# Patient Record
Sex: Female | Born: 1985 | Race: White | Hispanic: No | Marital: Married | Smoking: Never smoker
Health system: Southern US, Community
[De-identification: ages and names within clinical notes are randomized; demographics above are authoritative.]

## PROBLEM LIST (undated history)

## (undated) DIAGNOSIS — K219 Gastro-esophageal reflux disease without esophagitis: Secondary | ICD-10-CM

## (undated) DIAGNOSIS — Z803 Family history of malignant neoplasm of breast: Secondary | ICD-10-CM

## (undated) DIAGNOSIS — E039 Hypothyroidism, unspecified: Secondary | ICD-10-CM

## (undated) DIAGNOSIS — Z8719 Personal history of other diseases of the digestive system: Secondary | ICD-10-CM

## (undated) DIAGNOSIS — E282 Polycystic ovarian syndrome: Secondary | ICD-10-CM

## (undated) DIAGNOSIS — F419 Anxiety disorder, unspecified: Secondary | ICD-10-CM

## (undated) DIAGNOSIS — Z8489 Family history of other specified conditions: Secondary | ICD-10-CM

## (undated) DIAGNOSIS — I10 Essential (primary) hypertension: Secondary | ICD-10-CM

## (undated) HISTORY — DX: Essential (primary) hypertension: I10

## (undated) HISTORY — DX: Polycystic ovarian syndrome: E28.2

## (undated) HISTORY — DX: Family history of malignant neoplasm of breast: Z80.3

## (undated) HISTORY — DX: Gastro-esophageal reflux disease without esophagitis: K21.9

---

## 2015-04-01 DIAGNOSIS — Z3041 Encounter for surveillance of contraceptive pills: Secondary | ICD-10-CM | POA: Insufficient documentation

## 2015-04-01 DIAGNOSIS — E282 Polycystic ovarian syndrome: Secondary | ICD-10-CM | POA: Insufficient documentation

## 2015-04-01 DIAGNOSIS — K219 Gastro-esophageal reflux disease without esophagitis: Secondary | ICD-10-CM | POA: Insufficient documentation

## 2015-04-01 DIAGNOSIS — I10 Essential (primary) hypertension: Secondary | ICD-10-CM | POA: Insufficient documentation

## 2017-04-05 ENCOUNTER — Encounter: Payer: Self-pay | Admitting: Family Medicine

## 2017-04-05 ENCOUNTER — Ambulatory Visit: Payer: BC Managed Care – PPO | Admitting: Family Medicine

## 2017-04-05 ENCOUNTER — Other Ambulatory Visit: Payer: Self-pay

## 2017-04-05 VITALS — BP 130/86 | HR 75 | Temp 98.3°F | Ht 67.0 in | Wt 263.4 lb

## 2017-04-05 DIAGNOSIS — E282 Polycystic ovarian syndrome: Secondary | ICD-10-CM | POA: Diagnosis not present

## 2017-04-05 DIAGNOSIS — Z0000 Encounter for general adult medical examination without abnormal findings: Secondary | ICD-10-CM | POA: Diagnosis not present

## 2017-04-05 DIAGNOSIS — I10 Essential (primary) hypertension: Secondary | ICD-10-CM | POA: Diagnosis not present

## 2017-04-05 DIAGNOSIS — Z803 Family history of malignant neoplasm of breast: Secondary | ICD-10-CM | POA: Diagnosis not present

## 2017-04-05 HISTORY — DX: Family history of malignant neoplasm of breast: Z80.3

## 2017-04-05 LAB — LIPID PANEL
Cholesterol: 185 mg/dL (ref 0–200)
HDL: 31.5 mg/dL — AB (ref >39.00)
NonHDL: 153.64
TRIGLYCERIDES: 211 mg/dL — AB (ref 0.0–149.0)
Total CHOL/HDL Ratio: 6
VLDL: 42.2 mg/dL — ABNORMAL HIGH (ref 0.0–40.0)

## 2017-04-05 LAB — COMPREHENSIVE METABOLIC PANEL
ALT: 34 U/L (ref 0–35)
AST: 21 U/L (ref 0–37)
Albumin: 3.9 g/dL (ref 3.5–5.2)
Alkaline Phosphatase: 64 U/L (ref 39–117)
BILIRUBIN TOTAL: 0.5 mg/dL (ref 0.2–1.2)
BUN: 11 mg/dL (ref 6–23)
CHLORIDE: 100 meq/L (ref 96–112)
CO2: 29 meq/L (ref 19–32)
Calcium: 9 mg/dL (ref 8.4–10.5)
Creatinine, Ser: 0.98 mg/dL (ref 0.40–1.20)
GFR: 70.08 mL/min (ref >60.00)
GLUCOSE: 79 mg/dL (ref 70–99)
Potassium: 4 mEq/L (ref 3.5–5.1)
Sodium: 138 mEq/L (ref 135–145)
Total Protein: 7 g/dL (ref 6.0–8.3)

## 2017-04-05 LAB — TSH: TSH: 3.06 u[IU]/mL (ref 0.35–4.50)

## 2017-04-05 LAB — LDL CHOLESTEROL, DIRECT: LDL DIRECT: 117 mg/dL

## 2017-04-05 LAB — HEMOGLOBIN A1C: HEMOGLOBIN A1C: 5.6 % (ref 4.6–6.5)

## 2017-04-05 MED ORDER — HYDROCHLOROTHIAZIDE 25 MG PO TABS: 25.0000 mg | ORAL_TABLET | Freq: Every day | ORAL | 3 refills | Status: DC

## 2017-04-05 MED ORDER — LISINOPRIL 20 MG PO TABS: 20.0000 mg | ORAL_TABLET | Freq: Every day | ORAL | 3 refills | Status: DC

## 2017-04-05 NOTE — Patient Instructions (Addendum)
It was so good seeing you again! Thank you for establishing with my new practice and allowing me to continue caring for you. It means a lot to me.   Please schedule a follow up appointment with me in 6 months to recheck your blood pressure.   Please do these things to maintain good health!   Exercise at least 30-45 minutes a day,  4-5 days a week.   Eat a low-fat diet with lots of fruits and vegetables, up to 7-9 servings per day.  Drink plenty of water daily. Try to drink 8 8oz glasses per day.  Seatbelts can save your life. Always wear your seatbelt.  Place Smoke Detectors on every level of your home and check batteries every year.  Schedule an appointment with an eye doctor for an eye exam every 1-2 years  Safe sex - use condoms to protect yourself from STDs if you could be exposed to these types of infections. Use birth control if you do not want to become pregnant and are sexually active.  Avoid heavy alcohol use. If you drink, keep it to less than 2 drinks/day and not every day.  Health Care Power of Attorney.  Choose someone you trust that could speak for you if you became unable to speak for yourself.  Depression is common in our stressful world.If you're feeling down or losing interest in things you normally enjoy, please come in for a visit.  If anyone is threatening or hurting you, please get help. Physical or Emotional Violence is never OK.   

## 2017-04-05 NOTE — Progress Notes (Signed)
Subjective  Chief Complaint  Patient presents with  . Establish Care    Previous Patient from [place]  . Annual Exam  . Medication Refill    HCTZ and Lisinopril     HPI: Sabrina White is a 32 y.o. female who presents to [place] [facility] today for a Female Wellness Visit.  She is a former [place] patient and is here to reestablish care with me today.   She also has the concerns and/or needs as listed above in the chief complaint. These will be addressed in addition to the Health Maintenance Visit.   Wellness Visit: annual visit with health maintenance review and exam without Pap   Had female wellness exam in December with normal Pap smear.  She and her obstetrician working on weight loss and started about a week prior to initiation of IUI trials.  She still would like to become pregnant.  Family history of breast cancer: Maternal side is never been genetically tested.  Breast cancer in mom at age 66 premenopausal, and then grandmother when she was older.  She did have breast cancer in her paternal aunt as well and she fortunately was tested negative for the BRCA gene.  Exercising regularly with CrossFit work [employer].  Diet is fair.  Struggles to lose weight due to PCO S and metabolic syndrome.  Lifestyle: Body mass index is 41.25 kg/m. Wt Readings from Last 3 Encounters:  04/05/17 263 lb 6.4 oz (119.5 kg)   Diet: low fat Exercise: frequently, crossfit Need for contraception: No,   Chronic disease management visit and/or acute problem visit: Hypertension f/u: Control is good . Pt reports she is doing well. taking medications as instructed, no medication side effects noted, no TIAs, no chest pain on exertion, no dyspnea on exertion, no swelling of ankles.  Home readings have been normal but she ran out of her medication last week.  Denies adverse effects from his BP medications. Compliance with medication is good.  We discussed the need to start  medications before getting pregnant and she is aware of this.  Likely will change to labetalol prior to IUI.  Patient is agreeable to this.  BP Readings from Last 3 Encounters:  04/05/17 130/86   Wt Readings from Last 3 Encounters:  04/05/17 263 lb 6.4 oz (119.5 kg)    Patient Active Problem List   Diagnosis Date Noted  . Family history of breast cancer in mother 04/05/2017  . Morbid obesity due to excess calories ([place]) 06/24/2015  . Essential hypertension 04/01/2015  . Gastroesophageal reflux disease without esophagitis 04/01/2015  . PCOS (polycystic ovarian syndrome) 04/01/2015   Health Maintenance  Topic Date Due  . HIV Screening  08/26/2000  . PAP SMEAR  02/04/2020  . TETANUS/TDAP  04/27/2020  . INFLUENZA VACCINE  Completed   Immunization History  Administered Date(s) Administered  . Influenza, Seasonal, Injecte, Preservative Fre 01/24/2015  . Tdap 04/28/2010   We updated and reviewed the patient's past history in detail and it is documented below. Allergies: Patient  reports that she drinks alcohol. Past Medical History Patient  has a past medical history of Family history of breast cancer in mother (04/05/2017), GERD (gastroesophageal reflux disease), Hypertension, and PCOS (polycystic ovarian syndrome). Past Surgical History Patient  has no past surgical history on file. Family History family history includes Breast cancer in her maternal grandmother, mother, and paternal aunt; Hypertension in her father. Social History Patient  reports that  has never smoked. she has never  used smokeless tobacco. She reports that she drinks alcohol. She reports that she does not use drugs.  Review of Systems: Constitutional: negative for fever or malaise Ophthalmic: negative for photophobia, double vision or loss of vision Cardiovascular: negative for chest pain, dyspnea on exertion, or new LE swelling Respiratory: negative for SOB or persistent cough Gastrointestinal: negative  for abdominal pain, change in bowel habits or melena Genitourinary: negative for dysuria or gross hematuria, no abnormal uterine bleeding or disharge Musculoskeletal: negative for new gait disturbance or muscular weakness Integumentary: negative for new or persistent rashes, no breast lumps Neurological: negative for TIA or stroke symptoms Psychiatric: negative for SI or delusions Allergic/Immunologic: negative for hives  Patient Care Team    Relationship Specialty Notifications Start End  Leamon Arnt, MD PCP - General Family Medicine  04/05/17     Objective  Vitals: BP 130/86   Pulse 75   Temp 98.3 F (36.8 C)   Ht 5' 7" (1.702 m)   Wt 263 lb 6.4 oz (119.5 kg)   BMI 41.25 kg/m  General:  Well developed, well nourished, no acute distress  Psych:  Alert and orientedx3,normal mood and affect HEENT:  Normocephalic, atraumatic, non-icteric sclera, PERRL, oropharynx is clear without mass or exudate, supple neck without adenopathy, mass or thyromegaly Cardiovascular:  Normal S1, S2, RRR without gallop, rub or murmur, nondisplaced PMI Respiratory:  Good breath sounds bilaterally, CTAB with normal respiratory effort Gastrointestinal: normal bowel sounds, soft, non-tender, no noted masses. No HSM MSK: no deformities, contusions. Joints are without erythema or swelling. Spine and CVA region are nontender Skin:  Warm, no rashes or suspicious lesions noted Neurologic:    Mental status is normal. CN 2-11 are normal. Gross motor and sensory exams are normal. Normal gait. No tremor   Assessment  1. Annual physical exam   2. Essential hypertension   3. PCOS (polycystic ovarian syndrome)   4. Morbid obesity due to excess calories ([place])   5. Family history of breast cancer in mother      Plan  Female Wellness Visit:  Age appropriate Health Maintenance and Prevention measures were discussed with patient. Included topics are cancer screening recommendations, ways to keep healthy (see AVS)  including dietary and exercise recommendations, regular eye and dental care, use of seat belts, and avoidance of moderate alcohol use and tobacco use.  Discussed consideration for high risk breast cancer clinic for genetic testing to discuss her need for genetic testing and for earlier than recommended screening.  Patient to discuss with OB as well.  BMI: discussed patient's BMI and encouraged positive lifestyle modifications to help get to or maintain a target BMI.  Consider Saxenda.  Patient to discuss with OB.  HM needs and immunizations were addressed and ordered. See below for orders. See HM and immunization section for updates.  Routine labs and screening tests ordered including cmp, cbc and lipids where appropriate.  Discussed recommendations regarding Vit D and calcium supplementation (see AVS)  Chronic disease f/u and/or acute problem visit: (deemed necessary to be done in addition to the wellness visit):  Hypertension: Well controlled.  Refilled medications.  We will continue these over the next 6-12 months.  We will need to change when she starts trying to become pregnant.  Follow up: Return in about 6 months (around 10/03/2017) for follow up Hypertension.    Commons side effects, risks, benefits, and alternatives for medications and treatment plan prescribed today were discussed, and the patient expressed understanding of the given instructions.  Patient is instructed to call or message via MyChart if he/she has any questions or concerns regarding our treatment plan. No barriers to understanding were identified. We discussed Red Flag symptoms and signs in detail. Patient expressed understanding regarding what to do in case of urgent or emergency type symptoms.   Medication list was reconciled, printed and provided to the patient in AVS. Patient instructions and summary information was reviewed with the patient as documented in the AVS. This note was prepared with assistance of Dragon  voice recognition software. Occasional wrong-word or sound-a-like substitutions may have occurred due to the inherent limitations of voice recognition software  Orders Placed This Encounter  Procedures  . Comprehensive metabolic panel  . Lipid panel  . HIV antibody  . TSH  . Hemoglobin A1c  . CBC with Differential/Platelet   Meds ordered this encounter  Medications  . lisinopril (PRINIVIL,ZESTRIL) 20 MG tablet    Sig: Take 1 tablet (20 mg total) by mouth daily.    Dispense:  90 tablet    Refill:  3  . hydrochlorothiazide (HYDRODIURIL) 25 MG tablet    Sig: Take 1 tablet (25 mg total) by mouth daily.    Dispense:  90 tablet    Refill:  3

## 2017-04-06 LAB — CBC WITH DIFFERENTIAL/PLATELET
Basophils Absolute: 86 cells/uL (ref 0–200)
Basophils Relative: 0.8 %
EOS ABS: 756 {cells}/uL — AB (ref 15–500)
EOS PCT: 7 %
HCT: 41.4 % (ref 35.0–45.0)
Hemoglobin: 14.2 g/dL (ref 11.7–15.5)
Lymphs Abs: 3218 cells/uL (ref 850–3900)
MCH: 28.3 pg (ref 27.0–33.0)
MCHC: 34.3 g/dL (ref 32.0–36.0)
MCV: 82.6 fL (ref 80.0–100.0)
MONOS PCT: 5.3 %
MPV: 10.2 fL (ref 7.5–12.5)
Neutro Abs: 6167 cells/uL (ref 1500–7800)
Neutrophils Relative %: 57.1 %
PLATELETS: 283 10*3/uL (ref 140–400)
RBC: 5.01 10*6/uL (ref 3.80–5.10)
RDW: 13 % (ref 11.0–15.0)
TOTAL LYMPHOCYTE: 29.8 %
WBC mixed population: 572 cells/uL (ref 200–950)
WBC: 10.8 10*3/uL (ref 3.8–10.8)

## 2017-04-06 LAB — HIV ANTIBODY (ROUTINE TESTING W REFLEX): HIV: NONREACTIVE

## 2017-06-18 ENCOUNTER — Ambulatory Visit: Payer: BC Managed Care – PPO | Admitting: Family Medicine

## 2017-06-18 ENCOUNTER — Other Ambulatory Visit: Payer: Self-pay

## 2017-06-18 ENCOUNTER — Encounter: Payer: Self-pay | Admitting: Family Medicine

## 2017-06-18 VITALS — BP 132/92 | HR 87 | Temp 98.2°F | Ht 67.0 in | Wt 258.2 lb

## 2017-06-18 DIAGNOSIS — J9801 Acute bronchospasm: Secondary | ICD-10-CM | POA: Diagnosis not present

## 2017-06-18 DIAGNOSIS — J208 Acute bronchitis due to other specified organisms: Secondary | ICD-10-CM

## 2017-06-18 DIAGNOSIS — B9689 Other specified bacterial agents as the cause of diseases classified elsewhere: Secondary | ICD-10-CM | POA: Diagnosis not present

## 2017-06-18 MED ORDER — AZITHROMYCIN 250 MG PO TABS: ORAL_TABLET | ORAL | 0 refills | Status: DC

## 2017-06-18 MED ORDER — ALBUTEROL SULFATE HFA 108 (90 BASE) MCG/ACT IN AERS: 2.0000 | INHALATION_SPRAY | RESPIRATORY_TRACT | 2 refills | Status: DC | PRN

## 2017-06-18 NOTE — Progress Notes (Signed)
Subjective  CC:  Chief Complaint  Patient presents with  . Cough    productive cough with yellow mucus, nasal congestion x 5 days     HPI: SUBJECTIVE:  Sabrina White is a 32 y.o. female who complains of congestion, nasal blockage, post nasal drip, cough described as productive and denies sinus, high fevers, SOB, chest pain or significant GI symptoms. She does have chest tightness and some wheezing. Has had WARI in past. Symptoms have been present for 5 days. Started while in [place]: just got married. She denies a history of anorexia, dizziness, vomiting and wheezing. She denies a history of asthma or COPD. Patient does not smoke cigarettes.  I reviewed the patients updated PMH, FH, and SocHx.  Social History: Patient  reports that she has never smoked. She has never used smokeless tobacco. She reports that she drinks alcohol. She reports that she does not use drugs.  Patient Active Problem List   Diagnosis Date Noted  . Family history of breast cancer in mother 04/05/2017  . Morbid obesity due to excess calories ([facility]) 06/24/2015  . Essential hypertension 04/01/2015  . Gastroesophageal reflux disease without esophagitis 04/01/2015  . PCOS (polycystic ovarian syndrome) 04/01/2015    Review of Systems: Cardiovascular: negative for chest pain Respiratory: negative for SOB or hemoptysis Gastrointestinal: negative for abdominal pain Genitourinary: negative for dysuria or gross hematuria Current Meds  Medication Sig  . BELVIQ 10 MG TABS   . hydrochlorothiazide (HYDRODIURIL) 25 MG tablet Take 1 tablet (25 mg total) by mouth daily.  Marland Kitchen lisinopril (PRINIVIL,ZESTRIL) 20 MG tablet Take 1 tablet (20 mg total) by mouth daily.  Marland Kitchen omeprazole (PRILOSEC) 20 MG capsule Take 20 mg by mouth daily.    Objective  Vitals: BP (!) 132/92   Pulse 87   Temp 98.2 F (36.8 C)   Ht 5\' 7"  (1.702 m)   Wt 258 lb 3.2 oz (117.1 kg)   LMP 06/01/2017   BMI 40.44 kg/m  General: no acute distress    Psych:  Alert and oriented, normal mood and affect HEENT:  Normocephalic, atraumatic, supple neck, moist mucous membranes, mildly erythematous pharynx without exudate, mild lymphadenopathy, supple neck Cardiovascular:  RRR without murmur. no edema Respiratory:  Good breath sounds bilaterally, CTAB with normal respiratory effort with occasional rhonchi and mild expiratory wheeze at left base,no rales Skin:  Warm, no rashes Neurologic:   Mental status is normal. normal gait  Assessment  1. Acute bacterial bronchitis   2. Bronchospasm      Plan  Discussion:  Treat for bacterial bronchitis due to prolonged course and worsening symptoms. Add inhaler as needed.  Education regarding differences between viral and bacterial infections and treatment options are discussed.  Supportive care measures are recommended.  We discussed the use of mucolytic's, decongestants, antihistamines and antitussives as needed.  Tylenol or Advil are recommended if needed.  Follow up: Return if symptoms worsen or fail to improve.   Commons side effects, risks, benefits, and alternatives for medications and treatment plan prescribed today were discussed, and the patient expressed understanding of the given instructions. Patient is instructed to call or message via MyChart if he/she has any questions or concerns regarding our treatment plan. No barriers to understanding were identified. We discussed Red Flag symptoms and signs in detail. Patient expressed understanding regarding what to do in case of urgent or emergency type symptoms.  Medication list was reconciled, printed and provided to the patient in AVS. Patient instructions and summary information was  reviewed with the patient as documented in the AVS. This note was prepared with assistance of Dragon voice recognition software. Occasional wrong-word or sound-a-like substitutions may have occurred due to the inherent limitations of voice recognition software  No orders  of the defined types were placed in this encounter.  Meds ordered this encounter  Medications  . azithromycin (ZITHROMAX) 250 MG tablet    Sig: Take 2 tabs today, then 1 tab daily for 4 days    Dispense:  1 each    Refill:  0  . albuterol (PROVENTIL HFA;VENTOLIN HFA) 108 (90 Base) MCG/ACT inhaler    Sig: Inhale 2 puffs into the lungs every 4 (four) hours as needed for wheezing or shortness of breath.    Dispense:  1 Inhaler    Refill:  2

## 2017-06-18 NOTE — Patient Instructions (Addendum)
Please follow up if symptoms do not improve or as needed.   You may use Delsym cough syrup or Mucinex DM to help with congestion and coughing.   Acute Bronchitis, Adult Acute bronchitis is sudden (acute) swelling of the air tubes (bronchi) in the lungs. Acute bronchitis causes these tubes to fill with mucus, which can make it hard to breathe. It can also cause coughing or wheezing. In adults, acute bronchitis usually goes away within 2 weeks. A cough caused by bronchitis may last up to 3 weeks. Smoking, allergies, and asthma can make the condition worse. Repeated episodes of bronchitis may cause further lung problems, such as chronic obstructive pulmonary disease (COPD). What are the causes? This condition can be caused by germs and by substances that irritate the lungs, including:  Cold and flu viruses. This condition is most often caused by the same virus that causes a cold.  Bacteria.  Exposure to tobacco smoke, dust, fumes, and air pollution.  What increases the risk? This condition is more likely to develop in people who:  Have close contact with someone with acute bronchitis.  Are exposed to lung irritants, such as tobacco smoke, dust, fumes, and vapors.  Have a weak immune system.  Have a respiratory condition such as asthma.  What are the signs or symptoms? Symptoms of this condition include:  A cough.  Coughing up clear, yellow, or green mucus.  Wheezing.  Chest congestion.  Shortness of breath.  A fever.  Body aches.  Chills.  A sore throat.  How is this diagnosed? This condition is usually diagnosed with a physical exam. During the exam, your health care provider may order tests, such as chest X-rays, to rule out other conditions. He or she may also:  Test a sample of your mucus for bacterial infection.  Check the level of oxygen in your blood. This is done to check for pneumonia.  Do a chest X-ray or lung function testing to rule out pneumonia and  other conditions.  Perform blood tests.  Your health care provider will also ask about your symptoms and medical history. How is this treated? Most cases of acute bronchitis clear up over time without treatment. Your health care provider may recommend:  Drinking more fluids. Drinking more makes your mucus thinner, which may make it easier to breathe.  Taking a medicine for a fever or cough.  Taking an antibiotic medicine.  Using an inhaler to help improve shortness of breath and to control a cough.  Using a cool mist vaporizer or humidifier to make it easier to breathe.  Follow these instructions at home: Medicines  Take over-the-counter and prescription medicines only as told by your health care provider.  If you were prescribed an antibiotic, take it as told by your health care provider. Do not stop taking the antibiotic even if you start to feel better. General instructions  Get plenty of rest.  Drink enough fluids to keep your urine clear or pale yellow.  Avoid smoking and secondhand smoke. Exposure to cigarette smoke or irritating chemicals will make bronchitis worse. If you smoke and you need help quitting, ask your health care provider. Quitting smoking will help your lungs heal faster.  Use an inhaler, cool mist vaporizer, or humidifier as told by your health care provider.  Keep all follow-up visits as told by your health care provider. This is important. How is this prevented? To lower your risk of getting this condition again:  Wash your hands often with soap   and water. If soap and water are not available, use hand sanitizer.  Avoid contact with people who have cold symptoms.  Try not to touch your hands to your mouth, nose, or eyes.  Make sure to get the flu shot every year.  Contact a health care provider if:  Your symptoms do not improve in 2 weeks of treatment. Get help right away if:  You cough up blood.  You have chest pain.  You have severe  shortness of breath.  You become dehydrated.  You faint or keep feeling like you are going to faint.  You keep vomiting.  You have a severe headache.  Your fever or chills gets worse. This information is not intended to replace advice given to you by your health care provider. Make sure you discuss any questions you have with your health care provider. Document Released: 03/19/2004 Document Revised: 09/04/2015 Document Reviewed: 07/31/2015 Elsevier Interactive Patient Education  2018 [employer]vier Inc.   

## 2017-06-30 ENCOUNTER — Other Ambulatory Visit: Payer: Self-pay | Admitting: Family Medicine

## 2017-06-30 NOTE — Telephone Encounter (Signed)
Request for omeprazole (20 mg tab) according to med list. This med has a historical provider.  LOV  (annual) 04/05/17 LOV  04/26  Provider Dr. Mardelle [facility] [address]  Please review.

## 2017-06-30 NOTE — Telephone Encounter (Signed)
Copied from CRM [id number]. Topic: Quick Communication - See Telephone Encounter >> Jun 30, 2017  9:12 AM Waymon Amato wrote: Pt is needing a refill on omeprazole  [facility] [address] [phone]   Best number [phone]

## 2017-07-01 MED ORDER — OMEPRAZOLE 20 MG PO CPDR: 20.0000 mg | DELAYED_RELEASE_CAPSULE | Freq: Every day | ORAL | 3 refills | Status: DC

## 2017-07-01 NOTE — Telephone Encounter (Signed)
Received and reviewed medication refill request.  Request is appropriate and was approved.  Please see medication orders for details.  

## 2017-09-02 ENCOUNTER — Ambulatory Visit: Payer: BC Managed Care – PPO | Admitting: Family Medicine

## 2017-10-21 ENCOUNTER — Ambulatory Visit: Payer: BC Managed Care – PPO | Admitting: Family Medicine

## 2017-10-21 ENCOUNTER — Encounter: Payer: Self-pay | Admitting: Family Medicine

## 2017-10-21 ENCOUNTER — Other Ambulatory Visit: Payer: Self-pay

## 2017-10-21 VITALS — BP 132/86 | HR 88 | Temp 98.4°F | Ht 67.0 in | Wt 255.6 lb

## 2017-10-21 DIAGNOSIS — Z23 Encounter for immunization: Secondary | ICD-10-CM

## 2017-10-21 DIAGNOSIS — E282 Polycystic ovarian syndrome: Secondary | ICD-10-CM | POA: Diagnosis not present

## 2017-10-21 DIAGNOSIS — I10 Essential (primary) hypertension: Secondary | ICD-10-CM

## 2017-10-21 MED ORDER — LABETALOL HCL 100 MG PO TABS: 100.0000 mg | ORAL_TABLET | Freq: Two times a day (BID) | ORAL | 5 refills | Status: DC

## 2017-10-21 NOTE — Patient Instructions (Addendum)
Please follow up if symptoms do not improve or as needed.  Follow up in 4 weeks if we need to adjust your blood pressure medications  Check your blood pressures: goal is 120s/70s.  Watch the documentary: eating you alive and fed up

## 2017-10-21 NOTE — Progress Notes (Signed)
Subjective  CC:  Chief Complaint  Patient presents with  . Hypertension    Patient states her OB-Gyn wants to switch to [facility] [place] states she is trying to conceive.     HPI: Sabrina White is a 31 y.o. female who presents to the office today to address the problems listed above in the chief complaint. Hypertension f/u: Control has been good, however patient is trying to conceive.  She needs her medications to be changed to safe medications for pregnancy. She is working on weight loss with a healthy diet. She is on metformin for her PCOS to try to get her to ambulate.  She is hoping to start IUI trials soon.  .Assessment  1. Essential hypertension   2. PCOS (polycystic ovarian syndrome)   3. Need for influenza vaccination      Plan    Hypertension f/u: BP control is fairly well controlled.  However will change to labetalol 100 twice daily and monitor home blood pressures.  Will titrate medications up within the next 1 to 3 months to get her controlled prior to pregnancy.  Discussed diet and weight loss recommendations.  Continue multivitamin and metformin.  Education regarding management of these chronic disease states was given. Management strategies discussed on successive visits include dietary and exercise recommendations, goals of achieving and maintaining IBW, and lifestyle modifications aiming for adequate sleep and minimizing stressors.   Follow up: 4 weeks as needed for medication titration  Orders Placed This Encounter  Procedures  . Flu Vaccine QUAD 36+ mos IM   Meds ordered this encounter  Medications  . labetalol (NORMODYNE) 100 MG tablet    Sig: Take 1 tablet (100 mg total) by mouth 2 (two) times daily.    Dispense:  60 tablet    Refill:  5      BP Readings from Last 3 Encounters:  10/21/17 132/86  06/18/17 (!) 132/92  04/05/17 130/86   Wt Readings from Last 3 Encounters:  10/21/17 255 lb 9.6 oz (115.9 kg)  06/18/17 258 lb 3.2 oz (117.1 kg)  04/05/17  263 lb 6.4 oz (119.5 kg)    Lab Results  Component Value Date   CHOL 185 04/05/2017   Lab Results  Component Value Date   HDL 31.50 (L) 04/05/2017   No results found for: [facility] Lab Results  Component Value Date   TRIG 211.0 (H) 04/05/2017   Lab Results  Component Value Date   CHOLHDL 6 04/05/2017   Lab Results  Component Value Date   LDLDIRECT 117.0 04/05/2017   Lab Results  Component Value Date   CREATININE 0.98 04/05/2017   BUN 11 04/05/2017   NA 138 04/05/2017   K 4.0 04/05/2017   CL 100 04/05/2017   CO2 29 04/05/2017    The ASCVD Risk score (Goff DC Jr., et al., 2013) failed to calculate for the following reasons:   The 2013 ASCVD risk score is only valid for ages 41 to 70  I reviewed the patients updated PMH, FH, and SocHx.    Patient Active Problem List   Diagnosis Date Noted  . Family history of breast cancer in mother 04/05/2017  . Morbid obesity due to excess calories (HCC) 06/24/2015  . Benign hypertension 04/01/2015  . Gastroesophageal reflux disease without esophagitis 04/01/2015  . PCOS (polycystic ovarian syndrome) 04/01/2015    Allergies: Codeine  Social History: Patient  reports that she has never smoked. She has never used smokeless tobacco. She reports that she drinks alcohol. She reports  that she does not use drugs.  Current Meds  Medication Sig  . omeprazole (PRILOSEC) 20 MG capsule Take 1 capsule (20 mg total) by mouth daily.  . [DISCONTINUED] hydrochlorothiazide (HYDRODIURIL) 25 MG tablet Take 1 tablet (25 mg total) by mouth daily.  . [DISCONTINUED] lisinopril (PRINIVIL,ZESTRIL) 20 MG tablet Take 1 tablet (20 mg total) by mouth daily.    Review of Systems: Cardiovascular: negative for chest pain, palpitations, leg swelling, orthopnea Respiratory: negative for SOB, wheezing or persistent cough Gastrointestinal: negative for abdominal pain Genitourinary: negative for dysuria or gross hematuria  Objective  Vitals: BP 132/86    Pulse 88   Temp 98.4 F (36.9 C)   Ht 5\' 7"  (1.702 m)   Wt 255 lb 9.6 oz (115.9 kg)   SpO2 98%   BMI 40.03 kg/m  General: no acute distress  Psych:  Alert and oriented, normal mood and affect HEENT:  Normocephalic, atraumatic, supple neck  Cardiovascular:  RRR without murmur. no edema Respiratory:  Good breath sounds bilaterally, CTAB with normal respiratory effort Skin:  Warm, no rashes Neurologic:   Mental status is normal  Commons side effects, risks, benefits, and alternatives for medications and treatment plan prescribed today were discussed, and the patient expressed understanding of the given instructions. Patient is instructed to call or message via MyChart if he/she has any questions or concerns regarding our treatment plan. No barriers to understanding were identified. We discussed Red Flag symptoms and signs in detail. Patient expressed understanding regarding what to do in case of urgent or emergency type symptoms.   Medication list was reconciled, printed and provided to the patient in AVS. Patient instructions and summary information was reviewed with the patient as documented in the AVS. This note was prepared with assistance of Dragon voice recognition software. Occasional wrong-word or sound-a-like substitutions may have occurred due to the inherent limitations of voice recognition software

## 2017-11-11 ENCOUNTER — Other Ambulatory Visit: Payer: Self-pay

## 2017-11-11 ENCOUNTER — Encounter: Payer: Self-pay | Admitting: Family Medicine

## 2017-11-11 ENCOUNTER — Ambulatory Visit: Payer: BC Managed Care – PPO | Admitting: Family Medicine

## 2017-11-11 VITALS — BP 138/100 | HR 71 | Temp 98.7°F | Ht 67.0 in | Wt 256.0 lb

## 2017-11-11 DIAGNOSIS — I10 Essential (primary) hypertension: Secondary | ICD-10-CM

## 2017-11-11 MED ORDER — LABETALOL HCL 200 MG PO TABS: 200.0000 mg | ORAL_TABLET | Freq: Two times a day (BID) | ORAL | 3 refills | Status: DC

## 2017-11-11 MED ORDER — LABETALOL HCL 100 MG PO TABS: 100.0000 mg | ORAL_TABLET | Freq: Two times a day (BID) | ORAL | 5 refills | Status: DC

## 2017-11-11 NOTE — Patient Instructions (Signed)
  If you have any questions or concerns, please don't hesitate to send me a message via MyChart or call the office at [phone]. Thank you for visiting with us today! It's our pleasure caring for you.  

## 2017-11-11 NOTE — Progress Notes (Signed)
Subjective  CC:  Chief Complaint  Patient presents with  . Hypertension    BP's have been high, 120/100's   . Headache    x 2 days     HPI: Sabrina White is a 32 y.o. female who presents to the office today to address the problems listed above in the chief complaint. Hypertension f/u: Patient reports tolerating labetalol twice daily without adverse effects.  She is been monitoring her blood pressure regularly, systolics are well controlled with diastolics running in the 100s.  She has a dull headache which she associates with her hypertension.  She has focus in the past.  No fatigue or shortness of breath with beta-blocker.  She has seen her OB and they are planning to start hormonal treatment for trial of IUI soon.  Assessment  1. Benign hypertension      Plan    Hypertension f/u: BP control is poorly controlled.  Will double labetalol dose to 200 mg twice daily.  Education given.  Continue home monitoring.  Check in with me via my chart in 3 to 4 weeks to let me know how things are running.  Education regarding management of these chronic disease states was given. Management strategies discussed on successive visits include dietary and exercise recommendations, goals of achieving and maintaining IBW, and lifestyle modifications aiming for adequate sleep and minimizing stressors.   Follow up: No follow-ups on file.  No orders of the defined types were placed in this encounter.  Meds ordered this encounter  Medications  . DISCONTD: labetalol (NORMODYNE) 100 MG tablet    Sig: Take 1 tablet (100 mg total) by mouth 2 (two) times daily.    Dispense:  60 tablet    Refill:  5  . labetalol (NORMODYNE) 200 MG tablet    Sig: Take 1 tablet (200 mg total) by mouth 2 (two) times daily.    Dispense:  180 tablet    Refill:  3    Please disregard refill for 100mg  dose; we are increasing the dosage now.      BP Readings from Last 3 Encounters:  11/11/17 (!) 138/100  10/21/17 132/86    06/18/17 (!) 132/92   Wt Readings from Last 3 Encounters:  11/11/17 256 lb (116.1 kg)  10/21/17 255 lb 9.6 oz (115.9 kg)  06/18/17 258 lb 3.2 oz (117.1 kg)    Lab Results  Component Value Date   CHOL 185 04/05/2017   Lab Results  Component Value Date   HDL 31.50 (L) 04/05/2017   No results found for: [facility] Lab Results  Component Value Date   TRIG 211.0 (H) 04/05/2017   Lab Results  Component Value Date   CHOLHDL 6 04/05/2017   Lab Results  Component Value Date   LDLDIRECT 117.0 04/05/2017   Lab Results  Component Value Date   CREATININE 0.98 04/05/2017   BUN 11 04/05/2017   NA 138 04/05/2017   K 4.0 04/05/2017   CL 100 04/05/2017   CO2 29 04/05/2017    The ASCVD Risk score (Goff DC Jr., et al., 2013) failed to calculate for the following reasons:   The 2013 ASCVD risk score is only valid for ages 10 to 27  I reviewed the patients updated PMH, FH, and SocHx.    Patient Active Problem List   Diagnosis Date Noted  . Family history of breast cancer in mother 04/05/2017  . Morbid obesity due to excess calories (HCC) 06/24/2015  . Benign hypertension 04/01/2015  . Gastroesophageal  reflux disease without esophagitis 04/01/2015  . PCOS (polycystic ovarian syndrome) 04/01/2015    Allergies: Codeine  Social History: Patient  reports that she has never smoked. She has never used smokeless tobacco. She reports that she drinks alcohol. She reports that she does not use drugs.  Current Meds  Medication Sig  . labetalol (NORMODYNE) 200 MG tablet Take 1 tablet (200 mg total) by mouth 2 (two) times daily.  . Liraglutide -Weight Management (SAXENDA) 18 MG/3ML SOPN Saxenda 3 mg/0.5 mL (18 mg/3 mL) subcutaneous pen injector  . metFORMIN (GLUCOPHAGE) 500 MG tablet metformin 500 mg tablet  . omeprazole (PRILOSEC) 20 MG capsule Take 1 capsule (20 mg total) by mouth daily.  . [DISCONTINUED] labetalol (NORMODYNE) 100 MG tablet Take 1 tablet (100 mg total) by mouth 2 (two)  times daily.  . [DISCONTINUED] labetalol (NORMODYNE) 100 MG tablet Take 1 tablet (100 mg total) by mouth 2 (two) times daily.    Review of Systems: Cardiovascular: negative for chest pain, palpitations, leg swelling, orthopnea Respiratory: negative for SOB, wheezing or persistent cough Gastrointestinal: negative for abdominal pain Genitourinary: negative for dysuria or gross hematuria  Objective  Vitals: BP (!) 138/100   Pulse 71   Temp 98.7 F (37.1 C)   Ht 5\' 7"  (1.702 m)   Wt 256 lb (116.1 kg)   SpO2 98%   BMI 40.10 kg/m   General: no acute distress  Psych:  Alert and oriented, normal mood and affect HEENT:  Normocephalic, atraumatic, supple neck  Cardiovascular:  RRR without murmur. no edema Respiratory:  Good breath sounds bilaterally, CTAB with normal respiratory effort Skin:  Warm, no rashes Neurologic:   Mental status is normal  Commons side effects, risks, benefits, and alternatives for medications and treatment plan prescribed today were discussed, and the patient expressed understanding of the given instructions. Patient is instructed to call or message via MyChart if he/she has any questions or concerns regarding our treatment plan. No barriers to understanding were identified. We discussed Red Flag symptoms and signs in detail. Patient expressed understanding regarding what to do in case of urgent or emergency type symptoms.   Medication list was reconciled, printed and provided to the patient in AVS. Patient instructions and summary information was reviewed with the patient as documented in the AVS. This note was prepared with assistance of Dragon voice recognition software. Occasional wrong-word or sound-a-like substitutions may have occurred due to the inherent limitations of voice recognition software

## 2018-04-06 ENCOUNTER — Other Ambulatory Visit: Payer: Self-pay

## 2018-04-06 ENCOUNTER — Ambulatory Visit: Payer: BC Managed Care – PPO | Admitting: Family Medicine

## 2018-04-06 ENCOUNTER — Encounter: Payer: Self-pay | Admitting: Family Medicine

## 2018-04-06 VITALS — BP 120/84 | HR 84 | Temp 99.0°F | Resp 16 | Ht 67.0 in | Wt 268.6 lb

## 2018-04-06 DIAGNOSIS — K219 Gastro-esophageal reflux disease without esophagitis: Secondary | ICD-10-CM | POA: Diagnosis not present

## 2018-04-06 DIAGNOSIS — I10 Essential (primary) hypertension: Secondary | ICD-10-CM | POA: Diagnosis not present

## 2018-04-06 LAB — H. PYLORI ANTIBODY, IGG: H Pylori IgG: NEGATIVE

## 2018-04-06 MED ORDER — OMEPRAZOLE 20 MG PO CPDR: 20.0000 mg | DELAYED_RELEASE_CAPSULE | Freq: Every day | ORAL | 3 refills | Status: DC

## 2018-04-06 NOTE — Progress Notes (Signed)
Subjective  CC:  Chief Complaint  Patient presents with  . Medication Problem    She reports that Omeprazole is not working anymore, wants to discuss other TXs    HPI: Sabrina White is a 33 y.o. female who presents to the office today to address the problems listed above in the chief complaint.  GERD: over last 2 weeks with daily GERD sxs w/o hematemesis or n/v. ? Due to change in diet.   HTN: has been well controlled. Undergoing cycle 4 of fertility treatment. bp has been closely monitoring.    Assessment  1. Gastroesophageal reflux disease without esophagitis   2. Benign hypertension      Plan   GERD:  Check h.pylori and increase PPI dose to get controlled again  HTN is controlled   Follow up: Return in about 3 months (around 07/05/2018) for complete physical.  Visit date not found  Orders Placed This Encounter  Procedures  . H. pylori antibody, IgG   Meds ordered this encounter  Medications  . omeprazole (PRILOSEC) 20 MG capsule    Sig: Take 1 capsule (20 mg total) by mouth daily.    Dispense:  90 capsule    Refill:  3      I reviewed the patients updated PMH, FH, and SocHx.    Patient Active Problem List   Diagnosis Date Noted  . Family history of breast cancer in mother 04/05/2017  . Morbid obesity due to excess calories (HCC) 06/24/2015  . Benign hypertension 04/01/2015  . Gastroesophageal reflux disease without esophagitis 04/01/2015  . PCOS (polycystic ovarian syndrome) 04/01/2015   Current Meds  Medication Sig  . albuterol (PROVENTIL HFA;VENTOLIN HFA) 108 (90 Base) MCG/ACT inhaler Inhale 2 puffs into the lungs every 4 (four) hours as needed for wheezing or shortness of breath.  . labetalol (NORMODYNE) 200 MG tablet Take 1 tablet (200 mg total) by mouth 2 (two) times daily.  . metFORMIN (GLUCOPHAGE) 500 MG tablet metformin 500 mg tablet  . omeprazole (PRILOSEC) 20 MG capsule Take 1 capsule (20 mg total) by mouth daily.  . [DISCONTINUED]  Liraglutide -Weight Management (SAXENDA) 18 MG/3ML SOPN Saxenda 3 mg/0.5 mL (18 mg/3 mL) subcutaneous pen injector  . [DISCONTINUED] omeprazole (PRILOSEC) 20 MG capsule Take 1 capsule (20 mg total) by mouth daily.    Allergies: Patient is allergic to codeine. Family History: Patient family history includes Breast cancer in her maternal grandmother, mother, and paternal aunt; Hypertension in her father. Social History:  Patient  reports that she has never smoked. She has never used smokeless tobacco. She reports current alcohol use. She reports that she does not use drugs.  Review of Systems: Constitutional: Negative for fever malaise or anorexia Cardiovascular: negative for chest pain Respiratory: negative for SOB or persistent cough Gastrointestinal: negative for abdominal pain  Objective  Vitals: BP 120/84   Pulse 84   Temp 99 F (37.2 C) (Oral)   Resp 16   Ht 5\' 7"  (1.702 m)   Wt 268 lb 9.6 oz (121.8 kg)   LMP 03/05/2018   SpO2 98%   BMI 42.07 kg/m  General: no acute distress , A&Ox3 HEENT: PEERL, conjunctiva normal, Oropharynx moist,neck is supple Cardiovascular:  RRR without murmur or gallop.  Respiratory:  Good breath sounds bilaterally, CTAB with normal respiratory effort Skin:  Warm, no rashes Gastrointestinal: soft, flat abdomen, normal active bowel sounds, no palpable masses, no hepatosplenomegaly, no appreciated hernias      Commons side effects, risks, benefits, and  alternatives for medications and treatment plan prescribed today were discussed, and the patient expressed understanding of the given instructions. Patient is instructed to call or message via MyChart if he/she has any questions or concerns regarding our treatment plan. No barriers to understanding were identified. We discussed Red Flag symptoms and signs in detail. Patient expressed understanding regarding what to do in case of urgent or emergency type symptoms.   Medication list was reconciled,  printed and provided to the patient in AVS. Patient instructions and summary information was reviewed with the patient as documented in the AVS. This note was prepared with assistance of Dragon voice recognition software. Occasional wrong-word or sound-a-like substitutions may have occurred due to the inherent limitations of voice recognition software

## 2018-04-06 NOTE — Patient Instructions (Signed)
Please return in 1-3 months for your annual complete physical; please come fasting.   If you have any questions or concerns, please don't hesitate to send me a message via MyChart or call the office at [phone]. Thank you for visiting with [place] today! It's our pleasure caring for you.  Please increase the omeprazole to 20mg  twice a day for the next 6-12 weeks to calm down the reflux.

## 2018-04-06 NOTE — Progress Notes (Signed)
Please call patient: I have reviewed his/her lab results. The bacteria test, H.pylori is negative. Thus, I'm hoping increasing the dose of the omeprazole should get her controlled again. Thanks.

## 2018-04-07 ENCOUNTER — Encounter: Payer: Self-pay | Admitting: Family Medicine

## 2018-07-14 ENCOUNTER — Other Ambulatory Visit: Payer: Self-pay

## 2018-07-14 ENCOUNTER — Ambulatory Visit (INDEPENDENT_AMBULATORY_CARE_PROVIDER_SITE_OTHER): Payer: BC Managed Care – PPO | Admitting: Family Medicine

## 2018-07-14 ENCOUNTER — Encounter: Payer: Self-pay | Admitting: Family Medicine

## 2018-07-14 VITALS — Wt 265.0 lb

## 2018-07-14 DIAGNOSIS — H60549 Acute eczematoid otitis externa, unspecified ear: Secondary | ICD-10-CM | POA: Insufficient documentation

## 2018-07-14 DIAGNOSIS — H60543 Acute eczematoid otitis externa, bilateral: Secondary | ICD-10-CM

## 2018-07-14 MED ORDER — CIPROFLOXACIN-DEXAMETHASONE 0.3-0.1 % OT SUSP: 4.0000 [drp] | Freq: Two times a day (BID) | OTIC | 0 refills | Status: DC

## 2018-07-14 NOTE — Progress Notes (Signed)
Virtual Visit via Video Note  Subjective  CC:  Chief Complaint  Patient presents with  . Ear Pain    Right ear worse, started 3 days ago.Marland Kitchen Hearing muffled, drainging, and painful. She has not tried anything but has a steroid drop that she uses once a week     I connected with Thurman Coyer on 07/14/18 at  8:20 AM EDT by a video enabled telemedicine application and verified that I am speaking with the correct person using two identifiers. Location patient: Home Location provider: [facility] [address] Office Persons participating in the virtual visit: Stephnie Slowik, Willow Ora, MD Rita Ohara, CMA  I discussed the limitations of evaluation and management by telemedicine and the availability of in person appointments. The patient expressed understanding and agreed to proceed. HPI: Sabrina White is a 33 y.o. female who was contacted today to address the problems listed above in the chief complaint. . Bilateral ear pain, as above. 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 Uses an oil prescribed by ENT in the past. Occasionally will get infections. Has pain and drainage and swelling and decreased hearing. No uri sxs: no fever or cough or nasal congestion or sinus pressure.  . Starting IVF treatments soon. Is excited. bp has been well controlled.   Assessment  1. Acute eczematoid otitis externa of both ears   2. Eczema of both external ears      Plan   Otitis extrena, eczema:  ciprodex bid x 7 days. F/u if not improving. advil for pain.   I discussed the assessment and treatment plan with the patient. The patient was provided an opportunity to ask questions and all were answered. The patient agreed with the plan and demonstrated an understanding of the instructions.   The patient was advised to call back or seek an in-person evaluation if the symptoms worsen or if the condition fails to improve as anticipated. Follow up: Return for complete physical.   Visit date not found  Meds ordered this encounter  Medications  . ciprofloxacin-dexamethasone (CIPRODEX) OTIC suspension    Sig: Place 4 drops into both ears 2 (two) times daily.    Dispense:  7.5 mL    Refill:  0      I reviewed the patients updated PMH, FH, and SocHx.    Patient Active Problem List   Diagnosis Date Noted  . 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 Family history of breast cancer in mother 04/05/2017  . Morbid obesity due to excess calories (HCC) 06/24/2015  . Benign hypertension 04/01/2015  . Gastroesophageal reflux disease without esophagitis 04/01/2015  . PCOS (polycystic ovarian syndrome) 04/01/2015   Current Meds  Medication Sig  . albuterol (PROVENTIL HFA;VENTOLIN HFA) 108 (90 Base) MCG/ACT inhaler Inhale 2 puffs into the lungs every 4 (four) hours as needed for wheezing or shortness of breath.  . fluocinonide (LIDEX) 0.05 % external solution Apply 1 application topically once a week.  . labetalol (NORMODYNE) 200 MG tablet Take 1 tablet (200 mg total) by mouth 2 (two) times daily.  . metFORMIN (GLUCOPHAGE) 500 MG tablet metformin 500 mg tablet  . omeprazole (PRILOSEC) 20 MG capsule Take 1 capsule (20 mg total) by mouth daily.    Allergies: Patient is allergic to codeine. Family History: Patient family history includes Breast cancer in her maternal grandmother, mother, and paternal aunt; Hypertension in her father. Social History:  Patient  reports that she has never smoked. She  has never used smokeless tobacco. She reports current alcohol use. She reports that she does not use drugs.  Review of Systems: Constitutional: Negative for fever malaise or anorexia Cardiovascular: negative for chest pain Respiratory: negative for SOB or persistent cough Gastrointestinal: negative for abdominal pain  OBJECTIVE Vitals: Wt 265 lb (120.2 kg)   BMI 41.50 kg/m  General: no acute distress , A&Ox3  Willow Oraamille L Evaline Waltman, MD

## 2018-07-14 NOTE — Patient Instructions (Signed)
Please return for your annual complete physical; please come fasting.   If you have any questions or concerns, please don't hesitate to send me a message via MyChart or call the office at [phone]. Thank you for visiting with us today! It's our pleasure caring for you.   

## 2018-09-13 ENCOUNTER — Encounter: Payer: Self-pay | Admitting: Family Medicine

## 2018-09-14 MED ORDER — OMEPRAZOLE 20 MG PO CPDR: 20.0000 mg | DELAYED_RELEASE_CAPSULE | Freq: Two times a day (BID) | ORAL | 3 refills | Status: DC

## 2018-12-06 LAB — OB RESULTS CONSOLE GC/CHLAMYDIA
Chlamydia: NEGATIVE
Gonorrhea: NEGATIVE

## 2018-12-06 LAB — OB RESULTS CONSOLE ANTIBODY SCREEN: Antibody Screen: NEGATIVE

## 2018-12-06 LAB — OB RESULTS CONSOLE RPR: RPR: NONREACTIVE

## 2018-12-06 LAB — OB RESULTS CONSOLE HIV ANTIBODY (ROUTINE TESTING): HIV: NONREACTIVE

## 2018-12-06 LAB — OB RESULTS CONSOLE ABO/RH: RH Type: NEGATIVE

## 2018-12-06 LAB — OB RESULTS CONSOLE RUBELLA ANTIBODY, IGM: Rubella: IMMUNE

## 2018-12-06 LAB — OB RESULTS CONSOLE HEPATITIS B SURFACE ANTIGEN: Hepatitis B Surface Ag: NEGATIVE

## 2019-03-22 ENCOUNTER — Other Ambulatory Visit: Payer: Self-pay

## 2019-03-22 ENCOUNTER — Encounter (HOSPITAL_COMMUNITY): Payer: Self-pay | Admitting: Obstetrics

## 2019-03-22 ENCOUNTER — Observation Stay (HOSPITAL_COMMUNITY)
Admission: AD | Admit: 2019-03-22 | Discharge: 2019-03-24 | Disposition: A | Discharge status: Home / Self Care | Payer: BC Managed Care – PPO | Attending: Obstetrics | Admitting: Obstetrics

## 2019-03-22 DIAGNOSIS — R519 Headache, unspecified: Secondary | ICD-10-CM | POA: Diagnosis not present

## 2019-03-22 DIAGNOSIS — Z3A26 26 weeks gestation of pregnancy: Secondary | ICD-10-CM | POA: Diagnosis not present

## 2019-03-22 DIAGNOSIS — Z20822 Contact with and (suspected) exposure to covid-19: Secondary | ICD-10-CM | POA: Insufficient documentation

## 2019-03-22 DIAGNOSIS — O10919 Unspecified pre-existing hypertension complicating pregnancy, unspecified trimester: Secondary | ICD-10-CM

## 2019-03-22 DIAGNOSIS — O99891 Other specified diseases and conditions complicating pregnancy: Secondary | ICD-10-CM | POA: Diagnosis present

## 2019-03-22 LAB — CBC
HCT: 35.2 % — ABNORMAL LOW (ref 36.0–46.0)
Hemoglobin: 12.1 g/dL (ref 12.0–15.0)
MCH: 28.6 pg (ref 26.0–34.0)
MCHC: 34.4 g/dL (ref 30.0–36.0)
MCV: 83.2 fL (ref 80.0–100.0)
Platelets: 237 10*3/uL (ref 150–400)
RBC: 4.23 MIL/uL (ref 3.87–5.11)
RDW: 13.3 % (ref 11.5–15.5)
WBC: 11.5 10*3/uL — ABNORMAL HIGH (ref 4.0–10.5)
nRBC: 0 % (ref 0.0–0.2)

## 2019-03-22 LAB — COMPREHENSIVE METABOLIC PANEL
ALT: 21 U/L (ref 0–44)
AST: 16 U/L (ref 15–41)
Albumin: 2.6 g/dL — ABNORMAL LOW (ref 3.5–5.0)
Alkaline Phosphatase: 64 U/L (ref 38–126)
Anion gap: 9 (ref 5–15)
BUN: 5 mg/dL — ABNORMAL LOW (ref 6–20)
CO2: 21 mmol/L — ABNORMAL LOW (ref 22–32)
Calcium: 9.1 mg/dL (ref 8.9–10.3)
Chloride: 105 mmol/L (ref 98–111)
Creatinine, Ser: 0.66 mg/dL (ref 0.44–1.00)
GFR calc Af Amer: >60 mL/min (ref >60)
GFR calc non Af Amer: >60 mL/min (ref >60)
Glucose, Bld: 83 mg/dL (ref 70–99)
Potassium: 4 mmol/L (ref 3.5–5.1)
Sodium: 135 mmol/L (ref 135–145)
Total Bilirubin: 0.5 mg/dL (ref 0.3–1.2)
Total Protein: 6.1 g/dL — ABNORMAL LOW (ref 6.5–8.1)

## 2019-03-22 LAB — TYPE AND SCREEN
ABO/RH(D): A NEG
Antibody Screen: NEGATIVE

## 2019-03-22 LAB — ABO/RH: ABO/RH(D): A NEG

## 2019-03-22 LAB — SARS CORONAVIRUS 2 (TAT 6-24 HRS): SARS Coronavirus 2: NEGATIVE

## 2019-03-22 LAB — URIC ACID: Uric Acid, Serum: 4.7 mg/dL (ref 2.5–7.1)

## 2019-03-22 MED ORDER — LABETALOL HCL 200 MG PO TABS
200.0000 mg | ORAL_TABLET | Freq: Three times a day (TID) | ORAL | Status: DC
  Administered 2019-03-23 (×2): 200 mg via ORAL
  Filled 2019-03-22 (×3): qty 1

## 2019-03-22 MED ORDER — DOCUSATE SODIUM 100 MG PO CAPS
100.0000 mg | ORAL_CAPSULE | Freq: Every day | ORAL | Status: DC
  Filled 2019-03-22: qty 1

## 2019-03-22 MED ORDER — SODIUM CHLORIDE 0.9 % IV SOLN: 250.0000 mL | INTRAVENOUS | Status: DC | PRN

## 2019-03-22 MED ORDER — ENOXAPARIN SODIUM 120 MG/0.8ML ~~LOC~~ SOLN
120.0000 mg | Freq: Two times a day (BID) | SUBCUTANEOUS | Status: DC
  Administered 2019-03-23 – 2019-03-24 (×3): 120 mg via SUBCUTANEOUS
  Filled 2019-03-22 (×6): qty 0.8

## 2019-03-22 MED ORDER — HYDRALAZINE HCL 50 MG PO TABS
50.0000 mg | ORAL_TABLET | Freq: Three times a day (TID) | ORAL | Status: DC
  Administered 2019-03-22 – 2019-03-24 (×6): 50 mg via ORAL
  Filled 2019-03-22 (×6): qty 1

## 2019-03-22 MED ORDER — ZOLPIDEM TARTRATE 5 MG PO TABS: 5.0000 mg | ORAL_TABLET | Freq: Every evening | ORAL | Status: DC | PRN

## 2019-03-22 MED ORDER — PANTOPRAZOLE SODIUM 40 MG PO TBEC
40.0000 mg | DELAYED_RELEASE_TABLET | Freq: Every day | ORAL | Status: DC
  Administered 2019-03-22 – 2019-03-24 (×3): 40 mg via ORAL
  Filled 2019-03-22 (×5): qty 1

## 2019-03-22 MED ORDER — SODIUM CHLORIDE 0.9% FLUSH: 3.0000 mL | INTRAVENOUS | Status: DC | PRN

## 2019-03-22 MED ORDER — METFORMIN HCL 500 MG PO TABS
500.0000 mg | ORAL_TABLET | Freq: Two times a day (BID) | ORAL | Status: DC
  Administered 2019-03-22 – 2019-03-24 (×4): 500 mg via ORAL
  Filled 2019-03-22 (×4): qty 1

## 2019-03-22 MED ORDER — NIFEDIPINE ER OSMOTIC RELEASE 30 MG PO TB24: 30.0000 mg | ORAL_TABLET | Freq: Every day | ORAL | Status: DC

## 2019-03-22 MED ORDER — ACETAMINOPHEN 325 MG PO TABS
650.0000 mg | ORAL_TABLET | ORAL | Status: DC | PRN
  Administered 2019-03-22 – 2019-03-23 (×2): 650 mg via ORAL
  Filled 2019-03-22 (×2): qty 2

## 2019-03-22 MED ORDER — LEVOTHYROXINE SODIUM 25 MCG PO TABS
25.0000 ug | ORAL_TABLET | Freq: Every day | ORAL | Status: DC
  Administered 2019-03-23 – 2019-03-24 (×2): 25 ug via ORAL
  Filled 2019-03-22 (×2): qty 1

## 2019-03-22 MED ORDER — HYDROMORPHONE HCL 2 MG PO TABS
2.0000 mg | ORAL_TABLET | Freq: Once | ORAL | Status: AC
  Administered 2019-03-22: 2 mg via ORAL
  Filled 2019-03-22: qty 1

## 2019-03-22 MED ORDER — PRENATAL MULTIVITAMIN CH
1.0000 | ORAL_TABLET | Freq: Every day | ORAL | Status: DC
  Administered 2019-03-24: 1 via ORAL
  Filled 2019-03-22: qty 1

## 2019-03-22 MED ORDER — CALCIUM CARBONATE ANTACID 500 MG PO CHEW
2.0000 | CHEWABLE_TABLET | ORAL | Status: DC | PRN
  Administered 2019-03-23: 400 mg via ORAL
  Filled 2019-03-22 (×2): qty 2

## 2019-03-22 MED ORDER — SODIUM CHLORIDE 0.9% FLUSH: 3.0000 mL | Freq: Two times a day (BID) | INTRAVENOUS | Status: DC

## 2019-03-22 MED ORDER — ASPIRIN 81 MG PO CHEW
81.0000 mg | CHEWABLE_TABLET | Freq: Every day | ORAL | Status: DC
  Administered 2019-03-23 – 2019-03-24 (×2): 81 mg via ORAL
  Filled 2019-03-22 (×2): qty 1

## 2019-03-22 NOTE — H&P (Signed)
Sabrina White is a 34 y.o. G1P0 at 63w5dby IVF presenting for HA and hypertension. Pt notes no contractions . Good fetal movement, No vaginal bleeding, not leaking fluid.  Pt with h/o chronic htn, on lisinopril and HCTZ prior to preg but switched to low dose labetalol with good control for almost 2 yrs prior to pregnancy while working with fertility treatements. In 26th week pt started with HA and home and home bps with dbp in 110's. Office visit 1/26 with bp 138/108, exam and labs neg for PEC and Procardia added. After first dose of procardia, worsening HA and elevated bps again today.   No scotomata, emesis/ nausea since last night. No RUQ pain. No edema. HA 8/10 in office. 2/10 upon presentation to hospital.   [facility] pregnancy, same sex couple - DVT, occurred during IVF and hormonal meds. Pt on Lovenox '120mg'$  bid due to continued active DVT, followed by vascular and DVT improving. 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 Last TSH nl 03/21/19 - chronic htn as above with new exacerbation. On baby ASA - Baseline pabs with ALT 50s, known fattly liver - PCOS, on metformin through preg, Stopped for 1hr GTT at 26wks, DS 104 but will cont to treat with metformin (early DS at 12 wks 132) - A neg, Rhogam planned at 28 wks  - fetal growth 26 wks. 1'13/ 26%, AC 9%, BPP 6/8 (-2 breathing), nl UA dopplers.    Prenatal Transfer Tool  Maternal Diabetes: No Genetic Screening: Normal Maternal Ultrasounds/Referrals: Normal Fetal Ultrasounds or other Referrals:  None Maternal Substance Abuse:  No Significant Maternal Medications:  Meds include: Syntroid Other:  Significant Maternal Lab Results: Other:      OB History    Gravida  1   Para      Term      Preterm      AB      Living        SAB      TAB      Ectopic      Multiple      Live Births             Past Medical History:  Diagnosis Date  .  Family history of breast cancer in mother 04/05/2017   Mother and maternal grandmother, not genetically tested. Paternal aunt, negative BRCA gene tested.  . Marland KitchenERD (gastroesophageal reflux disease)   . Hypertension   . PCOS (polycystic ovarian syndrome)    No past surgical history on file. Family History: family history includes Breast cancer in her maternal grandmother, mother, and paternal aunt; Hypertension in her father. Social History:  reports that she has never smoked. She has never used smokeless tobacco. She reports current alcohol use. She reports that she does not use drugs.  Review of Systems - Negative except headache     Blood pressure (!) 145/93, pulse 98, temperature 98.8 F (37.1 C), temperature source Oral, resp. rate 18, height '5\' 5"'$  (1.651 m), weight 115.7 kg, SpO2 99 %.  Physical Exam:  Gen: well appearing obese gravid female, no distress CV: RRR Pulm: CTAB Back: no CVAT Abd: gravid, NT, no RUQ pain LE: no edema, equal bilaterally, non-tender Toco: irritibilitt, no ctx FH: baseline 150s, accelerations present, no deceleratons, 10 beat variability, AGA  Prenatal labs: ABO, Rh:  A neg Antibody:  neg Rubella:  immmune RPR:   NR HBsAg:   neg HIV:  neg GBS:   not yet done 1 hr Glucola 132, 104  Genetic screening nl NT, nl AFP Anatomy US normal   Assessment/Plan: 34 y.o. G1P0 at 21w5dChronic hypertension with exacerbation, no lab/ exam findings c/w PEC but bps and HA concerning. Plan hospital observation with serial bps, possibly titration of bp meds, fetal monitoring. Repeat labs now. Will cont labetalol '200mg'$  q8hrs and procardia '30mg'$  XL nightly. BMZ and Magnesium started as risk for urgent delivery low at this time. However, should bps rise into concerning ranges will need to reassess. HA improving on admission to hospital.  - PCOS. Continue metfromin - Actvie DVT, treatment dose Lovenox, continue at this time. Will need to hold if considering delivery.     KAla Dach1/27/2021, 5:37 PM

## 2019-03-22 NOTE — Progress Notes (Signed)
Called by RN regarding h/a that has been present all day per her report. Pt reportedly started that the h/a started after she took procardia XL ( started last night)  And has continued. PIH labs reviewed. Nl labs. No urine PCR noted. Pt received tylenol 650mg  po @ 6pm w/o any change in sx. Hx chronic HTN . Given the recent tylenol use, will given dilaudid for h/a. PCR ordered. D/c procardia. Start on hydralazine for cont BP mgmt

## 2019-03-23 DIAGNOSIS — O10919 Unspecified pre-existing hypertension complicating pregnancy, unspecified trimester: Secondary | ICD-10-CM | POA: Diagnosis not present

## 2019-03-23 LAB — PROTEIN / CREATININE RATIO, URINE
Creatinine, Urine: 24.26 mg/dL
Total Protein, Urine: <6 mg/dL

## 2019-03-23 MED ORDER — LABETALOL HCL 200 MG PO TABS
400.0000 mg | ORAL_TABLET | Freq: Three times a day (TID) | ORAL | Status: DC
  Administered 2019-03-23 – 2019-03-24 (×3): 400 mg via ORAL
  Filled 2019-03-23 (×3): qty 2

## 2019-03-23 NOTE — Progress Notes (Signed)
Late entry, pt seen at 3pm  Pt notes HA resolved after sleep and Dilaudid. Good FM. No ctx, no LOF, no VB. Pt denies RUQ pain. No scotomata.No chest pain, no SOB.   O: Vitals:   03/23/19 1154 03/23/19 1218 03/23/19 1539 03/23/19 1920  BP: (!) 155/103 (!) 146/102 (!) 148/92 (!) 140/100  Pulse: 80 76 81 81  Resp: 18  18 18   Temp: 98.7 F (37.1 C)  98.4 F (36.9 C) 99 F (37.2 C)  TempSrc: Oral  Oral Oral  SpO2: 100% 100% 98%   Weight:      Height:       Gen: well appearing, no distress, lying in bed.  Abd: no RUQ pain, no fundal tenderness, gravid LE: NT, no edema  Toco: none FH: 130s, +10x10 accels, no decels, 10 beat variability.  CBC    Component Value Date/Time   WBC 11.5 (H) 03/22/2019 1758   RBC 4.23 03/22/2019 1758   HGB 12.1 03/22/2019 1758   HCT 35.2 (L) 03/22/2019 1758   PLT 237 03/22/2019 1758   MCV 83.2 03/22/2019 1758   MCH 28.6 03/22/2019 1758   MCHC 34.4 03/22/2019 1758   RDW 13.3 03/22/2019 1758   LYMPHSABS 3,218 04/05/2017 1140   EOSABS 756 (H) 04/05/2017 1140   BASOSABS 86 04/05/2017 1140    CMP     Component Value Date/Time   NA 135 03/22/2019 1758   K 4.0 03/22/2019 1758   CL 105 03/22/2019 1758   CO2 21 (L) 03/22/2019 1758   GLUCOSE 83 03/22/2019 1758   BUN 5 (L) 03/22/2019 1758   CREATININE 0.66 03/22/2019 1758   CALCIUM 9.1 03/22/2019 1758   PROT 6.1 (L) 03/22/2019 1758   ALBUMIN 2.6 (L) 03/22/2019 1758   AST 16 03/22/2019 1758   ALT 21 03/22/2019 1758   ALKPHOS 64 03/22/2019 1758   BILITOT 0.5 03/22/2019 1758   GFRNONAA >60 03/22/2019 1758   GFRAA >60 03/22/2019 1758   A/P: 34 yo G1 at 26'5 with exacerbation of bp and HA here for observation and titration of bp management. Pt with non-severe bps since admission and improvement in HA. Her bps still are higher than normal. No evidence PEC. Pt responded well to hydralizine. Will increase labetalol to 400 q 8rs and cont hydralizine. If bps continue to be in non-severe range and reactive  fetal testing will d/c home tomorrow - reactive fetal testing - active DVT, on lovenox  32 03/23/2019 8:51 PM

## 2019-03-24 DIAGNOSIS — O10919 Unspecified pre-existing hypertension complicating pregnancy, unspecified trimester: Secondary | ICD-10-CM | POA: Diagnosis not present

## 2019-03-24 MED ORDER — HYDRALAZINE HCL 50 MG PO TABS: 50.0000 mg | ORAL_TABLET | Freq: Three times a day (TID) | ORAL | 3 refills | Status: DC

## 2019-03-24 MED ORDER — LABETALOL HCL 200 MG PO TABS: 400.0000 mg | ORAL_TABLET | Freq: Three times a day (TID) | ORAL | 3 refills | Status: DC

## 2019-03-24 NOTE — Discharge Summary (Signed)
Patient ID: Sabrina White MRN: [id number] DOB/AGE: September 01, 1985 34 y.o.  Admit date: 03/22/2019 Discharge date: 03/24/19  Admission Diagnoses: Chronic hypertension affecting pregnancy [O10.919]  Discharge Diagnoses: Chronic hypertension affecting pregnancy [O10.919]         Discharged Condition: stable  Hospital Course: Admitted with htn and HA. Dilaudid improved HA. Switched from procardia to hydralizine with no side effects, needed to increase labetalol to 400mg  tid. bp remained stable, labs and exam negative for PEC. Fetal testing reactive.  On HD#3 pt notes no HA, no scotomata, no RUQ pain. Good FM, no LOF, no VB, no swelling, no ctx.  On exam: Vitals:   03/24/19 0828 03/24/19 1216 03/24/19 1228 03/24/19 1426  BP: (!) 143/93 (!) 158/96 128/77 119/79  Pulse: 85 78 78 84  Resp: 18 18    Temp: 98 F (36.7 C) 97.8 F (36.6 C)    TempSrc: Oral Oral    SpO2: 100% 98%    Weight:      Height:       Gen: well appearing, no distress Abd: soft, NT, gravid, no RUQ pain, no fundal tenderness GU: def LE: NT, no edema, 1+ DTR  Toco: none FH 140s, + acccels, no decels, 10 beat var  Consults: None and MFM  Treatments: antihypertensives, rest, analgesia  Disposition: home   Allergies as of 03/24/2019      Reactions   Codeine Other (See Comments)   Other reaction(s): Dizziness   Raspberry Other (See Comments)   Causes blisters on lips      Medication List    TAKE these medications   albuterol 108 (90 Base) MCG/ACT inhaler Commonly known as: VENTOLIN HFA Inhale 2 puffs into the lungs every 4 (four) hours as needed for wheezing or shortness of breath.   aspirin EC 81 MG tablet Take 81 mg by mouth daily.   Doxylamine-Pyridoxine 10-10 MG Tbec Take 1-4 tablets by mouth at bedtime as needed.   enoxaparin 120 MG/0.8ML injection Commonly known as: LOVENOX Inject into the skin 2 (two) times daily.   hydrALAZINE 50 MG tablet Commonly known as: APRESOLINE Take 1 tablet  (50 mg total) by mouth every 8 (eight) hours.   labetalol 200 MG tablet Commonly known as: NORMODYNE Take 2 tablets (400 mg total) by mouth every 8 (eight) hours. What changed:   how much to take  when to take this   levothyroxine 25 MCG tablet Commonly known as: SYNTHROID Take 25 mcg by mouth daily.   metFORMIN 500 MG tablet Commonly known as: GLUCOPHAGE Take 500 mg by mouth 2 (two) times daily with a meal.   omeprazole 20 MG capsule Commonly known as: PRILOSEC Take 1 capsule (20 mg total) by mouth 2 (two) times a day.   Pepcid AC Maximum Strength 20 MG Chew Generic drug: Famotidine Chew 1 tablet by mouth daily as needed (For heartburn.).   prenatal multivitamin Tabs tablet Take 1 tablet by mouth daily at 12 noon.        Signed: 03/26/2019, MD MD 03/24/2019, 3:38 PM

## 2019-03-24 NOTE — Progress Notes (Signed)
Pt discharged home with printed instructions. Pt verbalized an understanding. No concerns noted. Sabrina White L Adaya Garmany

## 2019-04-20 ENCOUNTER — Encounter (HOSPITAL_COMMUNITY): Payer: Self-pay | Admitting: Obstetrics

## 2019-04-20 ENCOUNTER — Other Ambulatory Visit: Payer: Self-pay

## 2019-04-20 ENCOUNTER — Inpatient Hospital Stay (HOSPITAL_COMMUNITY)
Admission: AD | Admit: 2019-04-20 | Discharge: 2019-04-23 | DRG: 832 | Disposition: A | Discharge status: Home / Self Care | Payer: BC Managed Care – PPO | Attending: Obstetrics | Admitting: Obstetrics

## 2019-04-20 DIAGNOSIS — E039 Hypothyroidism, unspecified: Secondary | ICD-10-CM | POA: Diagnosis present

## 2019-04-20 DIAGNOSIS — O365930 Maternal care for other known or suspected poor fetal growth, third trimester, not applicable or unspecified: Principal | ICD-10-CM | POA: Diagnosis present

## 2019-04-20 DIAGNOSIS — O09813 Supervision of pregnancy resulting from assisted reproductive technology, third trimester: Secondary | ICD-10-CM | POA: Diagnosis not present

## 2019-04-20 DIAGNOSIS — O10913 Unspecified pre-existing hypertension complicating pregnancy, third trimester: Secondary | ICD-10-CM | POA: Diagnosis not present

## 2019-04-20 DIAGNOSIS — E282 Polycystic ovarian syndrome: Secondary | ICD-10-CM | POA: Diagnosis present

## 2019-04-20 DIAGNOSIS — O99283 Endocrine, nutritional and metabolic diseases complicating pregnancy, third trimester: Secondary | ICD-10-CM | POA: Diagnosis present

## 2019-04-20 DIAGNOSIS — O10013 Pre-existing essential hypertension complicating pregnancy, third trimester: Secondary | ICD-10-CM | POA: Diagnosis present

## 2019-04-20 DIAGNOSIS — Z3A30 30 weeks gestation of pregnancy: Secondary | ICD-10-CM | POA: Diagnosis not present

## 2019-04-20 DIAGNOSIS — Z86718 Personal history of other venous thrombosis and embolism: Secondary | ICD-10-CM

## 2019-04-20 DIAGNOSIS — Z7901 Long term (current) use of anticoagulants: Secondary | ICD-10-CM | POA: Diagnosis not present

## 2019-04-20 DIAGNOSIS — O365931 Maternal care for other known or suspected poor fetal growth, third trimester, fetus 1: Secondary | ICD-10-CM

## 2019-04-20 DIAGNOSIS — O2233 Deep phlebothrombosis in pregnancy, third trimester: Secondary | ICD-10-CM | POA: Diagnosis not present

## 2019-04-20 DIAGNOSIS — Z20822 Contact with and (suspected) exposure to covid-19: Secondary | ICD-10-CM | POA: Diagnosis present

## 2019-04-20 DIAGNOSIS — Z3A31 31 weeks gestation of pregnancy: Secondary | ICD-10-CM | POA: Diagnosis not present

## 2019-04-20 DIAGNOSIS — O365990 Maternal care for other known or suspected poor fetal growth, unspecified trimester, not applicable or unspecified: Secondary | ICD-10-CM | POA: Diagnosis present

## 2019-04-20 HISTORY — DX: Hypothyroidism, unspecified: E03.9

## 2019-04-20 LAB — COMPREHENSIVE METABOLIC PANEL
ALT: 20 U/L (ref 0–44)
AST: 15 U/L (ref 15–41)
Albumin: 2.6 g/dL — ABNORMAL LOW (ref 3.5–5.0)
Alkaline Phosphatase: 71 U/L (ref 38–126)
Anion gap: 7 (ref 5–15)
BUN: 11 mg/dL (ref 6–20)
CO2: 22 mmol/L (ref 22–32)
Calcium: 9 mg/dL (ref 8.9–10.3)
Chloride: 107 mmol/L (ref 98–111)
Creatinine, Ser: 0.86 mg/dL (ref 0.44–1.00)
GFR calc Af Amer: >60 mL/min (ref >60)
GFR calc non Af Amer: >60 mL/min (ref >60)
Glucose, Bld: 102 mg/dL — ABNORMAL HIGH (ref 70–99)
Potassium: 3.7 mmol/L (ref 3.5–5.1)
Sodium: 136 mmol/L (ref 135–145)
Total Bilirubin: 0.2 mg/dL — ABNORMAL LOW (ref 0.3–1.2)
Total Protein: 5.8 g/dL — ABNORMAL LOW (ref 6.5–8.1)

## 2019-04-20 LAB — CBC
HCT: 35 % — ABNORMAL LOW (ref 36.0–46.0)
Hemoglobin: 11.6 g/dL — ABNORMAL LOW (ref 12.0–15.0)
MCH: 27.9 pg (ref 26.0–34.0)
MCHC: 33.1 g/dL (ref 30.0–36.0)
MCV: 84.1 fL (ref 80.0–100.0)
Platelets: 233 10*3/uL (ref 150–400)
RBC: 4.16 MIL/uL (ref 3.87–5.11)
RDW: 13.3 % (ref 11.5–15.5)
WBC: 14.9 10*3/uL — ABNORMAL HIGH (ref 4.0–10.5)
nRBC: 0 % (ref 0.0–0.2)

## 2019-04-20 LAB — TYPE AND SCREEN
ABO/RH(D): A NEG
Antibody Screen: POSITIVE

## 2019-04-20 MED ORDER — SODIUM CHLORIDE 0.9% FLUSH: 3.0000 mL | INTRAVENOUS | Status: DC | PRN

## 2019-04-20 MED ORDER — CALCIUM CARBONATE ANTACID 500 MG PO CHEW
2.0000 | CHEWABLE_TABLET | ORAL | Status: DC | PRN
  Administered 2019-04-20 – 2019-04-22 (×3): 400 mg via ORAL
  Filled 2019-04-20 (×3): qty 2

## 2019-04-20 MED ORDER — ACETAMINOPHEN 325 MG PO TABS
650.0000 mg | ORAL_TABLET | ORAL | Status: DC | PRN
  Administered 2019-04-21: 650 mg via ORAL
  Filled 2019-04-20: qty 2

## 2019-04-20 MED ORDER — DOCUSATE SODIUM 100 MG PO CAPS
100.0000 mg | ORAL_CAPSULE | Freq: Every day | ORAL | Status: DC
  Administered 2019-04-21 – 2019-04-23 (×3): 100 mg via ORAL
  Filled 2019-04-20 (×4): qty 1

## 2019-04-20 MED ORDER — HYDRALAZINE HCL 10 MG PO TABS
10.0000 mg | ORAL_TABLET | Freq: Four times a day (QID) | ORAL | Status: DC
  Administered 2019-04-20 – 2019-04-23 (×12): 10 mg via ORAL
  Filled 2019-04-20 (×12): qty 1

## 2019-04-20 MED ORDER — PRENATAL MULTIVITAMIN CH
1.0000 | ORAL_TABLET | Freq: Every day | ORAL | Status: DC
  Administered 2019-04-21 – 2019-04-23 (×3): 1 via ORAL
  Filled 2019-04-20 (×4): qty 1

## 2019-04-20 MED ORDER — ENOXAPARIN SODIUM 120 MG/0.8ML ~~LOC~~ SOLN
120.0000 mg | Freq: Two times a day (BID) | SUBCUTANEOUS | Status: DC
  Administered 2019-04-20 – 2019-04-23 (×6): 120 mg via SUBCUTANEOUS
  Filled 2019-04-20 (×9): qty 0.8

## 2019-04-20 MED ORDER — LABETALOL HCL 200 MG PO TABS
400.0000 mg | ORAL_TABLET | Freq: Three times a day (TID) | ORAL | Status: DC
  Administered 2019-04-20 – 2019-04-23 (×8): 400 mg via ORAL
  Filled 2019-04-20 (×8): qty 2

## 2019-04-20 MED ORDER — SODIUM CHLORIDE 0.9% FLUSH
3.0000 mL | Freq: Two times a day (BID) | INTRAVENOUS | Status: DC
  Administered 2019-04-20 – 2019-04-23 (×5): 3 mL via INTRAVENOUS

## 2019-04-20 MED ORDER — METFORMIN HCL 500 MG PO TABS
500.0000 mg | ORAL_TABLET | Freq: Two times a day (BID) | ORAL | Status: DC
  Administered 2019-04-20 – 2019-04-23 (×6): 500 mg via ORAL
  Filled 2019-04-20 (×6): qty 1

## 2019-04-20 MED ORDER — ZOLPIDEM TARTRATE 5 MG PO TABS: 5.0000 mg | ORAL_TABLET | Freq: Every evening | ORAL | Status: DC | PRN

## 2019-04-20 NOTE — H&P (Signed)
Sabrina White is a 34 y.o. G1P0 at 62w6dpresenting for admission for close fetal evaluation given IUGR with elevated and intermittent absent UA dopplers in the setting of chronic hypertension in pregnancy. Pt notes no contractions. Good fetal movement, No vaginal bleeding, not leaking fluid.  [facility], same sex couple, single FET, no PGS - DVT, occurred during IVF and hormonal meds. Pt on Lovenox '120mg'$  bid due to continued active DVT, followed by vascular and DVT improving. No symptoms of VTE. - hypothryoidism, stable through preg on 235m synthroid. Last TSH nl 04/20/19 - chronic htn. Well on labetalol 200 tid until 26 wks when meds increased to 400 tid. Additional antihypertensive needed, pt did not tolerated procardia (caused HA) and inpt admission for titration of bp meds. Pt changed to labetalol 400 tid and hydralizine '10mg'$  q6hrs. Pt w/o sx of PEC.  On baby ASA - Baseline labs with ALT 50s, known fattly liver - PCOS, on metformin through preg, Stopped for 1hr GTT at 26wks, DS 104 but will cont to treat with metformin (early DS at 12 wks 132) - A neg, s/p Rhogam at 28 wks  - fetal growth 26 wks. 1'13/ 26%, AC 9%, BPP 6/8 (-2 breathing), nl UA dopplers. 29 wks: 2'10/ 22% but AC at 3%. BPP 8/8, elevated UA dopplers. U/s 2/25: BPP 8/8, reactive NST, UA dopplers elevated with intermittent absent UA doppler. Post placenta, transverse position - BMZ 2/22 and 2/23   Prenatal Transfer Tool  Maternal Diabetes: No Genetic Screening: Normal Maternal Ultrasounds/Referrals: IUGR Fetal Ultrasounds or other Referrals:  None Maternal Substance Abuse:  No Significant Maternal Medications:  None Significant Maternal Lab Results: None     OB History    Gravida  1   Para      Term      Preterm      AB      Living        SAB      TAB      Ectopic      Multiple      Live Births             Past Medical History:  Diagnosis Date   . Family history of breast cancer in mother 04/05/2017   Mother and maternal grandmother, not genetically tested. Paternal aunt, negative BRCA gene tested.  . Marland KitchenERD (gastroesophageal reflux disease)   . Hypertension   . Hypothyroidism   . PCOS (polycystic ovarian syndrome)    History reviewed. No pertinent surgical history. Family History: family history includes Breast cancer in her maternal grandmother, mother, and paternal aunt; Hypertension in her father. Social History:  reports that she has never smoked. She has never used smokeless tobacco. She reports current alcohol use. She reports that she does not use drugs.  Review of Systems - Negative except discomfort of pregnancy     Blood pressure (!) 136/94, pulse 77, temperature 98.4 F (36.9 C), temperature source Oral, resp. rate 18, height '5\' 5"'$  (1.651 m), weight 117.5 kg, SpO2 100 %.  Physical Exam:Normal exam in office today   Toco: none FH: baseline 140s, accelerations present, no deceleratons, 10 beat variability  Prenatal labs: ABO, Rh: --/--/A NEG (02/25 2015) Antibody: POS (02/25 2015) Rubella:  immune RPR:   NR HBsAg:   neg HIV:   neg GBS:   not yet done 1 hr Glucola 104 (3rd trimester, while on metformin)  Genetic screening nl NT, nl AFP Anatomy  US normal   Assessment/Plan: 34 y.o. G1P0 at 94w6dIUGR with elevated UA dopplers with intermittent absent diastilic flow. D/w pt severity of early IUGR and will very likely need early delivery by PCS. We discussed that given prematurity we will follow closely with inpt management to gain greater gestation age. Normal progression from elevated SD ratios are to absent then reversed, at which time we would move to delivery. BMZ given 2/22 and 2/23. Should delivery be imminent will need Mag for neuroprophylaxis. Plan 2/wk UA dopplers and BPP and repeat growth [id number] wks from last. NST 1 hr q shift and prn - chronic htn. No evidence of PEC, repeat labs done on admit. Pt aware  of si/sx,. Will cont hydralizine '10mg'$  q6hrs and labetalol '400mg'$  q 8 hrs - PCOS. Cont metformin, carb reduced diet - Active DVT. Has been on Lovenox '120mg'$  bid, will cont that dose for now though may switch to therapeutic dose heparin. Repeat LE dopplers were planned for next wk with vascular team, will consult them about repeating them while inpt.    KAla Dach2/25/2021, 9:57 PM

## 2019-04-20 NOTE — MAU Note (Signed)
Covid swab obtained without difficulty and pt tol well. No symptoms 

## 2019-04-21 ENCOUNTER — Inpatient Hospital Stay (HOSPITAL_BASED_OUTPATIENT_CLINIC_OR_DEPARTMENT_OTHER): Payer: BC Managed Care – PPO

## 2019-04-21 ENCOUNTER — Encounter (HOSPITAL_COMMUNITY): Payer: BC Managed Care – PPO

## 2019-04-21 DIAGNOSIS — O09813 Supervision of pregnancy resulting from assisted reproductive technology, third trimester: Secondary | ICD-10-CM

## 2019-04-21 DIAGNOSIS — O365930 Maternal care for other known or suspected poor fetal growth, third trimester, not applicable or unspecified: Secondary | ICD-10-CM | POA: Diagnosis not present

## 2019-04-21 DIAGNOSIS — O99283 Endocrine, nutritional and metabolic diseases complicating pregnancy, third trimester: Secondary | ICD-10-CM

## 2019-04-21 DIAGNOSIS — Z3A31 31 weeks gestation of pregnancy: Secondary | ICD-10-CM

## 2019-04-21 DIAGNOSIS — O2233 Deep phlebothrombosis in pregnancy, third trimester: Secondary | ICD-10-CM | POA: Diagnosis not present

## 2019-04-21 DIAGNOSIS — E039 Hypothyroidism, unspecified: Secondary | ICD-10-CM

## 2019-04-21 DIAGNOSIS — O10913 Unspecified pre-existing hypertension complicating pregnancy, third trimester: Secondary | ICD-10-CM

## 2019-04-21 DIAGNOSIS — O365931 Maternal care for other known or suspected poor fetal growth, third trimester, fetus 1: Secondary | ICD-10-CM

## 2019-04-21 LAB — SARS CORONAVIRUS 2 (TAT 6-24 HRS): SARS Coronavirus 2: NEGATIVE

## 2019-04-21 MED ORDER — LEVOTHYROXINE SODIUM 25 MCG PO TABS
25.0000 ug | ORAL_TABLET | Freq: Every day | ORAL | Status: DC
  Administered 2019-04-22 – 2019-04-23 (×2): 25 ug via ORAL
  Filled 2019-04-21 (×2): qty 1

## 2019-04-21 MED ORDER — PANTOPRAZOLE SODIUM 40 MG PO TBEC
40.0000 mg | DELAYED_RELEASE_TABLET | Freq: Every day | ORAL | Status: DC
  Administered 2019-04-21 – 2019-04-23 (×3): 40 mg via ORAL
  Filled 2019-04-21 (×3): qty 1

## 2019-04-21 NOTE — Progress Notes (Signed)
HD #2 IUGR with elevated and intermittent absent UA dopplers 31'0, BMZ complete Chronic htn.  S: Pt notes slight HA yesterday upon admission, none now. No scotomata. Active FM, no LOF, no VB, no contractions. No LE pain.   O:  Vitals:   04/21/19 0653 04/21/19 0806 04/21/19 1113 04/21/19 1456  BP: 134/79 (!) 143/89 (!) 147/86 (!) 148/85  Pulse: 75 79 75 83  Resp: 18 18 18 18   Temp: 97.8 F (36.6 C) 98.3 F (36.8 C) 98.2 F (36.8 C) 98 F (36.7 C)  TempSrc: Oral Oral Oral Oral  SpO2: 98% 99% 99% 100%  Weight:      Height:       Gen: well appearing, lying on side Abd: gravid, NT, no RUQ pain LE: NT, no edema  Toco: 140s, + accels, no decels, 10 beat var Toco: none  CBC Latest Ref Rng & Units 04/20/2019 03/22/2019 04/05/2017  WBC 4.0 - 10.5 K/uL 14.9(H) 11.5(H) 10.8  Hemoglobin 12.0 - 15.0 g/dL 11.6(L) 12.1 14.2  Hematocrit 36.0 - 46.0 % 35.0(L) 35.2(L) 41.4  Platelets 150 - 400 K/uL 233 237 283   CMP Latest Ref Rng & Units 04/20/2019 03/22/2019 04/05/2017  Glucose 70 - 99 mg/dL 102(H) 83 79  BUN 6 - 20 mg/dL 11 5(L) 11  Creatinine 0.44 - 1.00 mg/dL 0.86 0.66 0.98  Sodium 135 - 145 mmol/L 136 135 138  Potassium 3.5 - 5.1 mmol/L 3.7 4.0 4.0  Chloride 98 - 111 mmol/L 107 105 100  CO2 22 - 32 mmol/L 22 21(L) 29  Calcium 8.9 - 10.3 mg/dL 9.0 9.1 9.0  Total Protein 6.5 - 8.1 g/dL 5.8(L) 6.1(L) 7.0  Total Bilirubin 0.3 - 1.2 mg/dL 0.2(L) 0.5 0.5  Alkaline Phos 38 - 126 U/L 71 64 64  AST 15 - 41 U/L 15 16 21   ALT 0 - 44 U/L 20 21 34   24 hr urine from office, reported 2/26: 14g protein/ 24 hr  UA doppler (verbal report from Dr. Annamaria Boots): UA dopplers elevated but not reversed.  A/P: 34 yo G1 at 61'0 with IUGR with elevated and intermittent absent diastolic flow in the setting of chronic hypertension. - IUGR. UA dopplers only elevated today w/o absence. Appreciate consult from Dr. Annamaria Boots. WIll repeat UA dopplers on Mon. If stable, can consider outpt management with 2/wk UA doppler and  BPP. Will cont qshift NST. Will need Mag for neuroprotection if delivery planned <32. Wks. Would plan delivery at 33-34 wks with absent EDF in setting of IUGR.  - Chronic htn. Well controlled on hydralizine 10 q6hrs and labetalol 400 tid. No evidence PEC and admission CMP/ CBC normal. 24 protein from office with very high, 14g, protein, lab being re-run to r/o lab error. Will repeat 24 collection whil in house.  - hypothyroidism, on synthroid - PCOS, carb modified diet, cont metformin - Active DVT. No SCDs, on theraputic anticoagulation with Lovenox 120mg  bid. With worsening dopplers and impending delivery will need to switch to heparin drip. Curbside consult with hematology recommends pharmacy consult to manage heparin drip and follow aPTT levels when delivery pending. Pt aware that we could transition now to be more prepared for an urgent, unplanned delivery but that would entail continuous drip with frequent labs. Pt understands that should emergent delivery be needed based on worrisome NST, and she is not on heparin drip, she would need general anasthesia, partner would not be allowed in room, higher surgical risks of bleeding. Balancing theses R/B will cont on  Lovenox for now. Repeat LE dopplers next wk.   Lendon Colonel 04/21/2019 4:30 PM   -

## 2019-04-21 NOTE — Progress Notes (Signed)
NST assessed, reactive

## 2019-04-21 NOTE — Consult Note (Signed)
Neonatology Consult to Antenatal Patient:  I was asked by Dr. Algie Coffer to see this patient in order to provide antenatal counseling due to preamturity.  Sabrina White was admitted 04/20/19 at 30.[redacted] weeks GA for fetal monitoring . She is currently not having active labor and is intact but baby is IUGR with recent elevated and intermittent absent UA dopplers in the setting of chronic hypertension.  She is BMZ complete 2/23 and receiving labetalol, hydralazine, metformin and synthroid.  Good PNC also complicated by IVF pregnancy, DVT during pregnancy, PCOS.  Spouse supportive.    I spoke with the patient. We discussed the worst case of delivery in the next 1-2 days, including usual DR management, possible respiratory complications and need for support, IV access, feedings (mother desires breast feeding, which was encouraged, and donor milk discussed), LOS, Mortality and Morbidity, and long term outcomes.  She expressed good understanding of the potential outcomes and general NICU management expectations. We  would be glad to come back if she or her spouse has more questions later.  Thank you for asking me to see this patient.  Dineen Kid Leary Roca, MD Neonatologist  The total length of face-to-face or floor/unit time for this encounter was 25 minutes. Counseling and/or coordination of care was 40 minutes of the above.

## 2019-04-21 NOTE — Consult Note (Signed)
MFM Note  Sabrina White was seen in consultation due to fetal growth restriction with abnormal umbilical artery Doppler studies.  The patient has been followed with serial ultrasounds by the [facility] group due to a history of chronic hypertension.  Her most recent growth ultrasound performed at around 29 weeks showed an EFW of 2 pounds 10 ounces (22nd percentile for her gestational age) but the abdominal circumference measured only at the 3rd percentile, indicating fetal growth restriction. Intermittent absent end-diastolic flow was noted on her umbilical artery Doppler studies yesterday.  The patient was hospitalized at the end of January due to elevated blood pressures.  Her antihypertensive medications were changed at that time to labetalol 400 mg 3 times a day and hydralazine 10 mg every 6 hours.  Her [place] labs on admission were all within normal limits.  This is an IVF pregnancy.  The patient reports that she developed a DVT while undergoing hormonal treatments for the IVF cycle.  Due to the DVT, the patient has been treated with a therapeutic dose of Lovenox 120 mg twice a day.  The patient also has a history of hypothyroidism that is currently treated with Synthroid.  Her last TSH was within normal limits.    According to Dr. Valentino Saxon, the patient's 24-hour urine results from yesterday showed 14 g of protein.  It is uncertain if this is a correct measurement or if it is a lab error.  The patient received a complete course of antenatal corticosteroids earlier this week.  An ultrasound performed this afternoon showed normal amniotic fluid levels. Doppler studies of the umbilical arteries performed today showed an elevated S/D ratio.  There were no signs of absent or reversed end-diastolic flow.  These findings may be consistent with what was noted previously with intermittent absent end-diastolic flow.  The patient was advised that the usual management of fetal growth restriction is increased  fetal surveillance with weekly fetal testing and umbilical artery Doppler studies.  She was advised that should the overall percentage of the estimated fetal weight to be low (<5%) or should there be absent or reversed diastolic flow noted on the umbilical artery Doppler studies, that there may be an increased risk of an adverse pregnancy outcomes such as stillbirth.  Intense fetal monitoring and delivery are the only ways to avoid a stillbirth should these abnormalities be noted.  As it is currently uncertain whether the patient has developed preeclampsia and due to fetal growth restriction with abnormal umbilical artery Doppler studies, the patient will remain in the hospital with an NST performed every shift.  Consideration should be given to repeating a 24-hour urine.  We will plan on repeating umbilical artery Doppler studies again early next week.  Should the patient be diagnosed with preeclampsia based on her 24-hour urine results in the setting of fetal growth restriction , continued inpatient management until delivery would be recommended.    Should preeclampsia be ruled out and her umbilical artery Doppler studies show either an elevated S/D ratio or absent end-diastolic flow, outpatient management with twice weekly fetal testing and umbilical artery Doppler studies may be considered.  The issue of her therapeutic anticoagulation was also discussed with the patient today.  She understands that there is no antidote or reversal medication for Lovenox.  Should the patient require an urgent cesarean delivery for nonreassuring fetal status while she is receiving therapeutic Lovenox, she would be at significant risk for hemorrhage.  Should the patient remain in the hospital for a prolonged  period of time or should her umbilical artery Doppler studies show persistent absent end-diastolic flow while she is an inpatient, consideration should be given to switching her over to a therapeutic heparin drip, so  that the effects may be reversed with protamine sulfate if necessary.  Should persistent absent end-diastolic flow be noted on her umbilical artery Doppler studies, delivery would be recommended at around 33 to 34 weeks.  Should reversed end-diastolic flow be noted delivery will be recommended by 32 weeks.  The plan for now is to keep the patient in the hospital at least through the weekend for fetal monitoring and to sort out if she has preeclampsia.  As only an elevated S/D ratio with continued forward flow was noted on her umbilical artery Doppler studies today and as the fetal testing has been reassuring, the risk for an urgent delivery is low at this time and therefore she will be continued on therapeutic Lovenox for now.  I will continue to follow her closely with you and will make further recommendations based on the results of her labs and her future ultrasound exams.  At the end of the consultation today, the patient stated that all her questions had been answered to her complete satisfaction.    Thank you for referring this patient for a Maternal-Fetal Medicine consultation.

## 2019-04-22 LAB — PROTEIN, URINE, 24 HOUR
Collection Interval-UPROT: 24 hours
Protein, Urine: <6 mg/dL
Urine Total Volume-UPROT: 3800 mL

## 2019-04-22 LAB — CREATININE, URINE, 24 HOUR
Collection Interval-UCRE24: 24 hours
Creatinine, 24H Ur: 1800 mg/d (ref 600–1800)
Creatinine, Urine: 47.37 mg/dL
Urine Total Volume-UCRE24: 3800 mL

## 2019-04-22 NOTE — Progress Notes (Signed)
HD #3 IUGR with elevated UAD 31w 1d, BMZ complete Chronic htn.  S: Pt notes slight HA  upon admission, none now. No scotomata. Active FM, no LOF, no VB, no contractions. No LE pain. No CP, SOB , visual changes or epigastric pain.  O:  Vitals:   04/22/19 0000 04/22/19 0421 04/22/19 0608 04/22/19 0802  BP:  (!) 143/86 (!) 155/93 (!) 139/93  Pulse:  73 72 73  Resp:  18 19 18   Temp:  98.2 F (36.8 C) 98.1 F (36.7 C) 98.1 F (36.7 C)  TempSrc:  Oral Oral Oral  SpO2: 99% 100% 98% 99%  Weight:      Height:       Gen: well appearing, lying on side NCAT Neck: supple Lungs: CTA CV: RRR Abd: gravid, NT, no RUQ pain LE: NT, no edema, DTRs 2+ Neuro: nonfocal Skin: Intact  Toco: 140s, + accels, no decels, 10 beat var Toco: none Reactive NST x 3  CBC Latest Ref Rng & Units 04/20/2019 03/22/2019 04/05/2017  WBC 4.0 - 10.5 K/uL 14.9(H) 11.5(H) 10.8  Hemoglobin 12.0 - 15.0 g/dL 11.6(L) 12.1 14.2  Hematocrit 36.0 - 46.0 % 35.0(L) 35.2(L) 41.4  Platelets 150 - 400 K/uL 233 237 283   CMP Latest Ref Rng & Units 04/20/2019 03/22/2019 04/05/2017  Glucose 70 - 99 mg/dL 06/03/2017) 83 79  BUN 6 - 20 mg/dL 11 5(L) 11  Creatinine 0.44 - 1.00 mg/dL 518(A 4.16 6.06  Sodium 135 - 145 mmol/L 136 135 138  Potassium 3.5 - 5.1 mmol/L 3.7 4.0 4.0  Chloride 98 - 111 mmol/L 107 105 100  CO2 22 - 32 mmol/L 22 21(L) 29  Calcium 8.9 - 10.3 mg/dL 9.0 9.1 9.0  Total Protein 6.5 - 8.1 g/dL 3.01) 6.1(L) 7.0  Total Bilirubin 0.3 - 1.2 mg/dL 6.0(F) 0.5 0.5  Alkaline Phos 38 - 126 U/L 71 64 64  AST 15 - 41 U/L 15 16 21   ALT 0 - 44 U/L 20 21 34   24 hr urine from office, reported 2/26: 14g protein/ 24 hr Corrected 260mg . Normal  UA doppler (per MFM): UA dopplers elevated but not reversed.  A/P:  - IUGR. UA dopplers only elevated S/D, no absent flow. Nl AFI. Reactive NST. - Chronic htn. Well controlled on hydralizine 10 q6hrs and labetalol 400 tid. Nl labs. Noted one severe range BP yesterday Corrected 24 UTP  nl - Hypothyroidism, on synthroid - Active DVT. No SCDs, on theraputic anticoagulation with Lovenox 120mg  bid.    Finish 24 UTP here today Rpt labs tomorrow Possible DC tomorrow pending results. Alfrieda Tarry J 04/22/2019 11:23 AM   -

## 2019-04-23 LAB — CBC WITH DIFFERENTIAL/PLATELET
Abs Immature Granulocytes: 0.06 10*3/uL (ref 0.00–0.07)
Basophils Absolute: 0 10*3/uL (ref 0.0–0.1)
Basophils Relative: 0 %
Eosinophils Absolute: 0.1 10*3/uL (ref 0.0–0.5)
Eosinophils Relative: 1 %
HCT: 35.8 % — ABNORMAL LOW (ref 36.0–46.0)
Hemoglobin: 11.6 g/dL — ABNORMAL LOW (ref 12.0–15.0)
Immature Granulocytes: 1 %
Lymphocytes Relative: 32 %
Lymphs Abs: 3.7 10*3/uL (ref 0.7–4.0)
MCH: 27.5 pg (ref 26.0–34.0)
MCHC: 32.4 g/dL (ref 30.0–36.0)
MCV: 84.8 fL (ref 80.0–100.0)
Monocytes Absolute: 0.6 10*3/uL (ref 0.1–1.0)
Monocytes Relative: 5 %
Neutro Abs: 7.1 10*3/uL (ref 1.7–7.7)
Neutrophils Relative %: 61 %
Platelets: 208 10*3/uL (ref 150–400)
RBC: 4.22 MIL/uL (ref 3.87–5.11)
RDW: 13.3 % (ref 11.5–15.5)
WBC: 11.7 10*3/uL — ABNORMAL HIGH (ref 4.0–10.5)
nRBC: 0 % (ref 0.0–0.2)

## 2019-04-23 LAB — COMPREHENSIVE METABOLIC PANEL
ALT: 18 U/L (ref 0–44)
AST: 12 U/L — ABNORMAL LOW (ref 15–41)
Albumin: 2.5 g/dL — ABNORMAL LOW (ref 3.5–5.0)
Alkaline Phosphatase: 68 U/L (ref 38–126)
Anion gap: 8 (ref 5–15)
BUN: 8 mg/dL (ref 6–20)
CO2: 23 mmol/L (ref 22–32)
Calcium: 8.8 mg/dL — ABNORMAL LOW (ref 8.9–10.3)
Chloride: 105 mmol/L (ref 98–111)
Creatinine, Ser: 0.74 mg/dL (ref 0.44–1.00)
GFR calc Af Amer: >60 mL/min (ref >60)
GFR calc non Af Amer: >60 mL/min (ref >60)
Glucose, Bld: 84 mg/dL (ref 70–99)
Potassium: 3.7 mmol/L (ref 3.5–5.1)
Sodium: 136 mmol/L (ref 135–145)
Total Bilirubin: <0.1 mg/dL — ABNORMAL LOW (ref 0.3–1.2)
Total Protein: 5.7 g/dL — ABNORMAL LOW (ref 6.5–8.1)

## 2019-04-23 NOTE — Discharge Summary (Signed)
Obstetric Discharge Summary Reason for Admission: IUGR with abnormal dopplers Prenatal Procedures: NST and ultrasound Intrapartum Procedures: na Postpartum Procedures: an Complications-Operative and Postpartum: none Hemoglobin  Date Value Ref Range Status  04/23/2019 11.6 (L) 12.0 - 15.0 g/dL Final   HCT  Date Value Ref Range Status  04/23/2019 35.8 (L) 36.0 - 46.0 % Final    Physical Exam:  General: alert, cooperative and appears stated age 34: appropriate Uterine Fundus: firm Incision: na DVT Evaluation: No evidence of DVT seen on physical exam.  Discharge Diagnoses: IUGR  Discharge Information: Date: 04/23/2019 Activity: pelvic rest Diet: routine and stable home meds Medications: PNV and Iron Condition: stable Instructions: refer to practice specific booklet Discharge to: home   Newborn Data: This patient has no babies on file. Home with na.  Sabrina White J 04/23/2019, 5:31 PM

## 2019-04-23 NOTE — Progress Notes (Signed)
Discharge instructions given to patient and significant other. Instructed to call MFM office [phone] tomorrow to make appointments for twice weekly MFM ultrasound testing for BPP and UAD. Patient and significant other verbalized understanding of all instructions provided.

## 2019-04-23 NOTE — Progress Notes (Signed)
HD #4 IUGR with elevated UAD 31w 2d, BMZ complete Chronic htn.  S: Pt notes slight HA  upon admission, none now. No scotomata. Active FM, no LOF, no VB, no contractions. No LE pain. No CP, SOB , visual changes or epigastric pain.  O:  Vitals:   04/22/19 2211 04/23/19 0001 04/23/19 0608 04/23/19 0804  BP: 136/72 (!) 148/78 (!) 155/83 (!) 143/94  Pulse: 79 73 74 75  Resp: 20 20 (!) 22 20  Temp: 98.5 F (36.9 C) 98.5 F (36.9 C) 98 F (36.7 C) 98.1 F (36.7 C)  TempSrc: Oral Oral Oral Oral  SpO2: 99% 99% 99% 98%  Weight:      Height:       Gen: well appearing, lying on side NCAT Neck: supple Lungs: CTA CV: RRR Abd: gravid, NT, no RUQ pain LE: NT, no edema, DTRs 2+ Neuro: nonfocal Skin: Intact  Toco: 140s, + accels, no decels, 10 beat var Toco: none Reactive NST x 3  CBC Latest Ref Rng & Units 04/23/2019 04/20/2019 03/22/2019  WBC 4.0 - 10.5 K/uL 11.7(H) 14.9(H) 11.5(H)  Hemoglobin 12.0 - 15.0 g/dL 11.6(L) 11.6(L) 12.1  Hematocrit 36.0 - 46.0 % 35.8(L) 35.0(L) 35.2(L)  Platelets 150 - 400 K/uL 208 233 237   CMP Latest Ref Rng & Units 04/23/2019 04/20/2019 03/22/2019  Glucose 70 - 99 mg/dL 84 829(F) 83  BUN 6 - 20 mg/dL 8 11 5(L)  Creatinine 6.21 - 1.00 mg/dL 3.08 6.57 8.46  Sodium 135 - 145 mmol/L 136 136 135  Potassium 3.5 - 5.1 mmol/L 3.7 3.7 4.0  Chloride 98 - 111 mmol/L 105 107 105  CO2 22 - 32 mmol/L 23 22 21(L)  Calcium 8.9 - 10.3 mg/dL 9.6(E) 9.0 9.1  Total Protein 6.5 - 8.1 g/dL 9.5(M) 8.4(X) 6.1(L)  Total Bilirubin 0.3 - 1.2 mg/dL <3.2(G) 4.0(N) 0.5  Alkaline Phos 38 - 126 U/L 68 71 64  AST 15 - 41 U/L 12(L) 15 16  ALT 0 - 44 U/L 18 20 21   24  hr urine from office, reported 2/26: 14g protein/ 24 hr Corrected 260mg . Normal Rpt 24 UTP < 6mg  /24 hr.  UA doppler (per MFM): UA dopplers elevated but not reversed.  A/P:  - IUGR. UA dopplers only elevated S/D, no absent flow. Nl AFI. Reactive NST x3 - Chronic htn. Well controlled on hydralizine 10 q6hrs and  labetalol 400 tid. Nl labs today. .  Corrected 24 UTP nl and rpt 24 UTP nl today. - Hypothyroidism, on synthroid - Active DVT. No SCDs, on theraputic anticoagulation with Lovenox 120mg  bid.    DC home today PEC precautions. FU MFM UAD/BPP twice weekly Weekly ROB with WOB Brenon Antosh J 04/23/2019 11:59 AM   -

## 2019-04-24 ENCOUNTER — Other Ambulatory Visit (HOSPITAL_COMMUNITY): Payer: Self-pay | Admitting: *Deleted

## 2019-04-24 DIAGNOSIS — O365930 Maternal care for other known or suspected poor fetal growth, third trimester, not applicable or unspecified: Secondary | ICD-10-CM

## 2019-04-25 ENCOUNTER — Other Ambulatory Visit: Payer: Self-pay

## 2019-04-25 ENCOUNTER — Ambulatory Visit (HOSPITAL_COMMUNITY)
Admission: RE | Admit: 2019-04-25 | Discharge: 2019-04-25 | Disposition: A | Discharge status: Home / Self Care | Payer: BC Managed Care – PPO | Source: Ambulatory Visit | Attending: Obstetrics | Admitting: Obstetrics

## 2019-04-25 ENCOUNTER — Encounter (HOSPITAL_COMMUNITY): Payer: Self-pay | Admitting: *Deleted

## 2019-04-25 ENCOUNTER — Ambulatory Visit (HOSPITAL_COMMUNITY): Payer: BC Managed Care – PPO | Admitting: *Deleted

## 2019-04-25 ENCOUNTER — Other Ambulatory Visit (HOSPITAL_COMMUNITY): Payer: Self-pay | Admitting: *Deleted

## 2019-04-25 DIAGNOSIS — O365990 Maternal care for other known or suspected poor fetal growth, unspecified trimester, not applicable or unspecified: Secondary | ICD-10-CM

## 2019-04-25 DIAGNOSIS — O10919 Unspecified pre-existing hypertension complicating pregnancy, unspecified trimester: Secondary | ICD-10-CM | POA: Diagnosis present

## 2019-04-25 DIAGNOSIS — O365930 Maternal care for other known or suspected poor fetal growth, third trimester, not applicable or unspecified: Secondary | ICD-10-CM | POA: Diagnosis not present

## 2019-04-25 DIAGNOSIS — Z3A31 31 weeks gestation of pregnancy: Secondary | ICD-10-CM | POA: Diagnosis not present

## 2019-04-28 ENCOUNTER — Other Ambulatory Visit: Payer: Self-pay

## 2019-04-28 ENCOUNTER — Ambulatory Visit (HOSPITAL_COMMUNITY)
Admission: RE | Admit: 2019-04-28 | Discharge: 2019-04-28 | Disposition: A | Discharge status: Home / Self Care | Payer: BC Managed Care – PPO | Source: Ambulatory Visit | Attending: Obstetrics | Admitting: Obstetrics

## 2019-04-28 ENCOUNTER — Encounter (HOSPITAL_COMMUNITY): Payer: Self-pay

## 2019-04-28 ENCOUNTER — Other Ambulatory Visit (HOSPITAL_COMMUNITY): Payer: Self-pay | Admitting: Obstetrics

## 2019-04-28 ENCOUNTER — Ambulatory Visit (HOSPITAL_COMMUNITY): Payer: BC Managed Care – PPO | Admitting: *Deleted

## 2019-04-28 DIAGNOSIS — O365930 Maternal care for other known or suspected poor fetal growth, third trimester, not applicable or unspecified: Secondary | ICD-10-CM | POA: Insufficient documentation

## 2019-04-28 DIAGNOSIS — O10919 Unspecified pre-existing hypertension complicating pregnancy, unspecified trimester: Secondary | ICD-10-CM

## 2019-04-28 DIAGNOSIS — Z3A32 32 weeks gestation of pregnancy: Secondary | ICD-10-CM

## 2019-05-02 ENCOUNTER — Ambulatory Visit (HOSPITAL_COMMUNITY): Payer: BC Managed Care – PPO | Admitting: *Deleted

## 2019-05-02 ENCOUNTER — Ambulatory Visit (HOSPITAL_COMMUNITY)
Admission: RE | Admit: 2019-05-02 | Discharge: 2019-05-02 | Disposition: A | Discharge status: Home / Self Care | Payer: BC Managed Care – PPO | Source: Ambulatory Visit | Attending: Obstetrics | Admitting: Obstetrics

## 2019-05-02 ENCOUNTER — Other Ambulatory Visit: Payer: Self-pay

## 2019-05-02 ENCOUNTER — Encounter (HOSPITAL_COMMUNITY): Payer: Self-pay | Admitting: *Deleted

## 2019-05-02 DIAGNOSIS — O10919 Unspecified pre-existing hypertension complicating pregnancy, unspecified trimester: Secondary | ICD-10-CM

## 2019-05-02 DIAGNOSIS — O365990 Maternal care for other known or suspected poor fetal growth, unspecified trimester, not applicable or unspecified: Secondary | ICD-10-CM | POA: Insufficient documentation

## 2019-05-02 DIAGNOSIS — O365930 Maternal care for other known or suspected poor fetal growth, third trimester, not applicable or unspecified: Secondary | ICD-10-CM

## 2019-05-02 DIAGNOSIS — O2233 Deep phlebothrombosis in pregnancy, third trimester: Secondary | ICD-10-CM

## 2019-05-02 DIAGNOSIS — Z3A32 32 weeks gestation of pregnancy: Secondary | ICD-10-CM | POA: Diagnosis not present

## 2019-05-02 NOTE — Progress Notes (Signed)
Patient states she takes her BP meds @ 3:30PM. Will take as soon as she gets home. Dr. Parke Poisson reviewed.

## 2019-05-05 ENCOUNTER — Ambulatory Visit (HOSPITAL_COMMUNITY): Payer: BC Managed Care – PPO | Admitting: *Deleted

## 2019-05-05 ENCOUNTER — Inpatient Hospital Stay (HOSPITAL_COMMUNITY)
Admission: AD | Admit: 2019-05-05 | Discharge: 2019-05-10 | DRG: 787 | Disposition: A | Discharge status: Home / Self Care | Payer: BC Managed Care – PPO | Attending: Obstetrics & Gynecology | Admitting: Obstetrics & Gynecology

## 2019-05-05 ENCOUNTER — Encounter (HOSPITAL_COMMUNITY): Payer: Self-pay

## 2019-05-05 ENCOUNTER — Ambulatory Visit (HOSPITAL_BASED_OUTPATIENT_CLINIC_OR_DEPARTMENT_OTHER)
Admission: RE | Admit: 2019-05-05 | Discharge: 2019-05-05 | Disposition: A | Discharge status: Home / Self Care | Payer: BC Managed Care – PPO | Source: Ambulatory Visit | Attending: Obstetrics | Admitting: Obstetrics

## 2019-05-05 ENCOUNTER — Other Ambulatory Visit: Payer: Self-pay

## 2019-05-05 ENCOUNTER — Other Ambulatory Visit: Payer: Self-pay | Admitting: Obstetrics

## 2019-05-05 DIAGNOSIS — O321XX0 Maternal care for breech presentation, not applicable or unspecified: Secondary | ICD-10-CM

## 2019-05-05 DIAGNOSIS — Z3A33 33 weeks gestation of pregnancy: Secondary | ICD-10-CM | POA: Diagnosis not present

## 2019-05-05 DIAGNOSIS — O2233 Deep phlebothrombosis in pregnancy, third trimester: Secondary | ICD-10-CM | POA: Diagnosis not present

## 2019-05-05 DIAGNOSIS — Z6791 Unspecified blood type, Rh negative: Secondary | ICD-10-CM

## 2019-05-05 DIAGNOSIS — O99213 Obesity complicating pregnancy, third trimester: Secondary | ICD-10-CM

## 2019-05-05 DIAGNOSIS — O10919 Unspecified pre-existing hypertension complicating pregnancy, unspecified trimester: Secondary | ICD-10-CM | POA: Diagnosis present

## 2019-05-05 DIAGNOSIS — O365930 Maternal care for other known or suspected poor fetal growth, third trimester, not applicable or unspecified: Secondary | ICD-10-CM | POA: Diagnosis present

## 2019-05-05 DIAGNOSIS — Z7984 Long term (current) use of oral hypoglycemic drugs: Secondary | ICD-10-CM | POA: Diagnosis not present

## 2019-05-05 DIAGNOSIS — D62 Acute posthemorrhagic anemia: Secondary | ICD-10-CM | POA: Diagnosis not present

## 2019-05-05 DIAGNOSIS — O1002 Pre-existing essential hypertension complicating childbirth: Secondary | ICD-10-CM | POA: Diagnosis present

## 2019-05-05 DIAGNOSIS — O26893 Other specified pregnancy related conditions, third trimester: Secondary | ICD-10-CM | POA: Diagnosis present

## 2019-05-05 DIAGNOSIS — O09813 Supervision of pregnancy resulting from assisted reproductive technology, third trimester: Secondary | ICD-10-CM

## 2019-05-05 DIAGNOSIS — E669 Obesity, unspecified: Secondary | ICD-10-CM

## 2019-05-05 DIAGNOSIS — Z7901 Long term (current) use of anticoagulants: Secondary | ICD-10-CM

## 2019-05-05 DIAGNOSIS — O9962 Diseases of the digestive system complicating childbirth: Secondary | ICD-10-CM | POA: Diagnosis present

## 2019-05-05 DIAGNOSIS — O365990 Maternal care for other known or suspected poor fetal growth, unspecified trimester, not applicable or unspecified: Secondary | ICD-10-CM

## 2019-05-05 DIAGNOSIS — R1909 Other intra-abdominal and pelvic swelling, mass and lump: Secondary | ICD-10-CM | POA: Diagnosis present

## 2019-05-05 DIAGNOSIS — Z8759 Personal history of other complications of pregnancy, childbirth and the puerperium: Secondary | ICD-10-CM

## 2019-05-05 DIAGNOSIS — E282 Polycystic ovarian syndrome: Secondary | ICD-10-CM | POA: Diagnosis present

## 2019-05-05 DIAGNOSIS — O9081 Anemia of the puerperium: Secondary | ICD-10-CM | POA: Diagnosis not present

## 2019-05-05 DIAGNOSIS — O26899 Other specified pregnancy related conditions, unspecified trimester: Secondary | ICD-10-CM

## 2019-05-05 DIAGNOSIS — Z86718 Personal history of other venous thrombosis and embolism: Secondary | ICD-10-CM | POA: Diagnosis not present

## 2019-05-05 DIAGNOSIS — E039 Hypothyroidism, unspecified: Secondary | ICD-10-CM | POA: Diagnosis present

## 2019-05-05 DIAGNOSIS — O99283 Endocrine, nutritional and metabolic diseases complicating pregnancy, third trimester: Secondary | ICD-10-CM

## 2019-05-05 DIAGNOSIS — Z20822 Contact with and (suspected) exposure to covid-19: Secondary | ICD-10-CM | POA: Diagnosis present

## 2019-05-05 DIAGNOSIS — O99284 Endocrine, nutritional and metabolic diseases complicating childbirth: Secondary | ICD-10-CM | POA: Diagnosis present

## 2019-05-05 DIAGNOSIS — O99892 Other specified diseases and conditions complicating childbirth: Secondary | ICD-10-CM | POA: Diagnosis present

## 2019-05-05 DIAGNOSIS — K219 Gastro-esophageal reflux disease without esophagitis: Secondary | ICD-10-CM | POA: Diagnosis present

## 2019-05-05 LAB — CBC
HCT: 34.5 % — ABNORMAL LOW (ref 36.0–46.0)
Hemoglobin: 11.5 g/dL — ABNORMAL LOW (ref 12.0–15.0)
MCH: 27.6 pg (ref 26.0–34.0)
MCHC: 33.3 g/dL (ref 30.0–36.0)
MCV: 82.7 fL (ref 80.0–100.0)
Platelets: 197 10*3/uL (ref 150–400)
RBC: 4.17 MIL/uL (ref 3.87–5.11)
RDW: 13.7 % (ref 11.5–15.5)
WBC: 10.4 10*3/uL (ref 4.0–10.5)
nRBC: 0 % (ref 0.0–0.2)

## 2019-05-05 LAB — COMPREHENSIVE METABOLIC PANEL
ALT: 21 U/L (ref 0–44)
AST: 17 U/L (ref 15–41)
Albumin: 2.5 g/dL — ABNORMAL LOW (ref 3.5–5.0)
Alkaline Phosphatase: 91 U/L (ref 38–126)
Anion gap: 11 (ref 5–15)
BUN: 8 mg/dL (ref 6–20)
CO2: 18 mmol/L — ABNORMAL LOW (ref 22–32)
Calcium: 8.4 mg/dL — ABNORMAL LOW (ref 8.9–10.3)
Chloride: 106 mmol/L (ref 98–111)
Creatinine, Ser: 0.88 mg/dL (ref 0.44–1.00)
GFR calc Af Amer: >60 mL/min (ref >60)
GFR calc non Af Amer: >60 mL/min (ref >60)
Glucose, Bld: 92 mg/dL (ref 70–99)
Potassium: 3.9 mmol/L (ref 3.5–5.1)
Sodium: 135 mmol/L (ref 135–145)
Total Bilirubin: 0.4 mg/dL (ref 0.3–1.2)
Total Protein: 5.8 g/dL — ABNORMAL LOW (ref 6.5–8.1)

## 2019-05-05 LAB — PROTEIN / CREATININE RATIO, URINE
Creatinine, Urine: 89.2 mg/dL
Protein Creatinine Ratio: 0.09 mg/mg{Cre} (ref 0.00–0.15)
Total Protein, Urine: 8 mg/dL

## 2019-05-05 LAB — SARS CORONAVIRUS 2 (TAT 6-24 HRS): SARS Coronavirus 2: NEGATIVE

## 2019-05-05 LAB — TYPE AND SCREEN
ABO/RH(D): A NEG
Antibody Screen: POSITIVE

## 2019-05-05 LAB — URIC ACID: Uric Acid, Serum: 6.4 mg/dL (ref 2.5–7.1)

## 2019-05-05 MED ORDER — LEVOTHYROXINE SODIUM 25 MCG PO TABS
25.0000 ug | ORAL_TABLET | Freq: Every day | ORAL | Status: DC
  Administered 2019-05-06 – 2019-05-10 (×5): 25 ug via ORAL
  Filled 2019-05-05 (×5): qty 1

## 2019-05-05 MED ORDER — CALCIUM CARBONATE ANTACID 500 MG PO CHEW
2.0000 | CHEWABLE_TABLET | ORAL | Status: DC | PRN
  Administered 2019-05-05: 400 mg via ORAL
  Filled 2019-05-05 (×2): qty 2

## 2019-05-05 MED ORDER — ACETAMINOPHEN 325 MG PO TABS
650.0000 mg | ORAL_TABLET | ORAL | Status: DC | PRN
  Administered 2019-05-07 – 2019-05-08 (×3): 650 mg via ORAL
  Filled 2019-05-05 (×4): qty 2

## 2019-05-05 MED ORDER — HYDRALAZINE HCL 50 MG PO TABS
25.0000 mg | ORAL_TABLET | Freq: Three times a day (TID) | ORAL | Status: DC
  Administered 2019-05-05 – 2019-05-08 (×7): 25 mg via ORAL
  Filled 2019-05-05 (×9): qty 1

## 2019-05-05 MED ORDER — LABETALOL HCL 200 MG PO TABS
400.0000 mg | ORAL_TABLET | Freq: Three times a day (TID) | ORAL | Status: DC
  Administered 2019-05-05 – 2019-05-10 (×14): 400 mg via ORAL
  Filled 2019-05-05 (×16): qty 2

## 2019-05-05 MED ORDER — METFORMIN HCL 500 MG PO TABS
500.0000 mg | ORAL_TABLET | Freq: Two times a day (BID) | ORAL | Status: DC
  Administered 2019-05-05: 500 mg via ORAL
  Filled 2019-05-05: qty 1

## 2019-05-05 MED ORDER — HEPARIN SODIUM (PORCINE) 5000 UNIT/ML IJ SOLN
5000.0000 [IU] | Freq: Three times a day (TID) | INTRAMUSCULAR | Status: AC
  Administered 2019-05-05 – 2019-05-06 (×2): 5000 [IU] via SUBCUTANEOUS
  Filled 2019-05-05 (×3): qty 1

## 2019-05-05 MED ORDER — DOCUSATE SODIUM 100 MG PO CAPS
100.0000 mg | ORAL_CAPSULE | Freq: Every day | ORAL | Status: DC
  Administered 2019-05-07 – 2019-05-09 (×3): 100 mg via ORAL
  Filled 2019-05-05 (×4): qty 1

## 2019-05-05 MED ORDER — PRENATAL MULTIVITAMIN CH
1.0000 | ORAL_TABLET | Freq: Every day | ORAL | Status: DC
  Administered 2019-05-07: 1 via ORAL
  Filled 2019-05-05: qty 1

## 2019-05-05 MED ORDER — PANTOPRAZOLE SODIUM 40 MG PO TBEC
40.0000 mg | DELAYED_RELEASE_TABLET | Freq: Every day | ORAL | Status: DC
  Administered 2019-05-05 – 2019-05-10 (×5): 40 mg via ORAL
  Filled 2019-05-05 (×5): qty 1

## 2019-05-05 NOTE — Progress Notes (Signed)
Report called by Estanislado Emms, RN to [place], RN.  Pt to go to [facility] per Dr. Ernestina Penna for plan of care.

## 2019-05-05 NOTE — H&P (Signed)
Sabrina White is a 34 y.o. female presenting for antepartum admission and delivery due to worsening maternal BPs with abnormal UADopplers, persistent absent diastolic flow in MFM office today.  Pt is 33 wks, IVF pregnancy with CHTN. C/section was planned at 34 wks for IUGR and AEDF but with change in maternal status with elevated BPs, earlier delivery was advised by MFM Dr Donalee Citrin today. NICU being full today, if NST is reactive and continuous in-house monitoring in cat I, delivery is planned tomorrow as long as maternal status remains stable. Denies HA/ vision changes/ RUQ pain/ CP/ SOB. Denies UCs. Has good FMs. denies vaginal bleeding, or  leaking fluid.  [facility] Dr Pamala Hurry from 10 wks. - IVF pregnancy, same sex couple, single FET, no PGS - DVT occurred during IVF with hormonal meds. Pt on Lovenox'120mg'$  bid due to continued active DVT, followed by vascular and DVT improving. No symptoms of VTE. Most recent DVT evaluation noted resolution of clot, so now she is on prophylactic doses of 60 mg daily.  - Hypothryoidism, stable labs, 75mg Synthroid. Last TSH nl 04/20/19 - Chronic htn. On Labetalol 200 tid until 26 wks, then increased to 400 tid and hydralizine '10mg'$  q6hrs. Pt is on baby ASA. Didn't tolerate Procardia (HA) - Baseline labs with ALT 50s, known fattly liver, urine P/C nl.  - PCOS, on Metformin through preg, Stopped for 1hr GTT at 26wks, DS 104 but will cont to treat with metformin (early DS at 12 wks 132) - A neg, s/p Rhogamat 28 wks - fetal growth 26 wks. 1'13/ 26%, AC 9%, BPP 6/8 (-2 breathing), nl UA dopplers.29 wks: 2'10/ 22% but AC at 3%. BPP 8/8, elevated UA dopplers. U/s 2/25: BPP 8/8, reactive NST, UA dopplers elevated with intermittent absent UA doppler. Post placenta, transverse position - BMZ 2/22 and 2/23   OB History    Gravida  1   Para      Term      Preterm      AB      Living  0     SAB      TAB      Ectopic      Multiple      Live  Births             Past Medical History:  Diagnosis Date  . Family history of breast cancer in mother 04/05/2017   Mother and maternal grandmother, not genetically tested. Paternal aunt, negative BRCA gene tested.  .Marland KitchenGERD (gastroesophageal reflux disease)   . Hypertension   . Hypothyroidism   . PCOS (polycystic ovarian syndrome)    No past surgical history on file. Family History: family history includes Breast cancer in her maternal grandmother, mother, and paternal aunt; Hypertension in her father. Social History:  reports that she has never smoked. She has never used smokeless tobacco. She reports current alcohol use. She reports that she does not use drugs.     Maternal Diabetes: No but on Metformin for PCOS and obesity Genetic Screening: Normal Maternal Ultrasounds/Referrals: Normal anatomy. IUGR, Abn UAD with absent end diastolic flow  Fetal Ultrasounds or other Referrals:  Referred to Materal Fetal Medicine  due to early growth restriction at 29 wks and abn Dopplers Maternal Substance Abuse:  No Significant Maternal Medications:  Meds include: Other: Synthroid, Labetalol and Hydralazine, bbASA, Metformin (PCOS), Lovenox high dose until last wk, now prophylactic.  BTMZ 2/22, 2/23.  Significant Maternal Lab Results:  Other:  Rh neg. Rhogam  in 3rd trim.  Other Comments:  IVF preg. this is same sex couple   Review of Systems no HA/ vision changes/ worse LE swelling/ Cp SOB vision changes  Exam Physical Exam  BP (!) 145/86   Pulse 78   Ht '5\' 5"'$  (1.651 m)   Wt 115.7 kg   LMP  (Exact Date)   BMI 42.43 kg/m  Physical exam:  A&O x 3, no acute distress. Pleasant HEENT neg, no thyromegaly Lungs CTA bilat CV RRR, S1S2 normal Abdo soft, non tender, non acute- gravid. Breech  Extr no edema/ tenderness Pelvic def FHT  150s + accels no decels, 10x10 beat variability- cat I/ reactive NST Toco none   Sono- MFM today- AFI 18 cm Breech. BPP 8/8. UAD- AEDF, no reversal noted.   Last growth on 3/5 MFM-  at 32 wks- 1636 gm, 3 lb 10 oz, 10%. UAD AEDF   Prenatal labs: ABO, Rh: --/--/A NEG (02/25 2015) Antibody: POS (02/25 2015) Rubella: Immune (10/13 0000) RPR: Nonreactive (10/13 0000)  HBsAg: Negative (10/13 0000)  HIV: Non-reactive (10/13 0000)  GBS:   sent today  Assessment/Plan: 33 yo G1,33 wks.  IVF pregnancy (same sex couple), with absent EDF and worsening BPs in setting of CHTN, H/o DVT in this pregnancy, hypothyroidism,  PCOS Rh neg NICU is full today and may have openings tomorrow per Dr Barbaraann Rondo. So in lieu of transferring mother to another facility for delivery, we chose to keep her on antepartum service with CEFM and proceed with delivery by primary C-section tomorrow if CEFM remains uneventful.  NPO after 4 AM for scheduled C/s at 2 pm tomorrow to follow other OR cases.  Lovenox prophylaxis 10,000 IU sq now and then restart Lovenox post-op.  Keep Bps stable. Fuig labs. Start Magnesium for severe range BPs Doesn't need neuroprophylaxis since 32+  S/p BTMZ 2/22, 2/23  Planning C/s for breech tomorrow.  Risks/complications of surgery reviewed incl infection, bleeding, damage to internal organs including bladder, bowels, ureters, blood vessels, other risks from anesthesia, VTE and delayed complications of any surgery, complications in future surgery reviewed. Also discussed neonatal issues with SGA and prematurity.   Elveria Royals 05/05/2019, 4:43 PM

## 2019-05-06 ENCOUNTER — Inpatient Hospital Stay (HOSPITAL_COMMUNITY): Payer: BC Managed Care – PPO | Admitting: Anesthesiology

## 2019-05-06 ENCOUNTER — Encounter (HOSPITAL_COMMUNITY): Payer: Self-pay | Admitting: Obstetrics & Gynecology

## 2019-05-06 ENCOUNTER — Inpatient Hospital Stay (HOSPITAL_COMMUNITY): Admit: 2019-05-06 | Payer: BC Managed Care – PPO | Admitting: Obstetrics & Gynecology

## 2019-05-06 ENCOUNTER — Encounter (HOSPITAL_COMMUNITY)
Admission: AD | Disposition: A | Discharge status: Home / Self Care | Payer: Self-pay | Source: Home / Self Care | Attending: Obstetrics & Gynecology

## 2019-05-06 DIAGNOSIS — O99892 Other specified diseases and conditions complicating childbirth: Secondary | ICD-10-CM | POA: Diagnosis present

## 2019-05-06 LAB — CBC
HCT: 32.8 % — ABNORMAL LOW (ref 36.0–46.0)
Hemoglobin: 10.8 g/dL — ABNORMAL LOW (ref 12.0–15.0)
MCH: 27.8 pg (ref 26.0–34.0)
MCHC: 32.9 g/dL (ref 30.0–36.0)
MCV: 84.5 fL (ref 80.0–100.0)
Platelets: 197 10*3/uL (ref 150–400)
RBC: 3.88 MIL/uL (ref 3.87–5.11)
RDW: 13.7 % (ref 11.5–15.5)
WBC: 8.4 10*3/uL (ref 4.0–10.5)
nRBC: 0 % (ref 0.0–0.2)

## 2019-05-06 LAB — RPR: RPR Ser Ql: NONREACTIVE

## 2019-05-06 SURGERY — Surgical Case
Anesthesia: Spinal

## 2019-05-06 MED ORDER — DIPHENHYDRAMINE HCL 50 MG/ML IJ SOLN
INTRAMUSCULAR | Status: AC
  Filled 2019-05-06: qty 1

## 2019-05-06 MED ORDER — CEFAZOLIN SODIUM-DEXTROSE 2-4 GM/100ML-% IV SOLN
2.0000 g | INTRAVENOUS | Status: AC
  Administered 2019-05-06: 2 g via INTRAVENOUS
  Filled 2019-05-06: qty 100

## 2019-05-06 MED ORDER — ONDANSETRON HCL 4 MG/2ML IJ SOLN
INTRAMUSCULAR | Status: AC
  Filled 2019-05-06: qty 2

## 2019-05-06 MED ORDER — NALBUPHINE HCL 10 MG/ML IJ SOLN: 5.0000 mg | INTRAMUSCULAR | Status: DC | PRN

## 2019-05-06 MED ORDER — MENTHOL 3 MG MT LOZG: 1.0000 | LOZENGE | OROMUCOSAL | Status: DC | PRN

## 2019-05-06 MED ORDER — FENTANYL CITRATE (PF) 100 MCG/2ML IJ SOLN
INTRAMUSCULAR | Status: AC
  Filled 2019-05-06: qty 2

## 2019-05-06 MED ORDER — COCONUT OIL OIL: 1.0000 "application " | TOPICAL_OIL | Status: DC | PRN

## 2019-05-06 MED ORDER — DIPHENHYDRAMINE HCL 25 MG PO CAPS
25.0000 mg | ORAL_CAPSULE | ORAL | Status: DC | PRN
  Filled 2019-05-06: qty 1

## 2019-05-06 MED ORDER — MORPHINE SULFATE (PF) 0.5 MG/ML IJ SOLN
INTRAMUSCULAR | Status: AC
  Filled 2019-05-06: qty 10

## 2019-05-06 MED ORDER — HYDRALAZINE HCL 20 MG/ML IJ SOLN
INTRAMUSCULAR | Status: AC
  Filled 2019-05-06: qty 1

## 2019-05-06 MED ORDER — METOCLOPRAMIDE HCL 5 MG/ML IJ SOLN
INTRAMUSCULAR | Status: DC | PRN
  Administered 2019-05-06: 10 mg via INTRAVENOUS

## 2019-05-06 MED ORDER — IBUPROFEN 600 MG PO TABS
600.0000 mg | ORAL_TABLET | Freq: Four times a day (QID) | ORAL | Status: DC | PRN
  Administered 2019-05-07 – 2019-05-08 (×4): 600 mg via ORAL
  Filled 2019-05-06 (×5): qty 1

## 2019-05-06 MED ORDER — PROMETHAZINE HCL 25 MG/ML IJ SOLN: 6.2500 mg | INTRAMUSCULAR | Status: DC | PRN

## 2019-05-06 MED ORDER — HYDROMORPHONE HCL 1 MG/ML IJ SOLN: 0.2500 mg | INTRAMUSCULAR | Status: DC | PRN

## 2019-05-06 MED ORDER — SENNOSIDES-DOCUSATE SODIUM 8.6-50 MG PO TABS
2.0000 | ORAL_TABLET | ORAL | Status: DC
  Administered 2019-05-07 – 2019-05-09 (×4): 2 via ORAL
  Filled 2019-05-06 (×4): qty 2

## 2019-05-06 MED ORDER — OXYTOCIN 40 UNITS IN NORMAL SALINE INFUSION - SIMPLE MED
INTRAVENOUS | Status: DC | PRN
  Administered 2019-05-06: 40 [IU] via INTRAVENOUS

## 2019-05-06 MED ORDER — DIBUCAINE (PERIANAL) 1 % EX OINT: 1.0000 "application " | TOPICAL_OINTMENT | CUTANEOUS | Status: DC | PRN

## 2019-05-06 MED ORDER — LACTATED RINGERS IV SOLN: INTRAVENOUS | Status: DC | PRN

## 2019-05-06 MED ORDER — NALBUPHINE HCL 10 MG/ML IJ SOLN: 5.0000 mg | Freq: Once | INTRAMUSCULAR | Status: DC | PRN

## 2019-05-06 MED ORDER — SOD CITRATE-CITRIC ACID 500-334 MG/5ML PO SOLN
ORAL | Status: AC
  Filled 2019-05-06: qty 15

## 2019-05-06 MED ORDER — PRENATAL MULTIVITAMIN CH
1.0000 | ORAL_TABLET | Freq: Every day | ORAL | Status: DC
  Administered 2019-05-08 – 2019-05-09 (×2): 1 via ORAL
  Filled 2019-05-06 (×2): qty 1

## 2019-05-06 MED ORDER — OXYTOCIN 40 UNITS IN NORMAL SALINE INFUSION - SIMPLE MED
INTRAVENOUS | Status: AC
  Filled 2019-05-06: qty 1000

## 2019-05-06 MED ORDER — TETANUS-DIPHTH-ACELL PERTUSSIS 5-2.5-18.5 LF-MCG/0.5 IM SUSP: 0.5000 mL | Freq: Once | INTRAMUSCULAR | Status: DC

## 2019-05-06 MED ORDER — METOCLOPRAMIDE HCL 5 MG/ML IJ SOLN
INTRAMUSCULAR | Status: AC
  Filled 2019-05-06: qty 2

## 2019-05-06 MED ORDER — OXYTOCIN 40 UNITS IN NORMAL SALINE INFUSION - SIMPLE MED
2.5000 [IU]/h | INTRAVENOUS | Status: DC
  Administered 2019-05-06: 2.5 [IU]/h via INTRAVENOUS

## 2019-05-06 MED ORDER — SCOPOLAMINE 1 MG/3DAYS TD PT72: 1.0000 | MEDICATED_PATCH | Freq: Once | TRANSDERMAL | Status: DC

## 2019-05-06 MED ORDER — KETOROLAC TROMETHAMINE 30 MG/ML IJ SOLN: 30.0000 mg | Freq: Once | INTRAMUSCULAR | Status: DC | PRN

## 2019-05-06 MED ORDER — PHENYLEPHRINE HCL-NACL 20-0.9 MG/250ML-% IV SOLN
INTRAVENOUS | Status: DC | PRN
  Administered 2019-05-06: 60 ug/min via INTRAVENOUS

## 2019-05-06 MED ORDER — NALOXONE HCL 4 MG/10ML IJ SOLN
1.0000 ug/kg/h | INTRAVENOUS | Status: DC | PRN
  Filled 2019-05-06: qty 5

## 2019-05-06 MED ORDER — SODIUM CHLORIDE 0.9% FLUSH: 3.0000 mL | INTRAVENOUS | Status: DC | PRN

## 2019-05-06 MED ORDER — SIMETHICONE 80 MG PO CHEW
80.0000 mg | CHEWABLE_TABLET | Freq: Three times a day (TID) | ORAL | Status: DC
  Administered 2019-05-07 – 2019-05-10 (×10): 80 mg via ORAL
  Filled 2019-05-06 (×9): qty 1

## 2019-05-06 MED ORDER — HYDRALAZINE HCL 20 MG/ML IJ SOLN
5.0000 mg | Freq: Once | INTRAMUSCULAR | Status: AC
  Administered 2019-05-06: 5 mg via INTRAVENOUS

## 2019-05-06 MED ORDER — SIMETHICONE 80 MG PO CHEW
80.0000 mg | CHEWABLE_TABLET | ORAL | Status: DC
  Administered 2019-05-07 – 2019-05-09 (×4): 80 mg via ORAL
  Filled 2019-05-06 (×4): qty 1

## 2019-05-06 MED ORDER — ONDANSETRON HCL 4 MG/2ML IJ SOLN: 4.0000 mg | Freq: Three times a day (TID) | INTRAMUSCULAR | Status: DC | PRN

## 2019-05-06 MED ORDER — LACTATED RINGERS IV SOLN: INTRAVENOUS | Status: DC

## 2019-05-06 MED ORDER — DIPHENHYDRAMINE HCL 25 MG PO CAPS
25.0000 mg | ORAL_CAPSULE | Freq: Four times a day (QID) | ORAL | Status: DC | PRN
  Administered 2019-05-09: 25 mg via ORAL

## 2019-05-06 MED ORDER — CEFAZOLIN SODIUM-DEXTROSE 2-4 GM/100ML-% IV SOLN
INTRAVENOUS | Status: AC
  Filled 2019-05-06: qty 100

## 2019-05-06 MED ORDER — ONDANSETRON HCL 4 MG/2ML IJ SOLN
INTRAMUSCULAR | Status: DC | PRN
  Administered 2019-05-06: 4 mg via INTRAVENOUS

## 2019-05-06 MED ORDER — WITCH HAZEL-GLYCERIN EX PADS: 1.0000 "application " | MEDICATED_PAD | CUTANEOUS | Status: DC | PRN

## 2019-05-06 MED ORDER — DIPHENHYDRAMINE HCL 50 MG/ML IJ SOLN
INTRAMUSCULAR | Status: DC | PRN
  Administered 2019-05-06: 25 mg via INTRAVENOUS

## 2019-05-06 MED ORDER — ENOXAPARIN SODIUM 60 MG/0.6ML ~~LOC~~ SOLN
60.0000 mg | SUBCUTANEOUS | Status: DC
  Administered 2019-05-07 – 2019-05-09 (×3): 60 mg via SUBCUTANEOUS
  Filled 2019-05-06 (×3): qty 0.6

## 2019-05-06 MED ORDER — BUPIVACAINE IN DEXTROSE 0.75-8.25 % IT SOLN
INTRATHECAL | Status: DC | PRN
  Administered 2019-05-06: 1.6 mL via INTRATHECAL

## 2019-05-06 MED ORDER — FENTANYL CITRATE (PF) 100 MCG/2ML IJ SOLN
INTRAMUSCULAR | Status: DC | PRN
  Administered 2019-05-06: 15 ug via INTRAVENOUS

## 2019-05-06 MED ORDER — NALOXONE HCL 0.4 MG/ML IJ SOLN: 0.4000 mg | INTRAMUSCULAR | Status: DC | PRN

## 2019-05-06 MED ORDER — DIPHENHYDRAMINE HCL 50 MG/ML IJ SOLN: 12.5000 mg | Freq: Four times a day (QID) | INTRAMUSCULAR | Status: DC | PRN

## 2019-05-06 MED ORDER — MEPERIDINE HCL 25 MG/ML IJ SOLN: 6.2500 mg | INTRAMUSCULAR | Status: DC | PRN

## 2019-05-06 MED ORDER — MORPHINE SULFATE (PF) 0.5 MG/ML IJ SOLN
INTRAMUSCULAR | Status: DC | PRN
  Administered 2019-05-06: .15 mg via EPIDURAL

## 2019-05-06 MED ORDER — DEXAMETHASONE SODIUM PHOSPHATE 4 MG/ML IJ SOLN
INTRAMUSCULAR | Status: DC | PRN
  Administered 2019-05-06: 4 mg via INTRAVENOUS

## 2019-05-06 MED ORDER — ACETAMINOPHEN 500 MG PO TABS
1000.0000 mg | ORAL_TABLET | Freq: Once | ORAL | Status: AC
  Administered 2019-05-06: 1000 mg via ORAL
  Filled 2019-05-06: qty 2

## 2019-05-06 MED ORDER — METFORMIN HCL 500 MG PO TABS
500.0000 mg | ORAL_TABLET | Freq: Every day | ORAL | Status: DC
  Administered 2019-05-07 – 2019-05-10 (×4): 500 mg via ORAL
  Filled 2019-05-06 (×4): qty 1

## 2019-05-06 MED ORDER — ASPIRIN EC 81 MG PO TBEC
81.0000 mg | DELAYED_RELEASE_TABLET | Freq: Every day | ORAL | Status: DC
  Administered 2019-05-07 – 2019-05-10 (×4): 81 mg via ORAL
  Filled 2019-05-06 (×4): qty 1

## 2019-05-06 MED ORDER — SODIUM CHLORIDE 0.9 % IV SOLN: INTRAVENOUS | Status: DC | PRN

## 2019-05-06 MED ORDER — SIMETHICONE 80 MG PO CHEW: 80.0000 mg | CHEWABLE_TABLET | ORAL | Status: DC | PRN

## 2019-05-06 SURGICAL SUPPLY — 38 items
BENZOIN TINCTURE PRP APPL 2/3 (GAUZE/BANDAGES/DRESSINGS) ×3 IMPLANT
CHLORAPREP W/TINT 26ML (MISCELLANEOUS) ×3 IMPLANT
CLAMP CORD UMBIL (MISCELLANEOUS) IMPLANT
CLOSURE WOUND 1/2 X4 (GAUZE/BANDAGES/DRESSINGS) ×1
CLOTH BEACON ORANGE TIMEOUT ST (SAFETY) ×3 IMPLANT
DRESSING PREVENA PLUS CUSTOM (GAUZE/BANDAGES/DRESSINGS) ×1 IMPLANT
DRSG OPSITE POSTOP 4X10 (GAUZE/BANDAGES/DRESSINGS) ×3 IMPLANT
DRSG PREVENA PLUS CUSTOM (GAUZE/BANDAGES/DRESSINGS) ×3
ELECT REM PT RETURN 9FT ADLT (ELECTROSURGICAL) ×3
ELECTRODE REM PT RTRN 9FT ADLT (ELECTROSURGICAL) ×1 IMPLANT
EXTRACTOR VACUUM KIWI (MISCELLANEOUS) IMPLANT
EXTRACTOR VACUUM M CUP 4 TUBE (SUCTIONS) IMPLANT
EXTRACTOR VACUUM M CUP 4' TUBE (SUCTIONS)
GLOVE BIO SURGEON STRL SZ7 (GLOVE) ×3 IMPLANT
GLOVE BIOGEL PI IND STRL 7.0 (GLOVE) ×2 IMPLANT
GLOVE BIOGEL PI INDICATOR 7.0 (GLOVE) ×4
GOWN STRL REUS W/TWL LRG LVL3 (GOWN DISPOSABLE) ×6 IMPLANT
KIT ABG SYR 3ML LUER SLIP (SYRINGE) IMPLANT
NEEDLE HYPO 25X5/8 SAFETYGLIDE (NEEDLE) IMPLANT
NS IRRIG 1000ML POUR BTL (IV SOLUTION) ×3 IMPLANT
PACK C SECTION WH (CUSTOM PROCEDURE TRAY) ×3 IMPLANT
PAD OB MATERNITY 4.3X12.25 (PERSONAL CARE ITEMS) ×3 IMPLANT
RTRCTR C-SECT PINK 25CM LRG (MISCELLANEOUS) IMPLANT
STRIP CLOSURE SKIN 1/2X4 (GAUZE/BANDAGES/DRESSINGS) ×2 IMPLANT
SUT MNCRL 0 VIOLET CTX 36 (SUTURE) ×2 IMPLANT
SUT MONOCRYL 0 CTX 36 (SUTURE) ×4
SUT PLAIN 0 NONE (SUTURE) IMPLANT
SUT PLAIN 2 0 (SUTURE)
SUT PLAIN ABS 2-0 CT1 27XMFL (SUTURE) IMPLANT
SUT VIC AB 0 CT1 27 (SUTURE) ×4
SUT VIC AB 0 CT1 27XBRD ANBCTR (SUTURE) ×2 IMPLANT
SUT VIC AB 2-0 CT1 27 (SUTURE) ×2
SUT VIC AB 2-0 CT1 TAPERPNT 27 (SUTURE) ×1 IMPLANT
SUT VIC AB 4-0 KS 27 (SUTURE) ×3 IMPLANT
SUT VICRYL 0 TIES 12 18 (SUTURE) IMPLANT
TOWEL OR 17X24 6PK STRL BLUE (TOWEL DISPOSABLE) ×3 IMPLANT
TRAY FOLEY W/BAG SLVR 14FR LF (SET/KITS/TRAYS/PACK) IMPLANT
WATER STERILE IRR 1000ML POUR (IV SOLUTION) ×3 IMPLANT

## 2019-05-06 NOTE — Anesthesia Preprocedure Evaluation (Addendum)
Anesthesia Evaluation  Patient identified by MRN, date of birth, ID band Patient awake    Reviewed: Allergy & Precautions, NPO status , Patient's Chart, lab work & pertinent test results  Airway Mallampati: III  TM Distance: >3 FB Neck ROM: Full    Dental no notable dental hx.    Pulmonary neg pulmonary ROS,    Pulmonary exam normal breath sounds clear to auscultation       Cardiovascular hypertension, Pt. on medications and Pt. on home beta blockers + DVT  Normal cardiovascular exam Rhythm:Regular Rate:Normal     Neuro/Psych negative neurological ROS  negative psych ROS   GI/Hepatic Neg liver ROS, GERD  Medicated and Controlled,  Endo/Other  Hypothyroidism Morbid obesityPCOS (polycystic ovarian syndrome)  Renal/GU negative Renal ROS     Musculoskeletal negative musculoskeletal ROS (+)   Abdominal (+) + obese,   Peds  Hematology  (+) anemia , Last Lovenox: 60 mg x 1 on 3/11    Anesthesia Other Findings 33wk IUGR inc BP IVF  Reproductive/Obstetrics (+) Pregnancy                            Anesthesia Physical Anesthesia Plan  ASA: III  Anesthesia Plan: Spinal   Post-op Pain Management:    Induction:   PONV Risk Score and Plan: Dexamethasone and Treatment may vary due to age or medical condition  Airway Management Planned: Natural Airway  Additional Equipment:   Intra-op Plan:   Post-operative Plan:   Informed Consent: I have reviewed the patients History and Physical, chart, labs and discussed the procedure including the risks, benefits and alternatives for the proposed anesthesia with the patient or authorized representative who has indicated his/her understanding and acceptance.     Dental advisory given  Plan Discussed with: CRNA  Anesthesia Plan Comments:        Anesthesia Quick Evaluation

## 2019-05-06 NOTE — Anesthesia Postprocedure Evaluation (Signed)
Anesthesia Post Note  Patient: Sheretha Shadd Palleschi  Procedure(s) Performed: CESAREAN SECTION (N/A )     Patient location during evaluation: PACU Anesthesia Type: Spinal Level of consciousness: oriented and awake and alert Pain management: pain level controlled Vital Signs Assessment: post-procedure vital signs reviewed and stable Respiratory status: spontaneous breathing, respiratory function stable and patient connected to nasal cannula oxygen Cardiovascular status: blood pressure returned to baseline and stable Postop Assessment: no headache, no backache, no apparent nausea or vomiting and spinal receding Anesthetic complications: no    Last Vitals:  Vitals:   05/06/19 1955 05/06/19 2100  BP:  (!) 141/88  Pulse:  71  Resp:  18  Temp:  36.4 C  SpO2: 99% 98%    Last Pain:  Vitals:   05/06/19 2100  TempSrc: Oral  PainSc:    Pain Goal: Patients Stated Pain Goal: 2 (05/06/19 1840)                 Alycia Rossetti P Yassmin Binegar

## 2019-05-06 NOTE — Brief Op Note (Signed)
05/05/2019 - 05/06/2019  3:57 PM  PATIENT:  Sabrina White  34 y.o. female  PRE-OPERATIVE DIAGNOSIS:  Absent diastolic flow, fetal heart rate indication Intrauterine growth restriction, increased maternal blood pressures, chronic hypertension  POST-OPERATIVE DIAGNOSIS:  Absent diastolic flow, fetal heart rate indication Intrauterine growth restriction, increased maternal blood pressures; excision of right adnexal mass  PROCEDURE:  Procedure(s): CESAREAN SECTION (N/A)  Primary classical cesarean section with 3 layer closure Excision of right adnexal mass  SURGEON:  Surgeon(s) and Role:    * Noland Fordyce, MD - Primary    * Shea Evans, MD  PHYSICIAN ASSISTANT:   ASSISTANTS: As above  ANESTHESIA:   general  EBL: Per anesthesia  BLOOD ADMINISTERED:none  DRAINS: Urinary Catheter (Foley)   LOCAL MEDICATIONS USED:  NONE  SPECIMEN:  Source of Specimen:  Placenta  DISPOSITION OF SPECIMEN:  Pathology  COUNTS:  YES  TOURNIQUET:  * No tourniquets in log *  DICTATION: .Note written in EPIC  PLAN OF CARE: Admit to inpatient   PATIENT DISPOSITION:  PACU - hemodynamically stable.   Delay start of Pharmacological VTE agent (>24hrs) due to surgical blood loss or risk of bleeding: yes

## 2019-05-06 NOTE — Op Note (Signed)
05/05/2019 - 05/06/2019  3:57 PM  PATIENT:  Sabrina White  34 y.o. female  PRE-OPERATIVE DIAGNOSIS:  Absent diastolic flow, fetal heart rate indication Intrauterine growth restriction, increased maternal blood pressures, chronic hypertension  POST-OPERATIVE DIAGNOSIS:  Absent diastolic flow, fetal heart rate indication Intrauterine growth restriction, increased maternal blood pressures; excision of right adnexal mass  PROCEDURE:  Procedure(s): CESAREAN SECTION (N/A)  Primary classical cesarean section with 3 layer closure Excision of right adnexal mass  SURGEON:  Surgeon(s) and Role:    * Noland Fordyce, MD - Primary    * Shea Evans, MD  PHYSICIAN ASSISTANT:   ASSISTANTS: As above  ANESTHESIA:   general  EBL: Per anesthesia  BLOOD ADMINISTERED:none  DRAINS: Urinary Catheter (Foley)   LOCAL MEDICATIONS USED:  NONE  SPECIMEN:  Source of Specimen:  Placenta  DISPOSITION OF SPECIMEN:  Pathology  COUNTS:  YES  TOURNIQUET:  * No tourniquets in log *  DICTATION: .Note written in EPIC  PLAN OF CARE: Admit to inpatient   PATIENT DISPOSITION:  PACU - hemodynamically stable.   Delay start of Pharmacological VTE agent (>24hrs) due to surgical blood loss or risk of bleeding: yes     Findings:  @BABYSEXEBC @ infant,  APGAR (1 MIN):   APGAR (5 MINS):   APGAR (10 MINS):   Normal uterus, left tube and ovary, at the junction of the right distal tube and the ovary was a solid mass resembling ovarian tissue, normal placenta. 3VC, clear amniotic fluid  EBL: Per anesthesia cc Antibiotics:   2g Ancef Complications: none  Indications: This is a 34 y.o. year-old, G1 at [redacted]w[redacted]d admitted for worsening chronic hypertension without preeclampsia in the setting of intrauterine growth restriction with progression from elevated Dopplers to absent Dopplers on diastolic flow. Risks benefits and alternatives of the procedure were discussed with the patient who agreed to  proceed  Procedure:  After informed consent was obtained the patient was taken to the operating room where spinal anesthesia was initiated.  She was prepped and draped in the normal sterile fashion in dorsal supine position with a leftward tilt.  A foley catheter was in place.  A Pfannenstiel skin incision was made 2 cm above the pubic symphysis in the midline with the scalpel.  Dissection was carried down with the Bovie cautery until the fascia was reached. The fascia was incised in the midline. The incision was extended laterally with the Mayo scissors. The inferior aspect of the fascial incision was grasped with the Coker clamps, elevated up and the underlying rectus muscles were dissected off sharply. The superior aspect of the fascial incision was grasped with the Coker clamps elevated up and the underlying rectus muscles were dissected off sharply.  The peritoneum was entered bluntly. The peritoneal incision was extended superiorly and inferiorly with good visualization of the bladder.  An Alexis self-retaining tractor was placed  palpation was done to assess the fetal position and the location of the uterine vessels.  An underdeveloped lower uterine segment was found as expected given the growth restriction in the breech presentation.  Surgical mapping was carried out and the decision was made to proceed with a classical cesarean section. . The scalpel was used to make a vertical incision in the uterus.  Due to the thicker uterine tissue Allis clamps were placed on the edges and used to elevate the edges.  Once the amniotic sac was encountered bandage scissors were used to extend the incision superiorly and inferiorly.  Amniotic sac was then  broken.  Infant sacrum was grasped and brought to the incision.  Baby was delivered from a frank breech position.  Arms were swept across the midline as the shoulders were delivered.  Finger was placed in the babies mouth to keep the head in flexion and the head was  delivered.  Poor tone and no respiratory effort were noted.  The cord was clamped and cut rapidly and the baby was handed off to the pediatrician.  Cord gas and cord blood were obtained. The uterus was cleared of all clots and debris. The uterine incision was repaired with 0 Vicryl in a running locked fashion.  A second layer of the same suture was used in an imbricating fashion to obtain excellent hemostasis.  A 3-0 Vicryl on a CT 2 needle was used to close the serosal layer in a baseball stitch fashion.  T the gutters were cleared of all clots and debris.  The tubes and ovaries were evaluated.  The mass on the distal end of the right tube was excised with Bovie cautery.  Hemostasis was noted.  The uterine incision was reinspected and found to be hemostatic.  A 1/2 piece of Interceed was placed on the uterine incision in a vertical fashion.  A second half was placed in a horizontal fashion.  The peritoneum was grasped and closed with 2-0 Vicryl in a running fashion. The cut muscle edges and the underside of the fascia were inspected and found to be hemostatic. The fascia was closed with 0 Vicryl in 2 halves. The subcutaneous tissue was irrigated. Scarpa's layer was closed with a 2-0 plain gut suture. The skin was closed with a 4-0 Monocryl in a single layer. The patient tolerated the procedure well. Sponge lap and needle counts were correct x3 and patient was taken to the recovery room in a stable condition.  Ala Dach 05/06/2019 3:58 PM

## 2019-05-06 NOTE — Anesthesia Procedure Notes (Signed)
Spinal  Patient location during procedure: OR Start time: 05/06/2019 2:25 PM End time: 05/06/2019 2:30 PM Staffing Performed: anesthesiologist  Anesthesiologist: Leonides Grills, MD Preanesthetic Checklist Completed: patient identified, IV checked, risks and benefits discussed, surgical consent, monitors and equipment checked, pre-op evaluation and timeout performed Spinal Block Patient position: sitting Prep: DuraPrep Patient monitoring: cardiac monitor, continuous pulse ox and blood pressure Approach: midline Location: L4-5 Injection technique: single-shot Needle Needle type: Pencan  Needle gauge: 24 G Needle length: 9 cm Assessment Sensory level: T10 Additional Notes Functioning IV was confirmed and monitors were applied. Sterile prep and drape, including hand hygiene and sterile gloves were used. The patient was positioned and the spine was prepped. The skin was anesthetized with lidocaine.  Free flow of clear CSF was obtained prior to injecting local anesthetic into the CSF.  The spinal needle aspirated freely following injection.  The needle was carefully withdrawn.  The patient tolerated the procedure well.

## 2019-05-06 NOTE — Progress Notes (Signed)
Dr. Juliene Pina made aware of prolonged deceleration at 2330 and variable at 0142. Per Dr. Juliene Pina, if decelerations happen again to start LR @100  ml/hour. Will continue to monitor.

## 2019-05-06 NOTE — Progress Notes (Addendum)
Patient ID: Sabrina White, female   DOB: 12/14/1985, 34 y.o.   MRN: [id number] HD#2, IUGR with abn fetal UAD- AEDF and worsening maternal BPs.  Overnight stay with CEFM, now noting intermittent variable decel with slow recovery, suggesting failing placental function. Emerg C/s last night was held to allow room in NICU in lieu of maternal transfer.  Pt denies any complaints. No HA/ CP/ SOB/ LE pain or swelling/ visual scotomas. +FMs. NO UCs/ VB/LOF  BP 125/73 (BP Location: Left Arm)   Pulse 90   Temp 97.6 F (36.4 C) (Oral)   Resp 17   Ht 5\' 5"  (1.651 m)   Wt 115.7 kg   LMP  (Exact Date)   SpO2 100%   BMI 42.43 kg/m  Patient Vitals for the past 24 hrs:  BP Temp Temp src Pulse Resp SpO2 Height Weight  05/06/19 0804 125/73 97.6 F (36.4 C) Oral 90 17 100 % -- --  05/06/19 0557 (!) 156/93 97.8 F (36.6 C) Oral 76 16 100 % -- --  05/05/19 2323 111/85 98.6 F (37 C) Oral 85 18 100 % -- --  05/05/19 1948 (!) 141/75 98.3 F (36.8 C) Oral 80 18 100 % -- --  05/05/19 1836 -- -- -- -- -- -- 5\' 5"  (1.651 m) 115.7 kg  05/05/19 1703 (!) 145/86 -- -- 78 -- -- -- --   Exam- A&O x3 , NAD Lungs CTA bilat CV RRR Abdo gravid, soft, palpable FMs Extr no C/C/ E or tenderness. SCDs on  FHT 140-150s, 10-15 beat variability/ + accels/ + decels, intermittent (not every hour) variable decel with slow recovery over 2-3 min.  Toco- none  CBC Latest Ref Rng & Units 05/05/2019 04/23/2019 04/20/2019  WBC 4.0 - 10.5 K/uL 10.4 11.7(H) 14.9(H)  Hemoglobin 12.0 - 15.0 g/dL 11.5(L) 11.6(L) 11.6(L)  Hematocrit 36.0 - 46.0 % 34.5(L) 35.8(L) 35.0(L)  Platelets 150 - 400 K/uL 197 208 233   CMP     Component Value Date/Time   NA 135 05/05/2019 1806   K 3.9 05/05/2019 1806   CL 106 05/05/2019 1806   CO2 18 (L) 05/05/2019 1806   GLUCOSE 92 05/05/2019 1806   BUN 8 05/05/2019 1806   CREATININE 0.88 05/05/2019 1806   CALCIUM 8.4 (L) 05/05/2019 1806   PROT 5.8 (L) 05/05/2019 1806   ALBUMIN 2.5 (L) 05/05/2019  1806   AST 17 05/05/2019 1806   ALT 21 05/05/2019 1806   ALKPHOS 91 05/05/2019 1806   BILITOT 0.4 05/05/2019 1806   GFRNONAA >60 05/05/2019 1806   GFRAA >60 05/05/2019 1806   Urine P/C nl  A/P: 34 yo, G1, 33.1 wks, IVF pregnancy. IUGR, with abn UAD- AEDF. Scheduled for C-section for breech.  Prematurity - s/p BMTZ 2/22,23. NICU accepting baby this morning, EFW 3'10" per last wk sono. Breech.  CHTN with worsening BPs, no neural s/s, not on magnesium. Labs nl. Meds- Labetalol 400mg  tid and Hydralazine 10mg  q6hrs DVT in 1st trim, IVF hormone related- on treatment doses but switched to Prophylactic 60mg  SQ recently since clot resolved, will cont PP for 6 weeks  Hypothyroid - on Synthroid PCOS- plans to breast feed, will resume Metformin tomorrow  GERD - Pantoprazole  Rh neg- rhogam work up PP  Risks/complications of surgery reviewed incl infection, bleeding, damage to internal organs including bladder, bowels, ureters, blood vessels, other risks from anesthesia, VTE and delayed complications of any surgery, complications in future surgery reviewed. Also discussed neonatal complications due to prematurity and SGA,  prior NICU consult, pt understands and agrees  Sabrina Dadisman,MD

## 2019-05-06 NOTE — Transfer of Care (Signed)
Immediate Anesthesia Transfer of Care Note  Patient: Sabrina White  Procedure(s) Performed: CESAREAN SECTION (N/A )  Patient Location: PACU  Anesthesia Type:Spinal  Level of Consciousness: awake  Airway & Oxygen Therapy: Patient Spontanous Breathing  Post-op Assessment: Report given to RN  Post vital signs: Reviewed and stable  Last Vitals:  Vitals Value Taken Time  BP 141/88 05/06/19 2101  Temp 36.4 C 05/06/19 2100  Pulse 71 05/06/19 2101  Resp 18 05/06/19 2100  SpO2 98 % 05/06/19 2100    Last Pain:  Vitals:   05/06/19 2100  TempSrc: Oral  PainSc:       Patients Stated Pain Goal: 2 (05/06/19 1840)  Complications: No apparent anesthesia complications

## 2019-05-07 LAB — COMPREHENSIVE METABOLIC PANEL
ALT: 17 U/L (ref 0–44)
AST: 15 U/L (ref 15–41)
Albumin: 2 g/dL — ABNORMAL LOW (ref 3.5–5.0)
Alkaline Phosphatase: 69 U/L (ref 38–126)
Anion gap: 9 (ref 5–15)
BUN: 8 mg/dL (ref 6–20)
CO2: 19 mmol/L — ABNORMAL LOW (ref 22–32)
Calcium: 8.3 mg/dL — ABNORMAL LOW (ref 8.9–10.3)
Chloride: 103 mmol/L (ref 98–111)
Creatinine, Ser: 0.79 mg/dL (ref 0.44–1.00)
GFR calc Af Amer: >60 mL/min (ref >60)
GFR calc non Af Amer: >60 mL/min (ref >60)
Glucose, Bld: 126 mg/dL — ABNORMAL HIGH (ref 70–99)
Potassium: 4.6 mmol/L (ref 3.5–5.1)
Sodium: 131 mmol/L — ABNORMAL LOW (ref 135–145)
Total Bilirubin: 0.1 mg/dL — ABNORMAL LOW (ref 0.3–1.2)
Total Protein: 5 g/dL — ABNORMAL LOW (ref 6.5–8.1)

## 2019-05-07 LAB — CBC
HCT: 29.6 % — ABNORMAL LOW (ref 36.0–46.0)
Hemoglobin: 9.7 g/dL — ABNORMAL LOW (ref 12.0–15.0)
MCH: 27.6 pg (ref 26.0–34.0)
MCHC: 32.8 g/dL (ref 30.0–36.0)
MCV: 84.1 fL (ref 80.0–100.0)
Platelets: 180 10*3/uL (ref 150–400)
RBC: 3.52 MIL/uL — ABNORMAL LOW (ref 3.87–5.11)
RDW: 13.7 % (ref 11.5–15.5)
WBC: 13.8 10*3/uL — ABNORMAL HIGH (ref 4.0–10.5)
nRBC: 0 % (ref 0.0–0.2)

## 2019-05-07 LAB — CULTURE, BETA STREP (GROUP B ONLY)

## 2019-05-07 MED ORDER — OXYCODONE-ACETAMINOPHEN 5-325 MG PO TABS
1.0000 | ORAL_TABLET | ORAL | Status: DC | PRN
  Administered 2019-05-07: 1 via ORAL
  Administered 2019-05-08 (×2): 2 via ORAL
  Filled 2019-05-07: qty 2
  Filled 2019-05-07: qty 1
  Filled 2019-05-07: qty 2

## 2019-05-07 NOTE — Progress Notes (Signed)
Patient ID: Sabrina White, female   DOB: 30-Oct-1985, 34 y.o.   MRN: [id number] Saw pt in NICU   HD#3, Post-partum/ POD#1, s/p Urgent Classical C-section for IUGR with AEDF and worsening maternal BPs at 33.1 wks.   Feels well, tolerated regular diet. No voids since foley removed about 1.1/2 hr back. Reviewed need to void at 4-5 hrs or inform RN. No pain now. Denies HA/ CP/ SOB/ LE pain or swelling/ visual scotomas.   BP 133/79 (BP Location: Right Arm)   Pulse 79   Temp 98.1 F (36.7 C) (Oral)   Resp 18   Ht 5\' 5"  (1.651 m)   Wt 115.7 kg   LMP  (Exact Date)   SpO2 99%   BMI 42.43 kg/m  Patient Vitals for the past 24 hrs:  BP Temp Temp src Pulse Resp SpO2  05/07/19 1050 133/79 98.1 F (36.7 C) Oral 79 18 99 %  05/07/19 0825 -- -- -- -- -- 99 %  05/07/19 0822 120/77 98.1 F (36.7 C) Oral 73 18 99 %  05/07/19 0432 116/71 98.1 F (36.7 C) Oral 80 18 100 %  05/07/19 0031 140/85 -- -- 86 -- --  05/07/19 0030 -- -- -- -- -- 100 %  05/07/19 0029 130/78 -- -- 98 -- --  05/07/19 0027 (!) 143/84 98 F (36.7 C) -- 85 18 --  05/07/19 0025 -- -- -- -- -- 99 %  05/07/19 0023 136/82 98.2 F (36.8 C) Oral 75 18 99 %  05/06/19 2100 (!) 141/88 97.6 F (36.4 C) Oral 71 18 98 %  05/06/19 1955 -- -- -- -- -- 99 %  05/06/19 1935 -- -- -- -- -- 100 %  05/06/19 1900 (!) 149/99 -- -- 68 -- 100 %  05/06/19 1840 -- -- -- -- -- 100 %  05/06/19 1839 (!) 153/87 (!) 97.1 F (36.2 C) Oral (!) 58 17 --  05/06/19 1736 (!) 158/96 (!) 97 F (36.1 C) Axillary 64 18 --  05/06/19 1715 (!) 153/85 -- -- 68 12 100 %  05/06/19 1701 (!) 151/100 97.6 F (36.4 C) Axillary 67 11 99 %  05/06/19 1645 (!) 156/95 -- -- (!) 59 14 98 %  05/06/19 1631 (!) 149/105 -- -- 71 15 99 %  05/06/19 1615 (!) 159/92 -- -- 76 13 99 %  05/06/19 1610 (!) 145/95 (!) 97.5 F (36.4 C) Oral 70 17 100 %   Exam- A&O x3 , NAD in wheel chair, holding her son Lungs CTA bilat CV RRR Abdo soft, non acute.  Extr no C/C/ E or  tenderness   CBC Latest Ref Rng & Units 05/07/2019 05/06/2019 05/05/2019  WBC 4.0 - 10.5 K/uL 13.8(H) 8.4 10.4  Hemoglobin 12.0 - 15.0 g/dL 9.7(L) 10.8(L) 11.5(L)  Hematocrit 36.0 - 46.0 % 29.6(L) 32.8(L) 34.5(L)  Platelets 150 - 400 K/uL 180 197 197   CMP Latest Ref Rng & Units 05/07/2019 05/05/2019 04/23/2019  Glucose 70 - 99 mg/dL 126(H) 92 84  BUN 6 - 20 mg/dL 8 8 8   Creatinine 0.44 - 1.00 mg/dL 0.79 0.88 0.74  Sodium 135 - 145 mmol/L 131(L) 135 136  Potassium 3.5 - 5.1 mmol/L 4.6 3.9 3.7  Chloride 98 - 111 mmol/L 103 106 105  CO2 22 - 32 mmol/L 19(L) 18(L) 23  Calcium 8.9 - 10.3 mg/dL 8.3(L) 8.4(L) 8.8(L)  Total Protein 6.5 - 8.1 g/dL 5.0(L) 5.8(L) 5.7(L)  Total Bilirubin 0.3 - 1.2 mg/dL 0.1(L) 0.4 <0.1(L)  Alkaline Phos  38 - 126 U/L 69 91 68  AST 15 - 41 U/L 15 17 12(L)  ALT 0 - 44 U/L 17 21 18    A/P: 34 yo, G1P0101, Urgent Classical C/section at 33.1 wks for Breech, IUGR with AEDF in setting of worsening maternal HTN.  Post op stable, Labs nl. Warning s/s reviewed.  CHTN stable labs nl. Meds- Labetalol 400mg  TID and Hydralazine 10mg  TID. Plan to taper Hydralazine first as needed  DVT in 1st trim, IVF hormone related- on treatment doses but switched to Prophylactic 60mg  SQ recently since clot resolved, will cont PP for 12 weeks, 1st dose post-op today at 4 pm Hypothyroid - on Synthroid 32 PCOS- plans to breast feed, resume daily once Metformin GERD - Pantoprazole  Rh neg- rhogam work up PP  V.Oliana Gowens,MD

## 2019-05-07 NOTE — Lactation Note (Signed)
This note was copied from a baby's chart. Lactation Consultation Note  Patient Name: Sabrina White [id number] Date: 05/07/2019  Attempted to see mom several times in her room.  RN reports she has been in NICU.  Went to NICU to see mom.  Mom has initiated pumping with DEBP and reports she has a [employer] DEBP for home use.  Mom reports not getting anything yet with pumping but did get a few drops with hand expression which was encouraging. Mom with hx PCOS, hypothyroidism, hypertension, and acid reflux.  Baby Sabrina McCrary born via csection, and conceived IVF.  Mom reports no breast soreness or color changes, does feel they may have grown slightly third trimester. Mom reports pumping comfortable.  No pain or soreness with pumping Urged Mom to: 1. Try and pump with DEBP every 2 hours during day and 3 hours at night. 2.  Add massage and hand expression before and after pumping.  Watch [employer] on [employer]. 3.Discuss with Ob/Gyn and pediatrician possibiilty of switching reflux med to Reglan. 4.  Pump as much as able at baby's bedside to increase milk.  Parents to call lactation as needed.      Maternal Data    Feeding    LATCH Score                   Interventions    Lactation Tools Discussed/Used     Consult Status      Sabrina White 05/07/2019, 12:47 PM

## 2019-05-08 ENCOUNTER — Encounter (HOSPITAL_COMMUNITY): Payer: Self-pay | Admitting: Obstetrics & Gynecology

## 2019-05-08 MED ORDER — MAGNESIUM OXIDE 400 (241.3 MG) MG PO TABS
400.0000 mg | ORAL_TABLET | Freq: Two times a day (BID) | ORAL | Status: DC
  Administered 2019-05-08 – 2019-05-10 (×5): 400 mg via ORAL
  Filled 2019-05-08 (×5): qty 1

## 2019-05-08 MED ORDER — POLYSACCHARIDE IRON COMPLEX 150 MG PO CAPS
150.0000 mg | ORAL_CAPSULE | Freq: Every day | ORAL | Status: DC
  Administered 2019-05-08 – 2019-05-10 (×3): 150 mg via ORAL
  Filled 2019-05-08 (×3): qty 1

## 2019-05-08 NOTE — Progress Notes (Signed)
Postop day #2 status post classical cesarean section for worsening maternal blood pressures and growth restriction with absent end-diastolic flow at 33 weeks and 1 day.  Patient notes normal appetite, normal void, normal ambulation.  Patient has passed flatus and has had bowel movement.  Patient is pumping.  Patient denies headache, chest pain, shortness of breath, scotomata.  Patient hopeful to reduce antihypertensives to her labetalol which she was on prior to pregnancy.  Patient does note increase in pain at 4 AM this morning.  Good resolution of the pain and able to sleep after taking two Percocet.  Objective Vitals with BMI 05/08/2019 05/08/2019 05/07/2019  Height - - -  Weight - - -  BMI - - -  Systolic 157 151 921  Diastolic 95 93 75  Pulse 75 86 84   General: Well-appearing, no distress Abdomen: Obese, no right upper quadrant pain, fundus appropriately tender below the umbilicus Incision: Prevena dressing in place GU: Minimal lochia Lower extremity: Trace edema, 1+ DTR  CBC Latest Ref Rng & Units 05/07/2019 05/06/2019 05/05/2019  WBC 4.0 - 10.5 K/uL 13.8(H) 8.4 10.4  Hemoglobin 12.0 - 15.0 g/dL 1.9(E) 10.8(L) 11.5(L)  Hematocrit 36.0 - 46.0 % 29.6(L) 32.8(L) 34.5(L)  Platelets 150 - 400 K/uL 180 197 197   Assessment and plan: 34 year old G1 postop day #2 status post classical cesarean section at 33 weeks four growth restriction in and worsening chronic hypertension. -Hypertension.  Elevated blood pressures this a.m. while in pain.  Will check blood pressures every 2 hours through today for better evaluation.  Patient has been getting labetalol and hydralazine though will attempt labetalol along with next dose.  Low threshold to resume hydralazine.  No evidence of preeclampsia. -Anemia.  Started on iron and magnesium -Prematurity.  Baby doing well in NICU.  Mom pumping -History of PCOS.  Will continue patient on Metformin as she was on during the pregnancy.  -History of DVT.  Resolved  late in the pregnancy.  On prophylactic Lovenox 60 mg once daily we will continue this for 12 weeks  Lendon Colonel 05/08/2019 9:15 AM

## 2019-05-08 NOTE — Progress Notes (Signed)
Patient screened out for psychosocial assessment since none of the following apply:  Psychosocial stressors documented in mother or baby's chart  Gestation less than 32 weeks  Code at delivery   Infant with anomalies Please contact the Clinical Social Worker if specific needs arise, by MOB's request, or if MOB scores greater than 9/yes to question 10 on Edinburgh Postpartum Depression Screen.  Maryn Freelove, LCSW Clinical Social Worker [facility] Cell#: [phone]     

## 2019-05-08 NOTE — Lactation Note (Signed)
This note was copied from a baby's chart. Lactation Consultation Note  Patient Name: Sabrina White HTXHF'S Date: 05/08/2019 Reason for consult: Follow-up assessment  P1 mother whose infant is now 11 hours old.  This is a preterm infant at 33+3 weeks, weighing < 6 lbs and in the NICU.  Mother has a history of PCOS.  Mother has been "attempting" to pump every three hours.  Stressed the importance of pumping consistently every 2 1/2-3 hours to stimulate breasts and to encourage a good milk supply.  Mother is familiar with hand expression and has seen drops.  She called it "liquid gold."  Mother has started to see a few drops now when she pumps as well.  Encouraged hand expression before/after pumping to help milk production.  Mother comfortable with pumping and did not need any review of her pump or pump parts.  Encouraged to pump at baby's bedside in the NICU.  Mother seemed interested in this idea.  She has a DEBP for home use.  Reminded her that lactation services will be available to assist when baby becomes ready to latch and breast feed in the NICU.  Mother appreciative.   Maternal Data    Feeding Feeding Type: Donor Breast Milk  LATCH Score                   Interventions    Lactation Tools Discussed/Used     Consult Status Consult Status: Follow-up Date: 05/09/19 Follow-up type: In-patient    Sabrina White [name] 05/08/2019, 9:23 AM

## 2019-05-09 LAB — CBC
HCT: 31.6 % — ABNORMAL LOW (ref 36.0–46.0)
Hemoglobin: 10 g/dL — ABNORMAL LOW (ref 12.0–15.0)
MCH: 27.2 pg (ref 26.0–34.0)
MCHC: 31.6 g/dL (ref 30.0–36.0)
MCV: 86.1 fL (ref 80.0–100.0)
Platelets: 191 10*3/uL (ref 150–400)
RBC: 3.67 MIL/uL — ABNORMAL LOW (ref 3.87–5.11)
RDW: 14.3 % (ref 11.5–15.5)
WBC: 9.5 10*3/uL (ref 4.0–10.5)
nRBC: 0 % (ref 0.0–0.2)

## 2019-05-09 LAB — COMPREHENSIVE METABOLIC PANEL
ALT: 22 U/L (ref 0–44)
AST: 18 U/L (ref 15–41)
Albumin: 2.2 g/dL — ABNORMAL LOW (ref 3.5–5.0)
Alkaline Phosphatase: 60 U/L (ref 38–126)
Anion gap: 10 (ref 5–15)
BUN: 8 mg/dL (ref 6–20)
CO2: 26 mmol/L (ref 22–32)
Calcium: 8.6 mg/dL — ABNORMAL LOW (ref 8.9–10.3)
Chloride: 103 mmol/L (ref 98–111)
Creatinine, Ser: 0.95 mg/dL (ref 0.44–1.00)
GFR calc Af Amer: >60 mL/min (ref >60)
GFR calc non Af Amer: >60 mL/min (ref >60)
Glucose, Bld: 94 mg/dL (ref 70–99)
Potassium: 4.1 mmol/L (ref 3.5–5.1)
Sodium: 139 mmol/L (ref 135–145)
Total Bilirubin: 0.4 mg/dL (ref 0.3–1.2)
Total Protein: 5.3 g/dL — ABNORMAL LOW (ref 6.5–8.1)

## 2019-05-09 LAB — SURGICAL PATHOLOGY

## 2019-05-09 MED ORDER — HYDRALAZINE HCL 10 MG PO TABS
10.0000 mg | ORAL_TABLET | Freq: Three times a day (TID) | ORAL | Status: DC
  Administered 2019-05-09 – 2019-05-10 (×4): 10 mg via ORAL
  Filled 2019-05-09 (×4): qty 1

## 2019-05-09 MED ORDER — IBUPROFEN 600 MG PO TABS
600.0000 mg | ORAL_TABLET | Freq: Four times a day (QID) | ORAL | Status: DC
  Administered 2019-05-09 – 2019-05-10 (×5): 600 mg via ORAL
  Filled 2019-05-09 (×5): qty 1

## 2019-05-09 NOTE — Progress Notes (Signed)
POSTOPERATIVE DAY # 3 S/P Primary classical c/s for worsening hypertension, IUGR, absent diastolic flow, baby boy "McCrae" stable in NICU   S:         Reports feeling well, pain has improved today  Denies HA, visual changes, RUQ/epigastic pain             Tolerating po intake / no nausea / no vomiting / + flatus / + BM  Denies dizziness, SOB, or CP             Bleeding is light             Pain controlled withMotrin and Percocet             Up ad lib / ambulatory/ voiding QS without difficulty   Pumping colostrum, feels like milk supply is coming in    O:  VS: BP (!) 150/93   Pulse 76   Temp 98.3 F (36.8 C) (Oral)   Resp 17   Ht 5\' 5"  (1.651 m)   Wt 115.7 kg   LMP  (Exact Date)   SpO2 99%   Breastfeeding Unknown   BMI 42.43 kg/m   Patient Vitals for the past 24 hrs:  BP Temp Temp src Pulse Resp SpO2  05/09/19 0816 (!) 150/93 -- -- 76 -- --  05/09/19 0802 (!) 166/101 98.3 F (36.8 C) Oral 77 17 99 %  05/09/19 0308 (!) 148/99 98.1 F (36.7 C) Oral 83 17 100 %  05/08/19 2328 (!) 141/84 98.4 F (36.9 C) Oral 87 18 100 %  05/08/19 1930 (!) 146/87 98.5 F (36.9 C) Oral 85 20 100 %  05/08/19 1650 135/84 98.2 F (36.8 C) Oral 82 20 100 %  05/08/19 1455 129/85 -- -- 92 18 --  05/08/19 1300 (!) 142/86 98.2 F (36.8 C) Oral 76 18 99 %  05/08/19 1041 (!) 147/87 98.2 F (36.8 C) Oral 83 18 99 %    LABS:               Recent Labs    05/06/19 1106 05/07/19 0533  WBC 8.4 13.8*  HGB 10.8* 9.7*  PLT 197 180               Bloodtype: --/--/A NEG (03/12 1805)  Rubella: Immune (10/13 0000)                                             I&O: Intake/Output      03/15 0701 - 03/16 0700 03/16 0701 - 03/17 0700   P.O.     I.V. (mL/kg)     IV Piggyback     Total Intake(mL/kg)     Urine (mL/kg/hr)     Total Output     Net                       Physical Exam:             Alert and Oriented X3  Lungs: Clear and unlabored  Heart: regular rate and rhythm / no murmurs  Abdomen:  soft, non-tender, obese, mild gaseous distention, active bowel sounds             Fundus: firm, non-tender, U-3             Dressing: Prevena dressing in place, no drainage  Incision:  approximated with sutures / unable to visualize  Perineum: intact  Lochia: small rubra on pad  Extremities:  no calf pain or tenderness,+1 pedal and ankle edema  A/P:    POD # 3 S/P Primary Classical c/s            Chronic hypertension with exacerbation   - Labile BPs, isolated severe range this morning   - No neural s/s    - Current medication regimen: Labetalol 400mg  q8hrs   - Add Hydralazine 10mg  TID   - Repeat CBC and CMP now   ABL Anemia compounding chronic IDA   - on oral FE  Hx. Of DVT   - on Lovenox 60mg  SQ, and will plan to continue for 12 weeks PP  Hypothyroidism   - on Synthroid 79mcg daily   PCOS   - pumping, plans to breast feed on Metformin 500mg  QAM  GERD    - on Protonix   RH Negative   - baby is RH negative, no Rhogam indicated   Routine postoperative care              Shower today  Continue inpatient today  POC in consult with Dr. Christiana Pellant, MSN, CNM [facility]

## 2019-05-09 NOTE — Progress Notes (Signed)
Pt noted to have a rash on right armpit, right breast, and mid abdomen. Pt states it is very itchy, but denies scratchy throat or SOB. RN notified Carlean Jews, CNM about findings. Told to give PO benadryl for now and continue to monitor.

## 2019-05-10 ENCOUNTER — Other Ambulatory Visit (HOSPITAL_COMMUNITY): Admission: RE | Admit: 2019-05-10 | Payer: BC Managed Care – PPO | Source: Ambulatory Visit

## 2019-05-10 DIAGNOSIS — Z6791 Unspecified blood type, Rh negative: Secondary | ICD-10-CM

## 2019-05-10 DIAGNOSIS — Z86718 Personal history of other venous thrombosis and embolism: Secondary | ICD-10-CM

## 2019-05-10 DIAGNOSIS — E039 Hypothyroidism, unspecified: Secondary | ICD-10-CM | POA: Diagnosis present

## 2019-05-10 MED ORDER — METFORMIN HCL 500 MG PO TABS: 500.0000 mg | ORAL_TABLET | Freq: Every day | ORAL | 0 refills | Status: DC

## 2019-05-10 MED ORDER — COCONUT OIL OIL: 1.0000 "application " | TOPICAL_OIL | 0 refills | Status: DC | PRN

## 2019-05-10 MED ORDER — IBUPROFEN 600 MG PO TABS: 600.0000 mg | ORAL_TABLET | Freq: Four times a day (QID) | ORAL | 0 refills | Status: DC

## 2019-05-10 MED ORDER — LECITHIN 1200 MG PO CAPS: 2400.0000 mg | ORAL_CAPSULE | Freq: Every day | ORAL | 11 refills | Status: DC

## 2019-05-10 MED ORDER — DIPHENHYDRAMINE HCL 25 MG PO CAPS: 25.0000 mg | ORAL_CAPSULE | ORAL | 0 refills | Status: DC | PRN

## 2019-05-10 MED ORDER — SIMETHICONE 80 MG PO CHEW: 80.0000 mg | CHEWABLE_TABLET | ORAL | 0 refills | Status: DC | PRN

## 2019-05-10 MED ORDER — POLYSACCHARIDE IRON COMPLEX 150 MG PO CAPS: 150.0000 mg | ORAL_CAPSULE | Freq: Every day | ORAL | Status: DC

## 2019-05-10 MED ORDER — MAGNESIUM OXIDE 400 (241.3 MG) MG PO TABS: 400.0000 mg | ORAL_TABLET | Freq: Two times a day (BID) | ORAL | Status: DC

## 2019-05-10 MED ORDER — HYDRALAZINE HCL 10 MG PO TABS: 10.0000 mg | ORAL_TABLET | Freq: Three times a day (TID) | ORAL | 1 refills | Status: DC

## 2019-05-10 MED ORDER — DOCUSATE SODIUM 100 MG PO CAPS: 100.0000 mg | ORAL_CAPSULE | Freq: Every day | ORAL | 0 refills | Status: DC

## 2019-05-10 MED ORDER — OXYCODONE HCL 5 MG PO TABS: 5.0000 mg | ORAL_TABLET | Freq: Three times a day (TID) | ORAL | 0 refills | Status: DC | PRN

## 2019-05-10 NOTE — Discharge Instructions (Signed)
Cesarean Delivery, Care After This sheet gives you information about how to care for yourself after your procedure. Your health care provider may also give you more specific instructions. If you have problems or questions, contact your health care provider. What can I expect after the procedure? After the procedure, it is common to have:  A small amount of blood or clear fluid coming from the incision.  Some redness, swelling, and pain in your incision area.  Some abdominal pain and soreness.  Vaginal bleeding (lochia). Even though you did not have a vaginal delivery, you will still have vaginal bleeding and discharge.  Pelvic cramps.  Fatigue. You may have pain, swelling, and discomfort in the tissue between your vagina and your anus (perineum) if:  Your C-section was unplanned, and you were allowed to labor and push.  An incision was made in the area (episiotomy) or the tissue tore during attempted vaginal delivery. Follow these instructions at home: Incision care   Follow instructions from your health care provider about how to take care of your incision. Make sure you: ? Wash your hands with soap and water before you change your bandage (dressing). If soap and water are not available, use hand sanitizer. ? If you have a dressing, change it or remove it as told by your health care provider. ? Leave stitches (sutures), skin staples, skin glue, or adhesive strips in place. These skin closures may need to stay in place for 2 weeks or longer. If adhesive strip edges start to loosen and curl up, you may trim the loose edges. Do not remove adhesive strips completely unless your health care provider tells you to do that.  Check your incision area every day for signs of infection. Check for: ? More redness, swelling, or pain. ? More fluid or blood. ? Warmth. ? Pus or a bad smell.  Do not take baths, swim, or use a hot tub until your health care provider says it's okay. Ask your health  care provider if you can take showers.  When you cough or sneeze, hug a pillow. This helps with pain and decreases the chance of your incision opening up (dehiscing). Do this until your incision heals. Medicines  Take over-the-counter and prescription medicines only as told by your health care provider.  If you were prescribed an antibiotic medicine, take it as told by your health care provider. Do not stop taking the antibiotic even if you start to feel better.  Do not drive or use heavy machinery while taking prescription pain medicine. Lifestyle  Do not drink alcohol. This is especially important if you are breastfeeding or taking pain medicine.  Do not use any products that contain nicotine or tobacco, such as cigarettes, e-cigarettes, and chewing tobacco. If you need help quitting, ask your health care provider. Eating and drinking  Drink at least 8 eight-ounce glasses of water every day unless told not to by your health care provider. If you breastfeed, you may need to drink even more water.  Eat high-fiber foods every day. These foods may help prevent or relieve constipation. High-fiber foods include: ? Whole grain cereals and breads. ? Brown rice. ? Beans. ? Fresh fruits and vegetables. Activity   If possible, have someone help you care for your baby and help with household activities for at least a few days after you leave the hospital.  Return to your normal activities as told by your health care provider. Ask your health care provider what activities are safe for   you.  Rest as much as possible. Try to rest or take a nap while your baby is sleeping.  Do not lift anything that is heavier than 10 lbs (4.5 kg), or the limit that you were told, until your health care provider says that it is safe.  Talk with your health care provider about when you can engage in sexual activity. This may depend on your: ? Risk of infection. ? How fast you heal. ? Comfort and desire to  engage in sexual activity. General instructions  Do not use tampons or douches until your health care provider approves.  Wear loose, comfortable clothing and a supportive and well-fitting bra.  Keep your perineum clean and dry. Wipe from front to back when you use the toilet.  If you pass a blood clot, save it and call your health care provider to discuss. Do not flush blood clots down the toilet before you get instructions from your health care provider.  Keep all follow-up visits for you and your baby as told by your health care provider. This is important. Contact a health care provider if:  You have: ? A fever. ? Bad-smelling vaginal discharge. ? Pus or a bad smell coming from your incision. ? Difficulty or pain when urinating. ? A sudden increase or decrease in the frequency of your bowel movements. ? More redness, swelling, or pain around your incision. ? More fluid or blood coming from your incision. ? A rash. ? Nausea. ? Little or no interest in activities you used to enjoy. ? Questions about caring for yourself or your baby.  Your incision feels warm to the touch.  Your breasts turn red or become painful or hard.  You feel unusually sad or worried.  You vomit.  You pass a blood clot from your vagina.  You urinate more than usual.  You are dizzy or light-headed. Get help right away if:  You have: ? Pain that does not go away or get better with medicine. ? Chest pain. ? Difficulty breathing. ? Blurred vision or spots in your vision. ? Thoughts about hurting yourself or your baby. ? New pain in your abdomen or in one of your legs. ? A severe headache.  You faint.  You bleed from your vagina so much that you fill more than one sanitary pad in one hour. Bleeding should not be heavier than your heaviest period. Summary  After the procedure, it is common to have pain at your incision site, abdominal cramping, and slight bleeding from your vagina.  Check  your incision area every day for signs of infection.  Tell your health care provider about any unusual symptoms.  Keep all follow-up visits for you and your baby as told by your health care provider. This information is not intended to replace advice given to you by your health care provider. Make sure you discuss any questions you have with your health care provider. Document Revised: 08/18/2017 Document Reviewed: 08/18/2017 Elsevier Patient Education  2020 [employer] Preeclampsia and Eclampsia Preeclampsia is a serious condition that may develop during pregnancy. This condition causes high blood pressure and increased protein in your urine along with other symptoms, such as headaches and vision changes. These symptoms may develop as the condition gets worse. Preeclampsia may occur at 20 weeks of pregnancy or later. Diagnosing and treating preeclampsia early is very important. If not treated early, it can cause serious problems for you and your baby. One problem it can lead to is eclampsia. Eclampsia  is a condition that causes muscle jerking or shaking (convulsions or seizures) and other serious problems for the mother. During pregnancy, delivering your baby may be the best treatment for preeclampsia or eclampsia. For most women, preeclampsia and eclampsia symptoms go away after giving birth. In rare cases, a woman may develop preeclampsia after giving birth (postpartum preeclampsia). This usually occurs within 48 hours after childbirth but may occur up to 6 weeks after giving birth. What are the causes? The cause of preeclampsia is not known. What increases the risk? The following risk factors make you more likely to develop preeclampsia:  Being pregnant for the first time.  Having had preeclampsia during a past pregnancy.  Having a family history of preeclampsia.  Having high blood pressure.  Being pregnant with more than one baby.  Being 28 or older.  Being  African-American.  Having kidney disease or diabetes.  Having medical conditions such as lupus or blood diseases.  Being very overweight (obese). What are the signs or symptoms? The most common symptoms are:  Severe headaches.  Vision problems, such as blurred or double vision.  Abdominal pain, especially upper abdominal pain. Other symptoms that may develop as the condition gets worse include:  Sudden weight gain.  Sudden swelling of the hands, face, legs, and feet.  Severe nausea and vomiting.  Numbness in the face, arms, legs, and feet.  Dizziness.  Urinating less than usual.  Slurred speech.  Convulsions or seizures. How is this diagnosed? There are no screening tests for preeclampsia. Your health care provider will ask you about symptoms and check for signs of preeclampsia during your prenatal visits. You may also have tests that include:  Checking your blood pressure.  Urine tests to check for protein. Your health care provider will check for this at every prenatal visit.  Blood tests.  Monitoring your baby's heart rate.  Ultrasound. How is this treated? You and your health care provider will determine the treatment approach that is best for you. Treatment may include:  Having more frequent prenatal exams to check for signs of preeclampsia, if you have an increased risk for preeclampsia.  Medicine to lower your blood pressure.  Staying in the hospital, if your condition is severe. There, treatment will focus on controlling your blood pressure and the amount of fluids in your body (fluid retention).  Taking medicine (magnesium sulfate) to prevent seizures. This may be given as an injection or through an IV.  Taking a low-dose aspirin during your pregnancy.  Delivering your baby early. You may have your labor started with medicine (induced), or you may have a cesarean delivery. Follow these instructions at home: Eating and drinking   Drink enough  fluid to keep your urine pale yellow.  Avoid caffeine. Lifestyle  Do not use any products that contain nicotine or tobacco, such as cigarettes and e-cigarettes. If you need help quitting, ask your health care provider.  Do not use alcohol or drugs.  Avoid stress as much as possible. Rest and get plenty of sleep. General instructions  Take over-the-counter and prescription medicines only as told by your health care provider.  When lying down, lie on your left side. This keeps pressure off your major blood vessels.  When sitting or lying down, raise (elevate) your feet. Try putting some pillows underneath your lower legs.  Exercise regularly. Ask your health care provider what kinds of exercise are best for you.  Keep all follow-up and prenatal visits as told by your health care provider.  This is important. How is this prevented? There is no known way of preventing preeclampsia or eclampsia from developing. However, to lower your risk of complications and detect problems early:  Get regular prenatal care. Your health care provider may be able to diagnose and treat the condition early.  Maintain a healthy weight. Ask your health care provider for help managing weight gain during pregnancy.  Work with your health care provider to manage any long-term (chronic) health conditions you have, such as diabetes or kidney problems.  You may have tests of your blood pressure and kidney function after giving birth.  Your health care provider may have you take low-dose aspirin during your next pregnancy. Contact a health care provider if:  You have symptoms that your health care provider told you may require more treatment or monitoring, such as: ? Headaches. ? Nausea or vomiting. ? Abdominal pain. ? Dizziness. ? Light-headedness. Get help right away if:  You have severe: ? Abdominal pain. ? Headaches that do not get better. ? Dizziness. ? Vision problems. ? Confusion. ? Nausea or  vomiting.  You have any of the following: ? A seizure. ? Sudden, rapid weight gain. ? Sudden swelling in your hands, ankles, or face. ? Trouble moving any part of your body. ? Numbness in any part of your body. ? Trouble speaking. ? Abnormal bleeding.  You faint. Summary  Preeclampsia is a serious condition that may develop during pregnancy.  This condition causes high blood pressure and increased protein in your urine along with other symptoms, such as headaches and vision changes.  Diagnosing and treating preeclampsia early is very important. If not treated early, it can cause serious problems for you and your baby.  Get help right away if you have symptoms that your health care provider told you to watch for. This information is not intended to replace advice given to you by your health care provider. Make sure you discuss any questions you have with your health care provider. Document Revised: 10/12/2017 Document Reviewed: 09/16/2015 Elsevier Patient Education  2020 [employer].

## 2019-05-10 NOTE — Discharge Summary (Signed)
OB Discharge Summary     Patient Name: Sabrina White DOB: Jun 30, 1985 MRN: [id number]  Date of admission: 05/05/2019 Delivering MD: Noland Fordyce   Date of discharge: 05/10/2019  Admitting diagnosis: IUGR (intrauterine growth restriction) affecting care of mother, third trimester, not applicable or unspecified fetus [O36.5930] Delivery by emergency cesarean [O99.892] Intrauterine pregnancy: [redacted]w[redacted]d     Secondary diagnosis:  Principal Problem:   Postpartum care following cesarean delivery 3/13 Active Problems:   Chronic hypertension affecting pregnancy   IUGR (intrauterine growth restriction) 33 wks   Delivery by classical cesarean section   Delivery by emergency cesarean   Rh negative, maternal / newborn Rh neg   History of maternal deep vein thrombosis (DVT)   Hypothyroidism  Additional problems: none     Discharge diagnosis: Preterm Pregnancy Delivered                                                                                                Post partum procedures: Mag Sulfate therapy  Complications: None  Hospital course:  Sceduled C/S   34 y.o. yo G1P0101 at [redacted]w[redacted]d was admitted to the hospital 05/05/2019 for scheduled cesarean section with the following indication: Chronic hypertension with worsening maternal BP, IUGR and absent diastolic flow on umbilical Dopplers.  Membrane Rupture Time/Date: 3:03 PM ,05/06/2019   Patient delivered a Viable infant.05/06/2019  Details of operation can be found in separate operative note.  Patient postpartum course was complicated by severe range blood pressure for which she was adjusted on oral antihypertensives, Lovenox anticoagulation therapy for history of DVT,  and continued on metformin for impaired glucose metabolism due to PCOS. She is ambulating, tolerating a regular diet, passing flatus, and urinating well. Patient is discharged home in stable condition on  05/10/19, for close follow up in office on 05/15/2019. Preeclampsia  precautions reviewed.          Physical exam  Vitals:   05/09/19 2327 05/10/19 0003 05/10/19 0531 05/10/19 0802  BP: 124/72 127/75 (!) 146/85 (!) 151/90  Pulse: 82 80 80 80  Resp: 18  17   Temp: 98.4 F (36.9 C)  98.2 F (36.8 C) 98.2 F (36.8 C)  TempSrc: Oral  Oral Oral  SpO2: 100%  99% 98%  Weight:      Height:       See progress note for exam.  Labs: Lab Results  Component Value Date   WBC 9.5 05/09/2019   HGB 10.0 (L) 05/09/2019   HCT 31.6 (L) 05/09/2019   MCV 86.1 05/09/2019   PLT 191 05/09/2019   CMP Latest Ref Rng & Units 05/09/2019  Glucose 70 - 99 mg/dL 94  BUN 6 - 20 mg/dL 8  Creatinine 4.26 - 8.34 mg/dL 1.96  Sodium 222 - 979 mmol/L 139  Potassium 3.5 - 5.1 mmol/L 4.1  Chloride 98 - 111 mmol/L 103  CO2 22 - 32 mmol/L 26  Calcium 8.9 - 10.3 mg/dL 8.9(Q)  Total Protein 6.5 - 8.1 g/dL 5.3(L)  Total Bilirubin 0.3 - 1.2 mg/dL 0.4  Alkaline Phos 38 - 126 U/L 60  AST 15 -  41 U/L 18  ALT 0 - 44 U/L 22   Edinburgh Postnatal Depression Scale Screening Tool 05/07/2019  I have been able to laugh and see the funny side of things. 0  I have looked forward with enjoyment to things. 0  I have blamed myself unnecessarily when things went wrong. 2  I have been anxious or worried for no good reason. 2  I have felt scared or panicky for no good reason. 2  Things have been getting on top of me. 1  I have been so unhappy that I have had difficulty sleeping. 1  I have felt sad or miserable. 1  I have been so unhappy that I have been crying. 0  The thought of harming myself has occurred to me. 0  Edinburgh Postnatal Depression Scale Total 9    Discharge instruction: per After Visit Summary and "Baby and Me Booklet".  After visit meds:  Allergies as of 05/10/2019      Reactions   Codeine Other (See Comments)   Other reaction(s): Dizziness   Nifedipine Nausea And Vomiting, Other (See Comments)   Caused severe headache.   Raspberry Other (See Comments)   Causes  blisters on lips      Medication List    STOP taking these medications   Doxylamine-Pyridoxine 10-10 MG Tbec     TAKE these medications   acetaminophen 500 MG tablet Commonly known as: TYLENOL Take 500 mg by mouth every 6 (six) hours as needed for headache.   aspirin EC 81 MG tablet Take 81 mg by mouth daily.   coconut oil Oil Apply 1 application topically as needed.   diphenhydrAMINE 25 mg capsule Commonly known as: BENADRYL Take 1 capsule (25 mg total) by mouth every 4 (four) hours as needed (refractory itching).   docusate sodium 100 MG capsule Commonly known as: COLACE Take 1 capsule (100 mg total) by mouth daily. Start taking on: May 11, 2019   enoxaparin 60 MG/0.6ML injection Commonly known as: LOVENOX Inject 60 mg into the skin daily.   hydrALAZINE 10 MG tablet Commonly known as: APRESOLINE Take 1 tablet (10 mg total) by mouth 3 (three) times daily. What changed:   medication strength  how much to take  when to take this   ibuprofen 600 MG tablet Commonly known as: ADVIL Take 1 tablet (600 mg total) by mouth every 6 (six) hours.   iron polysaccharides 150 MG capsule Commonly known as: Ferrex 150 Take 1 capsule (150 mg total) by mouth daily.   labetalol 200 MG tablet Commonly known as: NORMODYNE Take 2 tablets (400 mg total) by mouth every 8 (eight) hours.   Lecithin 1200 MG Caps Take 2 capsules (2,400 mg total) by mouth daily.   levothyroxine 25 MCG tablet Commonly known as: SYNTHROID Take 25 mcg by mouth daily.   magnesium oxide 400 (241.3 Mg) MG tablet Commonly known as: MAG-OX Take 1 tablet (400 mg total) by mouth 2 (two) times daily.   metFORMIN 500 MG tablet Commonly known as: GLUCOPHAGE Take 1 tablet (500 mg total) by mouth daily with breakfast. Start taking on: May 11, 2019 What changed: when to take this   omeprazole 20 MG capsule Commonly known as: PRILOSEC Take 1 capsule (20 mg total) by mouth 2 (two) times a day.    oxyCODONE 5 MG immediate release tablet Commonly known as: Roxicodone Take 1 tablet (5 mg total) by mouth every 8 (eight) hours as needed.   Pepcid AC Maximum Strength 20 MG  Chew Generic drug: Famotidine Chew 1 tablet by mouth daily as needed (For heartburn.).   prenatal multivitamin Tabs tablet Take 1 tablet by mouth daily at 12 noon.   simethicone 80 MG chewable tablet Commonly known as: MYLICON Chew 1 tablet (80 mg total) by mouth as needed for flatulence.       Diet: routine diet  Activity: Advance as tolerated. Pelvic rest for 6 weeks.  . Follow-up Information    Noland Fordyce, MD. Schedule an appointment as soon as possible for a visit on 05/15/2019.   Specialty: Obstetrics and Gynecology Why: BP and wound check Contact information: [address] [place] [address] [phone]          Postpartum contraception: Not Discussed  Newborn Data: Live born female Blenda Bridegroom Birth Weight: 3 lb 5.3 oz (1510 g) APGAR: 2, 8  Newborn Delivery   Birth date/time: 05/06/2019 15:05:00 Delivery type: C-Section, Low Vertical Trial of labor: No C-section categorization: Primary      Baby Feeding: pumping for breastmilk Disposition:NICU   05/10/2019 Neta Mends, CNM

## 2019-05-10 NOTE — Progress Notes (Signed)
Discharge instructions and prescriptions given to pt. Discussed post-op c-section care, signs and symptoms to report to the MD, upcoming appointments, and meds. Pt verbalizes understanding and has no questions or concerns at this time. Pt discharged from hospital in stable condition.

## 2019-05-10 NOTE — Progress Notes (Signed)
Patient ID: Sabrina White, female   DOB: 15-Dec-1985, 34 y.o.   MRN: [id number] Subjective: POD# 4 Live born female  Birth Weight: 3 lb 5.3 oz (1510 g) APGAR: 2, 8  Newborn Delivery   Birth date/time: 05/06/2019 15:05:00 Delivery type: C-Section, Low Vertical Trial of labor: No C-section categorization: Primary     Baby name: Blenda Bridegroom NICU admit for prematurity, stable Delivering provider: Noland Fordyce   circumcision undecided Feeding: pumping   Pain control at delivery: Spinal   Reports feeling well, desires DC home today  Patient reports tolerating PO.   Breast symptoms:+ colostrum, pumping Pain controlled with PO meds Denies HA/SOB/C/P/N/V/dizziness. Flatus present. She reports vaginal bleeding as normal, without clots.  She is ambulating, urinating without difficulty.     Objective:   VS:    Vitals:   05/09/19 2327 05/10/19 0003 05/10/19 0531 05/10/19 0802  BP: 124/72 127/75 (!) 146/85 (!) 151/90  Pulse: 82 80 80 80  Resp: 18  17   Temp: 98.4 F (36.9 C)  98.2 F (36.8 C) 98.2 F (36.8 C)  TempSrc: Oral  Oral Oral  SpO2: 100%  99% 98%  Weight:      Height:        No intake or output data in the 24 hours ending 05/10/19 0852      Recent Labs    05/09/19 1013  WBC 9.5  HGB 10.0*  HCT 31.6*  PLT 191     Blood type: --/--/A NEG (03/12 1805)  Rubella: Immune (10/13 0000)  Vaccines: TDaP UTD         Flu    UTD   Physical Exam:  General: alert, cooperative and no distress Abdomen: soft, nontender, normal bowel sounds Incision: clean, dry, intact and Prevena dressing intact Uterine Fundus: firm, below umbilicus, nontender Lochia: minimal Ext: no edema, redness or tenderness in the calves or thighs      Assessment/Plan: 34 y.o.   POD# 4. [id number]                  Principal Problem:   Postpartum care following cesarean delivery 3/13 Active Problems:   Chronic hypertension affecting pregnancy  - yesterday restarted on hydralazine 10 mg TID,  continued labetalol 400 mg TID for severe range BP, overnight and this am labile BP to mild range.   - LFT's and SrC normal with slight increases yesterday  - denies neural sx, diuresing well   IUGR (intrauterine growth restriction) 33 wks   Delivery by classical cesarean section   Delivery by emergency cesarean   ABL Anemia compounding chronic IDA                         - on oral FE    Hx. Of DVT                         - on Lovenox 60mg  SQ, and will plan to continue for 12 weeks PP    Hypothyroidism                         - on Synthroid daily      PCOS                         - pumping, plans to breast feed on Metformin 500mg  QAM      GERD                          -  on Protonix        RH Negative                         - baby is RH negative, no Rhogam indicated   Doing well, stable.                DC home today w/ instructions  F/U at [place] office 05/15/2019 and PRN   POC in consult w/ Dr. Penelope Coop, CNM, MSN 05/10/2019, 8:52 AM

## 2019-05-12 ENCOUNTER — Encounter (HOSPITAL_COMMUNITY): Payer: Self-pay

## 2019-05-12 ENCOUNTER — Inpatient Hospital Stay (HOSPITAL_COMMUNITY): Admit: 2019-05-12 | Payer: BC Managed Care – PPO | Admitting: Obstetrics

## 2019-05-12 SURGERY — Surgical Case
Anesthesia: Spinal

## 2019-06-10 ENCOUNTER — Telehealth (HOSPITAL_COMMUNITY): Payer: Self-pay

## 2019-06-10 NOTE — Telephone Encounter (Signed)
Mother phoned office due to a knot she found on the lower half of her right breast. She reports that she began to massage the knot and she feels that is it now going away. . Mother denies that area is red or warm to touch.  Mother denies fever or aches .  Reports that she looked in the mirror and her breast looked the same on both sides.,  Mother reports that her rt nipple is red as well. She thinks that she may have let the flange side slip while using a hands free bra. Mother receptive to all teaching and very appreciative. Advised to phone MD if any disscussed symptoms arrise.

## 2019-07-25 ENCOUNTER — Other Ambulatory Visit: Payer: Self-pay

## 2019-07-26 ENCOUNTER — Encounter: Payer: Self-pay | Admitting: Family Medicine

## 2019-07-26 ENCOUNTER — Ambulatory Visit: Payer: BC Managed Care – PPO | Admitting: Family Medicine

## 2019-07-26 ENCOUNTER — Other Ambulatory Visit: Payer: Self-pay

## 2019-07-26 VITALS — BP 110/72 | HR 75 | Temp 97.3°F | Resp 15 | Wt 245.8 lb

## 2019-07-26 DIAGNOSIS — D500 Iron deficiency anemia secondary to blood loss (chronic): Secondary | ICD-10-CM

## 2019-07-26 DIAGNOSIS — I10 Essential (primary) hypertension: Secondary | ICD-10-CM

## 2019-07-26 DIAGNOSIS — Z86718 Personal history of other venous thrombosis and embolism: Secondary | ICD-10-CM | POA: Diagnosis not present

## 2019-07-26 DIAGNOSIS — M791 Myalgia, unspecified site: Secondary | ICD-10-CM

## 2019-07-26 DIAGNOSIS — F410 Panic disorder [episodic paroxysmal anxiety] without agoraphobia: Secondary | ICD-10-CM

## 2019-07-26 DIAGNOSIS — Z8759 Personal history of other complications of pregnancy, childbirth and the puerperium: Secondary | ICD-10-CM

## 2019-07-26 DIAGNOSIS — Z98891 History of uterine scar from previous surgery: Secondary | ICD-10-CM

## 2019-07-26 DIAGNOSIS — E039 Hypothyroidism, unspecified: Secondary | ICD-10-CM

## 2019-07-26 DIAGNOSIS — E038 Other specified hypothyroidism: Secondary | ICD-10-CM

## 2019-07-26 DIAGNOSIS — E282 Polycystic ovarian syndrome: Secondary | ICD-10-CM

## 2019-07-26 DIAGNOSIS — M2550 Pain in unspecified joint: Secondary | ICD-10-CM

## 2019-07-26 MED ORDER — AMLODIPINE BESYLATE 10 MG PO TABS: 10.0000 mg | ORAL_TABLET | Freq: Every day | ORAL | 3 refills | Status: DC

## 2019-07-26 MED ORDER — HYDRALAZINE HCL 10 MG PO TABS: 10.0000 mg | ORAL_TABLET | Freq: Every day | ORAL | 1 refills | Status: DC | PRN

## 2019-07-26 MED ORDER — ALPRAZOLAM 0.5 MG PO TABS: 0.5000 mg | ORAL_TABLET | Freq: Every day | ORAL | 0 refills | Status: DC | PRN

## 2019-07-26 MED ORDER — HYDROCHLOROTHIAZIDE 25 MG PO TABS: 25.0000 mg | ORAL_TABLET | Freq: Every day | ORAL | 3 refills | Status: DC

## 2019-07-26 NOTE — Progress Notes (Signed)
Subjective  CC:  Chief Complaint  Patient presents with  . Hypertension    switched to Labetalol while going through IVF and pregnancy, wanting to start another medication now that she is no longer pregnant  . Anxiety    increased after childbirth, states that her anxiety attacks do not include crying or panicing - more physical - chest pain   . Joint Pain    unsure if this is due to breastfeeding/pumping    HPI: Sabrina White is a 34 y.o. female who presents to the office today to address the problems listed above in the chief complaint.  Hypertension f/u: Control is good . Pt reports she is doing well. However on bid labetalol and tid hydralazine. Wanting to get back to easier regimen since no longer pregnant and not planning for any future pregnancies. She is breast feeding though.  Compliance with medication is good. She had been well controlled prior to pregnancy on lisinopril 20 and hctz 25.   Complicated HR pregnancy: IUGR emergent classical c-section due to infant distress. Premature delivery at 33 weeks. H/o maternal DVT. Had a  4 week NICU stay as well. Very stressful and now c/o classic panic attacks: 8 in the last 3 months. But no known triggers. Feels fine and the sudden heaviness in chest with hard palpitations, sob, paresthesias. Has done deep breathing or meditation to get through them. No h/o panic nor anxiety. Otherwise feels well. Life now is "easier" since being home with newborn. No sxs of postpartum depression.   C/o severe muscle and joint pain since delivery. Almost daily. Limiting activity. No red hot or swollen joints. No weakness. No rashes. Unrelated to activity and it is diffuse.   Assessment  1. Essential hypertension   2. History of maternal deep vein thrombosis (DVT)   3. Panic attacks   4. PCOS (polycystic ovarian syndrome)   5. Subclinical hypothyroidism   6. Myalgia   7. Arthralgia, unspecified joint   8. Iron deficiency anemia due to chronic  blood loss      Plan    Hypertension f/u: change to amlodipine and hctz - avoid ace while breast feeding. Had headaches with nifedipine so will see if tolerates amlodipine. Has home bp cuff and she will monitor. Can use hydralazine as prn if gets too high. stop labetalol. Can change to toprol xl if doesn't tolerate amlodipine.   Panic attacks: ? Related to stressors related to preg/premature delivery. No mood d/o. Xanax and behavioral mgt and monitor. F/u if worsens.   H/o subclinical hypothyroidism on low dose supplement for infertility issues. Due for recheck.   Myalgias and arthralgias: no joint abnormalities on exam. Check labs. ? Related to medication or other.   F/u on IDA. Did take 4-6 week course of iron postpartum  Close f/u Education regarding management of these chronic disease states was given. Management strategies discussed on successive visits include dietary and exercise recommendations, goals of achieving and maintaining IBW, and lifestyle modifications aiming for adequate sleep and minimizing stressors.   Follow up: 3-4 weeks for recheck.   Orders Placed This Encounter  Procedures  . VITAMIN D 25 Hydroxy (Vit-D Deficiency, Fractures)  . TSH  . T4, free  . T3  . Sedimentation rate  . Iron, TIBC and Ferritin Panel  . Comprehensive metabolic panel  . CK (Creatine Kinase)  . CBC with Differential/Platelet  . Antinuclear Antib (ANA)   Meds ordered this encounter  Medications  . hydrALAZINE (APRESOLINE) 10 MG  tablet    Sig: Take 1 tablet (10 mg total) by mouth daily as needed (BP > 180/110).    Dispense:  90 tablet    Refill:  1  . ALPRAZolam (XANAX) 0.5 MG tablet    Sig: Take 1 tablet (0.5 mg total) by mouth daily as needed (panic attack).    Dispense:  12 tablet    Refill:  0  . amLODipine (NORVASC) 10 MG tablet    Sig: Take 1 tablet (10 mg total) by mouth daily.    Dispense:  90 tablet    Refill:  3  . hydrochlorothiazide (HYDRODIURIL) 25 MG tablet     Sig: Take 1 tablet (25 mg total) by mouth daily.    Dispense:  90 tablet    Refill:  3      BP Readings from Last 3 Encounters:  07/26/19 110/72  05/10/19 (!) 151/90  05/05/19 (!) 154/105   Wt Readings from Last 3 Encounters:  07/26/19 245 lb 12.8 oz (111.5 kg)  05/05/19 255 lb (115.7 kg)  04/20/19 259 lb (117.5 kg)    Lab Results  Component Value Date   CHOL 185 04/05/2017   Lab Results  Component Value Date   HDL 31.50 (L) 04/05/2017   No results found for: [facility] Lab Results  Component Value Date   TRIG 211.0 (H) 04/05/2017   Lab Results  Component Value Date   CHOLHDL 6 04/05/2017   Lab Results  Component Value Date   LDLDIRECT 117.0 04/05/2017   Lab Results  Component Value Date   CREATININE 0.95 05/09/2019   BUN 8 05/09/2019   NA 139 05/09/2019   K 4.1 05/09/2019   CL 103 05/09/2019   CO2 26 05/09/2019    The ASCVD Risk score (Goff DC Jr., et al., 2013) failed to calculate for the following reasons:   The 2013 ASCVD risk score is only valid for ages 50 to 50  I reviewed the patients updated PMH, FH, and SocHx.    Patient Active Problem List   Diagnosis Date Noted  . History of maternal deep vein thrombosis (DVT) 05/10/2019  . Hypothyroidism 05/10/2019  . 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 Family history of breast cancer in mother 04/05/2017  . Morbid obesity due to excess calories (HCC) 06/24/2015  . Benign hypertension 04/01/2015  . Gastroesophageal reflux disease without esophagitis 04/01/2015  . PCOS (polycystic ovarian syndrome) 04/01/2015    Allergies: Codeine, Nifedipine, and Raspberry  Social History: Patient  reports that she has never smoked. She has never used smokeless tobacco. She reports current alcohol use. She reports that she does not use drugs.  Current Meds  Medication Sig  . acetaminophen (TYLENOL) 500 MG tablet Take 500 mg by mouth every 6 (six) hours as needed for headache.  Marland Kitchen aspirin EC 81 MG tablet Take 81 mg by  mouth daily.  . coconut oil OIL Apply 1 application topically as needed.  . diphenhydrAMINE (BENADRYL) 25 mg capsule Take 1 capsule (25 mg total) by mouth every 4 (four) hours as needed (refractory itching).  . enoxaparin (LOVENOX) 60 MG/0.6ML injection Inject 60 mg into the skin daily.  . hydrALAZINE (APRESOLINE) 10 MG tablet Take 1 tablet (10 mg total) by mouth daily as needed (BP > 180/110).  . metFORMIN (GLUCOPHAGE) 500 MG tablet Take 1 tablet (500 mg total) by mouth daily with breakfast.  . omeprazole (PRILOSEC) 20 MG capsule Take 1 capsule (20 mg total) by mouth 2 (two) times a day.  . [  DISCONTINUED] hydrALAZINE (APRESOLINE) 10 MG tablet Take 1 tablet (10 mg total) by mouth 3 (three) times daily. (Patient taking differently: Take 10 mg by mouth in the morning and at bedtime. )  . [DISCONTINUED] labetalol (NORMODYNE) 200 MG tablet Take 2 tablets (400 mg total) by mouth every 8 (eight) hours.    Review of Systems: Cardiovascular: negative for chest pain, palpitations, leg swelling, orthopnea Respiratory: negative for SOB, wheezing or persistent cough Gastrointestinal: negative for abdominal pain Genitourinary: negative for dysuria or gross hematuria  Objective  Vitals: BP 110/72   Pulse 75   Temp (!) 97.3 F (36.3 C) (Temporal)   Resp 15   Wt 245 lb 12.8 oz (111.5 kg)   LMP  (Exact Date)   SpO2 98%   BMI 40.90 kg/m  General: no acute distress  Psych:  Alert and oriented, normal mood and affect HEENT:  Normocephalic, atraumatic, supple neck  Cardiovascular:  RRR without murmur. no edema Respiratory:  Good breath sounds bilaterally, CTAB with normal respiratory effort Skin:  Warm, no rashes Neurologic:   Mental status is normal Nl joints. No mm ttp  Commons side effects, risks, benefits, and alternatives for medications and treatment plan prescribed today were discussed, and the patient expressed understanding of the given instructions. Patient is instructed to call or message  via MyChart if he/she has any questions or concerns regarding our treatment plan. No barriers to understanding were identified. We discussed Red Flag symptoms and signs in detail. Patient expressed understanding regarding what to do in case of urgent or emergency type symptoms.   Medication list was reconciled, printed and provided to the patient in AVS. Patient instructions and summary information was reviewed with the patient as documented in the AVS. This note was prepared with assistance of Dragon voice recognition software. Occasional wrong-word or sound-a-like substitutions may have occurred due to the inherent limitations of voice recognition software  This visit occurred during the SARS-CoV-2 public health emergency.  Safety protocols were in place, including screening questions prior to the visit, additional usage of staff PPE, and extensive cleaning of exam room while observing appropriate contact time as indicated for disinfecting solutions.

## 2019-07-27 ENCOUNTER — Other Ambulatory Visit: Payer: Self-pay

## 2019-07-27 ENCOUNTER — Other Ambulatory Visit (INDEPENDENT_AMBULATORY_CARE_PROVIDER_SITE_OTHER): Payer: BC Managed Care – PPO

## 2019-07-27 ENCOUNTER — Encounter: Payer: Self-pay | Admitting: Family Medicine

## 2019-07-27 DIAGNOSIS — D500 Iron deficiency anemia secondary to blood loss (chronic): Secondary | ICD-10-CM | POA: Diagnosis not present

## 2019-07-27 DIAGNOSIS — M791 Myalgia, unspecified site: Secondary | ICD-10-CM | POA: Diagnosis not present

## 2019-07-27 DIAGNOSIS — E038 Other specified hypothyroidism: Secondary | ICD-10-CM

## 2019-07-27 DIAGNOSIS — M2550 Pain in unspecified joint: Secondary | ICD-10-CM | POA: Diagnosis not present

## 2019-07-27 DIAGNOSIS — I10 Essential (primary) hypertension: Secondary | ICD-10-CM

## 2019-07-27 DIAGNOSIS — E039 Hypothyroidism, unspecified: Secondary | ICD-10-CM | POA: Diagnosis not present

## 2019-07-27 DIAGNOSIS — Z98891 History of uterine scar from previous surgery: Secondary | ICD-10-CM | POA: Insufficient documentation

## 2019-07-27 LAB — POCT URINALYSIS DIPSTICK
Bilirubin, UA: NEGATIVE
Blood, UA: NEGATIVE
Glucose, UA: NEGATIVE
Ketones, UA: NEGATIVE
Leukocytes, UA: NEGATIVE
Nitrite, UA: NEGATIVE
Protein, UA: NEGATIVE
Spec Grav, UA: 1.015 (ref 1.010–1.025)
Urobilinogen, UA: 0.2 E.U./dL
pH, UA: 6 (ref 5.0–8.0)

## 2019-07-27 LAB — CBC WITH DIFFERENTIAL/PLATELET
Basophils Absolute: 0.1 10*3/uL (ref 0.0–0.1)
Basophils Relative: 0.9 % (ref 0.0–3.0)
Eosinophils Absolute: 0.2 10*3/uL (ref 0.0–0.7)
Eosinophils Relative: 3.4 % (ref 0.0–5.0)
HCT: 34.6 % — ABNORMAL LOW (ref 36.0–46.0)
Hemoglobin: 11.2 g/dL — ABNORMAL LOW (ref 12.0–15.0)
Lymphocytes Relative: 35.2 % (ref 12.0–46.0)
Lymphs Abs: 2.2 10*3/uL (ref 0.7–4.0)
MCHC: 32.5 g/dL (ref 30.0–36.0)
MCV: 77.4 fl — ABNORMAL LOW (ref 78.0–100.0)
Monocytes Absolute: 0.3 10*3/uL (ref 0.1–1.0)
Monocytes Relative: 4.7 % (ref 3.0–12.0)
Neutro Abs: 3.5 10*3/uL (ref 1.4–7.7)
Neutrophils Relative %: 55.8 % (ref 43.0–77.0)
Platelets: 231 10*3/uL (ref 150.0–400.0)
RBC: 4.47 Mil/uL (ref 3.87–5.11)
RDW: 14.3 % (ref 11.5–15.5)
WBC: 6.3 10*3/uL (ref 4.0–10.5)

## 2019-07-27 LAB — COMPREHENSIVE METABOLIC PANEL
ALT: 26 U/L (ref 0–35)
AST: 17 U/L (ref 0–37)
Albumin: 4 g/dL (ref 3.5–5.2)
Alkaline Phosphatase: 58 U/L (ref 39–117)
BUN: 10 mg/dL (ref 6–23)
CO2: 28 mEq/L (ref 19–32)
Calcium: 8.8 mg/dL (ref 8.4–10.5)
Chloride: 103 mEq/L (ref 96–112)
Creatinine, Ser: 0.78 mg/dL (ref 0.40–1.20)
GFR: 84.59 mL/min (ref >60.00)
Glucose, Bld: 94 mg/dL (ref 70–99)
Potassium: 4 mEq/L (ref 3.5–5.1)
Sodium: 138 mEq/L (ref 135–145)
Total Bilirubin: 0.3 mg/dL (ref 0.2–1.2)
Total Protein: 6.3 g/dL (ref 6.0–8.3)

## 2019-07-27 LAB — CK: Total CK: 127 U/L (ref 7–177)

## 2019-07-27 LAB — VITAMIN D 25 HYDROXY (VIT D DEFICIENCY, FRACTURES): VITD: 23.01 ng/mL — ABNORMAL LOW (ref 30.00–100.00)

## 2019-07-27 LAB — TSH: TSH: 2.53 u[IU]/mL (ref 0.35–4.50)

## 2019-07-27 LAB — SEDIMENTATION RATE: Sed Rate: 26 mm/hr — ABNORMAL HIGH (ref 0–20)

## 2019-07-27 LAB — T4, FREE: Free T4: 0.77 ng/dL (ref 0.60–1.60)

## 2019-07-31 ENCOUNTER — Other Ambulatory Visit: Payer: Self-pay

## 2019-07-31 DIAGNOSIS — M2550 Pain in unspecified joint: Secondary | ICD-10-CM

## 2019-07-31 DIAGNOSIS — R768 Other specified abnormal immunological findings in serum: Secondary | ICD-10-CM

## 2019-07-31 LAB — IRON,TIBC AND FERRITIN PANEL
%SAT: 15 % (calc) — ABNORMAL LOW (ref 16–45)
Ferritin: 4 ng/mL — ABNORMAL LOW (ref 16–154)
Iron: 52 ug/dL (ref 40–190)
TIBC: 354 mcg/dL (calc) (ref 250–450)

## 2019-07-31 LAB — ANTI-NUCLEAR AB-TITER (ANA TITER): ANA Titer 1: 1:80 {titer} — ABNORMAL HIGH

## 2019-07-31 LAB — T3: T3, Total: 108 ng/dL (ref 76–181)

## 2019-07-31 LAB — ANA: Anti Nuclear Antibody (ANA): POSITIVE — AB

## 2019-08-21 ENCOUNTER — Other Ambulatory Visit: Payer: Self-pay

## 2019-08-21 ENCOUNTER — Ambulatory Visit (INDEPENDENT_AMBULATORY_CARE_PROVIDER_SITE_OTHER): Payer: BC Managed Care – PPO | Admitting: Family Medicine

## 2019-08-21 ENCOUNTER — Encounter: Payer: Self-pay | Admitting: Family Medicine

## 2019-08-21 VITALS — BP 124/82 | HR 82 | Temp 98.1°F | Ht 65.0 in | Wt 242.0 lb

## 2019-08-21 DIAGNOSIS — I10 Essential (primary) hypertension: Secondary | ICD-10-CM

## 2019-08-21 DIAGNOSIS — D500 Iron deficiency anemia secondary to blood loss (chronic): Secondary | ICD-10-CM

## 2019-08-21 DIAGNOSIS — M791 Myalgia, unspecified site: Secondary | ICD-10-CM | POA: Diagnosis not present

## 2019-08-21 DIAGNOSIS — F410 Panic disorder [episodic paroxysmal anxiety] without agoraphobia: Secondary | ICD-10-CM

## 2019-08-21 DIAGNOSIS — R768 Other specified abnormal immunological findings in serum: Secondary | ICD-10-CM

## 2019-08-21 MED ORDER — ALPRAZOLAM 0.5 MG PO TABS: 0.2500 mg | ORAL_TABLET | Freq: Two times a day (BID) | ORAL | 0 refills | Status: DC | PRN

## 2019-08-21 MED ORDER — SERTRALINE HCL 25 MG PO TABS: ORAL_TABLET | ORAL | 1 refills | Status: DC

## 2019-08-21 NOTE — Progress Notes (Signed)
Subjective  CC:  Chief Complaint  Patient presents with   Hypertension    doing well on meds home readings have been in range.     HPI: Sabrina White is a 34 y.o. female who presents to the office today to address the problems listed above in the chief complaint.  Hypertension f/u: Control is good . Pt reports she is doing well. taking medications as instructed, no medication side effects noted, no TIAs, no chest pain on exertion, no dyspnea on exertion, no swelling of ankles. Readings now normal and tolerating amlodipine and hctz. She denies adverse effects from his BP medications. Compliance with medication is good. To stop breast feeding next week.  Myalgias have improved. Has rheum eval in October.   IDA on supplements now.  Vit d def on supplements now  Panic is worse. More constant anxiety.  Assessment  1. Benign hypertension   2. Positive ANA (antinuclear antibody)   3. Myalgia   4. Iron deficiency anemia due to chronic blood loss   5. Panic attacks      Plan    Hypertension f/u: BP control is well controlled. Continue meds  IDA f/u: continue oral iron supp  Vit D: continue daily supp. May be helping myalgias  + ana: will see rheum if sxs persist  Panic and anxiety: post partum. Start zoloft and continue xanax. To start counseling with new therapist next week; specialized in maternal mental health.   Education regarding management of these chronic disease states was given. Management strategies discussed on successive visits include dietary and exercise recommendations, goals of achieving and maintaining IBW, and lifestyle modifications aiming for adequate sleep and minimizing stressors.   Follow up: 8 weeks to recheck mood  No orders of the defined types were placed in this encounter.  No orders of the defined types were placed in this encounter.     BP Readings from Last 3 Encounters:  08/21/19 124/82  07/26/19 110/72  05/10/19 (!) 151/90    Wt Readings from Last 3 Encounters:  08/21/19 242 lb (109.8 kg)  07/26/19 245 lb 12.8 oz (111.5 kg)  05/05/19 255 lb (115.7 kg)    Lab Results  Component Value Date   CHOL 185 04/05/2017   Lab Results  Component Value Date   HDL 31.50 (L) 04/05/2017   No results found for: [facility] Lab Results  Component Value Date   TRIG 211.0 (H) 04/05/2017   Lab Results  Component Value Date   CHOLHDL 6 04/05/2017   Lab Results  Component Value Date   LDLDIRECT 117.0 04/05/2017   Lab Results  Component Value Date   CREATININE 0.78 07/27/2019   BUN 10 07/27/2019   NA 138 07/27/2019   K 4.0 07/27/2019   CL 103 07/27/2019   CO2 28 07/27/2019    The ASCVD Risk score (Goff DC Jr., et al., 2013) failed to calculate for the following reasons:   The 2013 ASCVD risk score is only valid for ages 29 to 13  I reviewed the patients updated PMH, FH, and SocHx.    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 She has never used smokeless tobacco. She reports current alcohol use. She reports that she does not use drugs.  Current Meds  Medication Sig   acetaminophen (TYLENOL) 500 MG tablet Take 500 mg by mouth every 6 (six) hours as needed for headache.   ALPRAZolam (XANAX) 0.5 MG tablet Take 1 tablet (0.5 mg total) by mouth daily as needed (panic attack).   amLODipine (NORVASC) 10 MG tablet Take 1 tablet (10 mg total) by mouth  daily.   coconut oil OIL Apply 1 application topically as needed.   enoxaparin (LOVENOX) 60 MG/0.6ML injection Inject 60 mg into the skin daily.   Famotidine (PEPCID AC MAXIMUM STRENGTH) 20 MG CHEW Chew 1 tablet by mouth daily as needed (For heartburn.).   hydrochlorothiazide (HYDRODIURIL) 25 MG tablet Take 1 tablet (25 mg total) by mouth daily.   metFORMIN (GLUCOPHAGE) 500 MG tablet Take 1 tablet (500 mg total) by mouth daily with breakfast.   omeprazole (PRILOSEC) 20 MG capsule Take 1 capsule (20 mg total) by mouth 2 (two) times a day.    Review of Systems: Cardiovascular: negative for chest pain, palpitations, leg swelling, orthopnea Respiratory: negative for SOB, wheezing or persistent cough Gastrointestinal: negative for abdominal pain Genitourinary: negative for dysuria or gross hematuria   Objective  Vitals: BP 124/82    Pulse 82    Temp 98.1 F (36.7 C) (Temporal)    Ht 5\' 5"  (1.651 m)    Wt 242 lb (109.8 kg)    SpO2 98%    BMI 40.27 kg/m  General: no acute distress  Psych:  Alert and oriented, normal mood and affect  Commons side effects, risks, benefits, and alternatives for medications and treatment plan prescribed today were discussed, and the patient expressed understanding of the given instructions. Patient is instructed to call or message via MyChart if he/she has any questions or concerns regarding our treatment plan. No barriers to understanding were identified. We discussed Red Flag symptoms and signs in detail. Patient expressed understanding regarding what to do in case of urgent or emergency type symptoms.   Medication list was reconciled, printed and provided to the patient in AVS. Patient instructions and summary information was reviewed with the patient as documented in the AVS. This note was prepared with assistance of Dragon voice recognition software. Occasional wrong-word or sound-a-like substitutions may have occurred due to the inherent limitations of  voice recognition software  This visit occurred during the SARS-CoV-2 public health emergency.  Safety protocols were in place, including screening questions prior to the visit, additional usage of staff PPE, and extensive cleaning of exam room while observing appropriate contact time as indicated for disinfecting solutions.

## 2019-08-21 NOTE — Patient Instructions (Signed)
Please return in 8 weeks to recheck anxiety and panic.  If you have any questions or concerns, please don't hesitate to send me a message via MyChart or call the office at [phone]. Thank you for visiting with [place] today! It's our pleasure caring for you.  Depression/anxiety  Medications:  Taking the medicine as directed and not missing any doses is one of the best things you can do to treat your depression.  Here are some things to keep in mind:  1) Side effects (stomach upset, some increased anxiety) may happen before you notice a benefit.  These side effects typically go away over time. 2) Changes to your dose of medicine or a change in medication all together is sometimes necessary 3) Most people need to be on medication at least 6-12 months 4) Many people will notice an improvement within two weeks but the full effect of the medication can take up to 4-6 weeks 5) Stopping the medication when you start feeling better often results in a return of symptoms 6) If you start having thoughts of hurting yourself or others after starting this medicine, please call the office immediately at [phone].

## 2019-09-21 ENCOUNTER — Other Ambulatory Visit: Payer: Self-pay | Admitting: Family Medicine

## 2019-09-21 ENCOUNTER — Encounter: Payer: Self-pay | Admitting: Family Medicine

## 2019-10-08 ENCOUNTER — Other Ambulatory Visit: Payer: Self-pay | Admitting: Family Medicine

## 2019-10-09 NOTE — Telephone Encounter (Signed)
#  90,3 for Sertraline 50mg ? Please advise

## 2019-10-09 NOTE — Telephone Encounter (Signed)
Please call pt to clarify dose: did she need to increase to 100mg  daily or has she stabilized on the 50mg  dose? I've pended the 50. Thanks.

## 2019-10-11 MED ORDER — SERTRALINE HCL 50 MG PO TABS: 50.0000 mg | ORAL_TABLET | Freq: Every day | ORAL | 3 refills | Status: DC

## 2019-10-11 NOTE — Addendum Note (Signed)
Addended by: Tharon Aquas on: 10/11/2019 09:36 AM   Modules accepted: Orders

## 2019-10-27 ENCOUNTER — Other Ambulatory Visit: Payer: Self-pay

## 2019-10-27 ENCOUNTER — Ambulatory Visit (INDEPENDENT_AMBULATORY_CARE_PROVIDER_SITE_OTHER): Payer: BC Managed Care – PPO | Admitting: Family Medicine

## 2019-10-27 ENCOUNTER — Encounter: Payer: Self-pay | Admitting: Family Medicine

## 2019-10-27 VITALS — BP 130/88 | HR 80 | Temp 98.4°F | Ht 65.0 in | Wt 256.6 lb

## 2019-10-27 DIAGNOSIS — E559 Vitamin D deficiency, unspecified: Secondary | ICD-10-CM | POA: Diagnosis not present

## 2019-10-27 DIAGNOSIS — F410 Panic disorder [episodic paroxysmal anxiety] without agoraphobia: Secondary | ICD-10-CM | POA: Diagnosis not present

## 2019-10-27 DIAGNOSIS — D500 Iron deficiency anemia secondary to blood loss (chronic): Secondary | ICD-10-CM

## 2019-10-27 DIAGNOSIS — I10 Essential (primary) hypertension: Secondary | ICD-10-CM

## 2019-10-27 DIAGNOSIS — Z23 Encounter for immunization: Secondary | ICD-10-CM

## 2019-10-27 DIAGNOSIS — E282 Polycystic ovarian syndrome: Secondary | ICD-10-CM

## 2019-10-27 NOTE — Progress Notes (Signed)
Subjective  CC:  Chief Complaint  Patient presents with  . Anxiety    doing better  . Anemia    taking iron  . Hypertension    HPI: Sabrina White is a 34 y.o. female who presents to the office today to address the problems listed above in the chief complaint, mood problems.  Started Zoloft about 2 months ago and feels like a different person.  Has not had a panic attack in 1 month.  No longer feeling chronically anxious.  Sleep is good now.  No adverse effects.  Mood is much improved.  Home life is now working better.  Happier.  Rare use of Xanax since starting Zoloft.  Taking 50 mg daily.  See last notes.  Currently seeing a counselor that specializes maternal emotional health.  Everything is improving.  Iron deficiency anemia: Has been on iron supplements, 6 months postpartum.  Due for recheck.  Vitamin D deficiency: Due for recheck.  Taking supplements.  Obesity and PCOS: Continues to struggle with weight loss.  Has done everything: CrossFit, now working hard on the [employer] bike.  Eats mostly a clean diet.  Has been chronically overweight.  Has been thinking about gastric band surgery.  Has used Saxenda and appetite suppressants.  Has tried multiple diets.  Even with crosstraining 6 days a week remains overweight.  Depression screen [facility] 2/9 07/26/2019 06/18/2017 04/05/2017  Decreased Interest 0 0 0  Down, Depressed, Hopeless 0 0 0  PHQ - 2 Score 0 0 0  Altered sleeping 3 0 0  Tired, decreased energy 3 0 0  Change in appetite 1 0 0  Feeling bad or failure about yourself  0 0 0  Trouble concentrating 0 0 0  Moving slowly or fidgety/restless 0 0 0  Suicidal thoughts 0 0 0  PHQ-9 Score 7 0 0  Difficult doing work/chores - Not difficult at all Not difficult at all   GAD 7 : Generalized Anxiety Score 07/26/2019  Nervous, Anxious, on Edge 1  Control/stop worrying 0  Worry too much - different things 0  Trouble relaxing 0  Restless 1  Easily annoyed or irritable 0  Afraid -  awful might happen 0  Total GAD 7 Score 2    Assessment  1. Panic attacks   2. Iron deficiency anemia due to chronic blood loss   3. Benign hypertension   4. Vitamin D deficiency   5. PCOS (polycystic ovarian syndrome)      Plan   Anxiety/panic: Much improved.  Continue Zoloft.  Continue counseling.  Recheck 3 months  Hypertension is controlled: Continue medicines.  Recheck iron levels, CBC and vitamin D levels  PCOS and obesity: Counseling done.  Recommend working on self acceptance, exercising for thickness and health reasons and once mental health is stabilized for 3 to 6 months we will consider trial of Ozempic.  Also consider healthy weight and wellness clinic.  Would defer for now given panic and anxiety.  Recommend decreasing intensity workouts which may work better for her for weight loss.  Follow up: 3 to 4 months for complete physical, hypertension and mood follow-up Orders Placed This Encounter  Procedures  . CBC with Differential/Platelet  . Iron, TIBC and Ferritin Panel  . VITAMIN D 25 Hydroxy (Vit-D Deficiency, Fractures)   No orders of the defined types were placed in this encounter.     I reviewed the patients updated PMH, FH, and SocHx.    Patient Active Problem List  Diagnosis Date Noted  . Vitamin D deficiency 10/27/2019  . Panic attacks 10/27/2019  . Iron deficiency anemia due to chronic blood loss 10/27/2019  . History of classical cesarean section 07/27/2019  . History of maternal deep vein thrombosis (DVT) 05/10/2019  . Hypothyroidism 05/10/2019  . 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 Family history of breast cancer in mother 04/05/2017  . Morbid obesity due to excess calories (HCC) 06/24/2015  . Benign hypertension 04/01/2015  . Gastroesophageal reflux disease without esophagitis 04/01/2015  . PCOS (polycystic ovarian syndrome) 04/01/2015   Current Meds  Medication Sig  . ALPRAZolam (XANAX) 0.5 MG tablet Take 0.5-1 tablets (0.25-0.5 mg  total) by mouth 2 (two) times daily as needed for anxiety (panic attack).  Marland Kitchen amLODipine (NORVASC) 10 MG tablet Take 1 tablet (10 mg total) by mouth daily.  . Famotidine (PEPCID AC MAXIMUM STRENGTH) 20 MG CHEW Chew 1 tablet by mouth daily as needed (For heartburn.).  Marland Kitchen hydrochlorothiazide (HYDRODIURIL) 25 MG tablet Take 1 tablet (25 mg total) by mouth daily.  Marland Kitchen omeprazole (PRILOSEC) 20 MG capsule TAKE 1 CAPSULE(20 MG) BY MOUTH TWICE DAILY  . sertraline (ZOLOFT) 50 MG tablet Take 1 tablet (50 mg total) by mouth daily.  . [DISCONTINUED] acetaminophen (TYLENOL) 500 MG tablet Take 500 mg by mouth every 6 (six) hours as needed for headache.    Allergies: Patient is allergic to codeine, nifedipine, and raspberry. Family history:  Patient family history includes Breast cancer in her maternal grandmother, mother, and paternal aunt; Hypertension in her father. Social History   Socioeconomic History  . Marital status: Married    Spouse name: Not on file  . Number of children: Not on file  . Years of education: Not on file  . Highest education level: Not on file  Occupational History    Employer: [employer]  Tobacco Use  . Smoking status: Never Smoker  . Smokeless tobacco: Never Used  Vaping Use  . Vaping Use: Never used  Substance and Sexual Activity  . Alcohol use: Yes    Comment: occasional  . Drug use: No  . Sexual activity: Yes  Other Topics Concern  . Not on file  Social History Narrative  . Not on file   Social Determinants of Health   Financial Resource Strain:   . Difficulty of Paying Living Expenses: Not on file  Food Insecurity:   . Worried About Programme researcher, broadcasting/film/video in the Last Year: Not on file  . Ran Out of Food in the Last Year: Not on file  Transportation Needs:   . Lack of Transportation (Medical): Not on file  . Lack of Transportation (Non-Medical): Not on file  Physical Activity:   . Days of Exercise per Week: Not on file  . Minutes of Exercise per  Session: Not on file  Stress:   . Feeling of Stress : Not on file  Social Connections:   . Frequency of Communication with Friends and Family: Not on file  . Frequency of Social Gatherings with Friends and Family: Not on file  . Attends Religious Services: Not on file  . Active Member of Clubs or Organizations: Not on file  . Attends Banker Meetings: Not on file  . Marital Status: Not on file     Review of Systems: Constitutional: Negative for fever malaise or anorexia Cardiovascular: negative for chest pain Respiratory: negative for SOB or persistent cough Gastrointestinal: negative for abdominal pain  Objective  Vitals: BP 130/88  Pulse 80   Temp 98.4 F (36.9 C) (Temporal)   Ht 5\' 5"  (1.651 m)   Wt 256 lb 9.6 oz (116.4 kg)   LMP 08/08/2019   SpO2 97%   BMI 42.70 kg/m  General: no acute distress, well appearing, no apparent distress, well groomed Psych:  Alert and oriented x 3,normal mood, behavior, speech, dress, and thought processes.     Commons side effects, risks, benefits, and alternatives for medications and treatment plan prescribed today were discussed, and the patient expressed understanding of the given instructions. Patient is instructed to call or message via MyChart if he/she has any questions or concerns regarding our treatment plan. No barriers to understanding were identified. We discussed Red Flag symptoms and signs in detail. Patient expressed understanding regarding what to do in case of urgent or emergency type symptoms.   Medication list was reconciled, printed and provided to the patient in AVS. Patient instructions and summary information was reviewed with the patient as documented in the AVS. This note was prepared with assistance of Dragon voice recognition software. Occasional wrong-word or sound-a-like substitutions may have occurred due to the inherent limitations of voice recognition software

## 2019-10-27 NOTE — Patient Instructions (Signed)
Please return in 3-4 months for your annual complete physical; please come fasting. And recheck mood.  I will release your lab results to you on your MyChart account with further instructions. Please reply with any questions.   If you have any questions or concerns, please don't hesitate to send me a message via MyChart or call the office at [phone]. Thank you for visiting with [place] today! It's our pleasure caring for you.

## 2019-10-28 LAB — CBC WITH DIFFERENTIAL/PLATELET
Absolute Monocytes: 383 cells/uL (ref 200–950)
Basophils Absolute: 68 cells/uL (ref 0–200)
Basophils Relative: 0.8 %
Eosinophils Absolute: 493 cells/uL (ref 15–500)
Eosinophils Relative: 5.8 %
HCT: 43.4 % (ref 35.0–45.0)
Hemoglobin: 14.1 g/dL (ref 11.7–15.5)
Lymphs Abs: 2559 cells/uL (ref 850–3900)
MCH: 27.4 pg (ref 27.0–33.0)
MCHC: 32.5 g/dL (ref 32.0–36.0)
MCV: 84.4 fL (ref 80.0–100.0)
MPV: 9.8 fL (ref 7.5–12.5)
Monocytes Relative: 4.5 %
Neutro Abs: 4998 cells/uL (ref 1500–7800)
Neutrophils Relative %: 58.8 %
Platelets: 254 10*3/uL (ref 140–400)
RBC: 5.14 10*6/uL — ABNORMAL HIGH (ref 3.80–5.10)
RDW: 15.8 % — ABNORMAL HIGH (ref 11.0–15.0)
Total Lymphocyte: 30.1 %
WBC: 8.5 10*3/uL (ref 3.8–10.8)

## 2019-10-28 LAB — VITAMIN D 25 HYDROXY (VIT D DEFICIENCY, FRACTURES): Vit D, 25-Hydroxy: 30 ng/mL (ref 30–100)

## 2019-10-28 LAB — IRON,TIBC AND FERRITIN PANEL
%SAT: 15 % (calc) — ABNORMAL LOW (ref 16–45)
Ferritin: 21 ng/mL (ref 16–154)
Iron: 49 ug/dL (ref 40–190)
TIBC: 322 mcg/dL (calc) (ref 250–450)

## 2019-11-03 ENCOUNTER — Other Ambulatory Visit: Payer: Self-pay

## 2019-11-03 MED ORDER — ALPRAZOLAM 0.5 MG PO TABS: 0.2500 mg | ORAL_TABLET | Freq: Two times a day (BID) | ORAL | 0 refills | Status: DC | PRN

## 2019-11-03 NOTE — Telephone Encounter (Signed)
Last refill: 08/21/19 #30, 0 Last OV: 10/27/19

## 2019-11-27 NOTE — Progress Notes (Deleted)
Office Visit Note  Patient: Sabrina White             Date of Birth: 1985-05-06           MRN: [id number]             PCP: Leamon Arnt, MD Referring: Leamon Arnt, MD Visit Date: 12/04/2019 Occupation: @GUAROCC @  Subjective:  No chief complaint on file.   History of Present Illness: Danny Lycia Sachdeva is a 34 y.o. female ***   Activities of Daily Living:  Patient reports morning stiffness for *** {minute/hour:19697}.   Patient {ACTIONS;DENIES/REPORTS:21021675::"Denies"} nocturnal pain.  Difficulty dressing/grooming: {ACTIONS;DENIES/REPORTS:21021675::"Denies"} Difficulty climbing stairs: {ACTIONS;DENIES/REPORTS:21021675::"Denies"} Difficulty getting out of chair: {ACTIONS;DENIES/REPORTS:21021675::"Denies"} Difficulty using hands for taps, buttons, cutlery, and/or writing: {ACTIONS;DENIES/REPORTS:21021675::"Denies"}  No Rheumatology ROS completed.   PMFS History:  Patient Active Problem List   Diagnosis Date Noted  . Vitamin D deficiency 10/27/2019  . Panic attacks 10/27/2019  . Iron deficiency anemia due to chronic blood loss 10/27/2019  . History of classical cesarean section 07/27/2019  . History of maternal deep vein thrombosis (DVT) 05/10/2019  . Hypothyroidism 05/10/2019  . 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 Family history of breast cancer in mother 04/05/2017  . Morbid obesity due to excess calories ([place]) 06/24/2015  . Benign hypertension 04/01/2015  . Gastroesophageal reflux disease without esophagitis 04/01/2015  . PCOS (polycystic ovarian syndrome) 04/01/2015    Past Medical History:  Diagnosis Date  . Family history of breast cancer in mother 04/05/2017   Mother and maternal grandmother, not genetically tested. Paternal aunt, negative BRCA gene tested.  Marland Kitchen GERD (gastroesophageal reflux disease)   . Hypertension   . Hypothyroidism   . PCOS (polycystic ovarian syndrome)     Family History  Problem Relation Age of Onset  . Breast cancer Mother     . Hypertension Father   . Breast cancer Maternal Grandmother   . Breast cancer Paternal Aunt    Past Surgical History:  Procedure Laterality Date  . CESAREAN SECTION N/A 05/06/2019   Procedure: CESAREAN SECTION;  Surgeon: Aloha Gell, MD;  Location: [facility];  Service: Obstetrics;  Laterality: N/A;   Social History   Social History Narrative  . Not on file   Immunization History  Administered Date(s) Administered  . Influenza, Seasonal, Injecte, Preservative Fre 01/24/2015  . Influenza,inj,Quad PF,6+ Mos 10/21/2017, 10/27/2019  . Influenza-Unspecified 12/24/2016  . PFIZER SARS-COV-2 Vaccination 05/21/2019, 06/11/2019  . Tdap 04/28/2010     Objective: Vital Signs: There were no vitals taken for this visit.   Physical Exam   Musculoskeletal Exam: ***  CDAI Exam: CDAI Score: -- Patient Global: --; Provider Global: -- Swollen: --; Tender: -- Joint Exam 12/04/2019   No joint exam has been documented for this visit   There is currently no information documented on the homunculus. Go to the Rheumatology activity and complete the homunculus joint exam.  Investigation: No additional findings.  Imaging: No results found.  Recent Labs: Lab Results  Component Value Date   WBC 8.5 10/27/2019   HGB 14.1 10/27/2019   PLT 254 10/27/2019   NA 138 07/27/2019   K 4.0 07/27/2019   CL 103 07/27/2019   CO2 28 07/27/2019   GLUCOSE 94 07/27/2019   BUN 10 07/27/2019   CREATININE 0.78 07/27/2019   BILITOT 0.3 07/27/2019   ALKPHOS 58 07/27/2019   AST 17 07/27/2019   ALT 26 07/27/2019   PROT 6.3 07/27/2019   ALBUMIN 4.0 07/27/2019  CALCIUM 8.8 07/27/2019   GFRAA >60 05/09/2019    Speciality Comments: No specialty comments available.  Procedures:  No procedures performed Allergies: Codeine, Nifedipine, and Raspberry   Assessment / Plan:     Visit Diagnoses: No diagnosis found.  Orders: No orders of the defined types were placed in this encounter.  No orders  of the defined types were placed in this encounter.   Face-to-face time spent with patient was *** minutes. Greater than 50% of time was spent in counseling and coordination of care.  Follow-Up Instructions: No follow-ups on file.   Ofilia Neas, PA-C  Note - This record has been created using Dragon software.  Chart creation errors have been sought, but may not always  have been located. Such creation errors do not reflect on  the standard of medical care.

## 2019-12-04 ENCOUNTER — Ambulatory Visit: Payer: BC Managed Care – PPO | Admitting: Rheumatology

## 2019-12-25 ENCOUNTER — Ambulatory Visit: Payer: BC Managed Care – PPO | Admitting: Rheumatology

## 2020-01-26 ENCOUNTER — Ambulatory Visit: Payer: BC Managed Care – PPO | Admitting: Family Medicine

## 2020-01-31 ENCOUNTER — Ambulatory Visit (INDEPENDENT_AMBULATORY_CARE_PROVIDER_SITE_OTHER): Payer: BC Managed Care – PPO | Admitting: Family Medicine

## 2020-01-31 ENCOUNTER — Other Ambulatory Visit: Payer: Self-pay

## 2020-01-31 ENCOUNTER — Encounter: Payer: Self-pay | Admitting: Family Medicine

## 2020-01-31 VITALS — BP 132/88 | Temp 98.0°F | Wt 258.8 lb

## 2020-01-31 DIAGNOSIS — Z0000 Encounter for general adult medical examination without abnormal findings: Secondary | ICD-10-CM | POA: Diagnosis not present

## 2020-01-31 DIAGNOSIS — Z1159 Encounter for screening for other viral diseases: Secondary | ICD-10-CM

## 2020-01-31 DIAGNOSIS — K219 Gastro-esophageal reflux disease without esophagitis: Secondary | ICD-10-CM | POA: Diagnosis not present

## 2020-01-31 DIAGNOSIS — F410 Panic disorder [episodic paroxysmal anxiety] without agoraphobia: Secondary | ICD-10-CM | POA: Diagnosis not present

## 2020-01-31 DIAGNOSIS — I10 Essential (primary) hypertension: Secondary | ICD-10-CM

## 2020-01-31 DIAGNOSIS — E282 Polycystic ovarian syndrome: Secondary | ICD-10-CM

## 2020-01-31 MED ORDER — OZEMPIC (0.25 OR 0.5 MG/DOSE) 2 MG/1.5ML ~~LOC~~ SOPN: PEN_INJECTOR | SUBCUTANEOUS | 0 refills | Status: AC

## 2020-01-31 MED ORDER — OZEMPIC (1 MG/DOSE) 2 MG/1.5ML ~~LOC~~ SOPN: 0.5000 mg | PEN_INJECTOR | SUBCUTANEOUS | 5 refills | Status: AC

## 2020-01-31 NOTE — Patient Instructions (Addendum)
Please return in 3 months to recheck weight.   I will release your lab results to you on your MyChart account with further instructions. Please reply with any questions.   If you have any questions or concerns, please don't hesitate to send me a message via MyChart or call the office at [phone]. Thank you for visiting with [place] today! It's our pleasure caring for you.  Semaglutide injection solution What is this medicine? SEMAGLUTIDE (Sem a GLOO tide) is used to improve blood sugar control in adults with type 2 diabetes. This medicine may be used with other diabetes medicines. This drug may also reduce the risk of heart attack or stroke if you have type 2 diabetes and risk factors for heart disease. This medicine may be used for other purposes; ask your health care provider or pharmacist if you have questions. COMMON BRAND NAME(S): OZEMPIC What should I tell my health care provider before I take this medicine? They need to know if you have any of these conditions:  endocrine tumors (MEN 2) or if someone in your family had these tumors  eye disease, vision problems  history of pancreatitis  kidney disease  stomach problems  thyroid cancer or if someone in your family had thyroid cancer  an unusual or allergic reaction to semaglutide, other medicines, foods, dyes, or preservatives  pregnant or trying to get pregnant  breast-feeding How should I use this medicine? This medicine is for injection under the skin of your upper leg (thigh), stomach area, or upper arm. It is given once every week (every 7 days). You will be taught how to prepare and give this medicine. Use exactly as directed. Take your medicine at regular intervals. Do not take it more often than directed. If you use this medicine with insulin, you should inject this medicine and the insulin separately. Do not mix them together. Do not give the injections right next to each other. Change (rotate) injection sites with each  injection. It is important that you put your used needles and syringes in a special sharps container. Do not put them in a trash can. If you do not have a sharps container, call your pharmacist or healthcare provider to get one. A special MedGuide will be given to you by the pharmacist with each prescription and refill. Be sure to read this information carefully each time. This drug comes with INSTRUCTIONS FOR USE. Ask your pharmacist for directions on how to use this drug. Read the information carefully. Talk to your pharmacist or health care provider if you have questions. Talk to your pediatrician regarding the use of this medicine in children. Special care may be needed. Overdosage: If you think you have taken too much of this medicine contact a poison control center or emergency room at once. NOTE: This medicine is only for you. Do not share this medicine with others. What if I miss a dose? If you miss a dose, take it as soon as you can within 5 days after the missed dose. Then take your next dose at your regular weekly time. If it has been longer than 5 days after the missed dose, do not take the missed dose. Take the next dose at your regular time. Do not take double or extra doses. If you have questions about a missed dose, contact your health care provider for advice. What may interact with this medicine?  other medicines for diabetes Many medications may cause changes in blood sugar, these include:  alcohol containing beverages  antiviral medicines for HIV or AIDS  aspirin and aspirin-like drugs  certain medicines for blood pressure, heart disease, irregular heart beat  chromium  diuretics  female hormones, such as estrogens or progestins, birth control pills  fenofibrate  gemfibrozil  isoniazid  lanreotide  female hormones or anabolic steroids  MAOIs like Carbex, Eldepryl, Marplan, Nardil, and Parnate  medicines for weight loss  medicines for allergies, asthma,  cold, or cough  medicines for depression, anxiety, or psychotic disturbances  niacin  nicotine  NSAIDs, medicines for pain and inflammation, like ibuprofen or naproxen  octreotide  pasireotide  pentamidine  phenytoin  probenecid  quinolone antibiotics such as ciprofloxacin, levofloxacin, ofloxacin  some herbal dietary supplements  steroid medicines such as prednisone or cortisone  sulfamethoxazole; trimethoprim  thyroid hormones Some medications can hide the warning symptoms of low blood sugar (hypoglycemia). You may need to monitor your blood sugar more closely if you are taking one of these medications. These include:  beta-blockers, often used for high blood pressure or heart problems (examples include atenolol, metoprolol, propranolol)  clonidine  guanethidine  reserpine This list may not describe all possible interactions. Give your health care provider a list of all the medicines, herbs, non-prescription drugs, or dietary supplements you use. Also tell them if you smoke, drink alcohol, or use illegal drugs. Some items may interact with your medicine. What should I watch for while using this medicine? Visit your doctor or health care professional for regular checks on your progress. Drink plenty of fluids while taking this medicine. Check with your doctor or health care professional if you get an attack of severe diarrhea, nausea, and vomiting. The loss of too much body fluid can make it dangerous for you to take this medicine. A test called the HbA1C (A1C) will be monitored. This is a simple blood test. It measures your blood sugar control over the last 2 to 3 months. You will receive this test every 3 to 6 months. Learn how to check your blood sugar. Learn the symptoms of low and high blood sugar and how to manage them. Always carry a quick-source of sugar with you in case you have symptoms of low blood sugar. Examples include hard sugar candy or glucose tablets.  Make sure others know that you can choke if you eat or drink when you develop serious symptoms of low blood sugar, such as seizures or unconsciousness. They must get medical help at once. Tell your doctor or health care professional if you have high blood sugar. You might need to change the dose of your medicine. If you are sick or exercising more than usual, you might need to change the dose of your medicine. Do not skip meals. Ask your doctor or health care professional if you should avoid alcohol. Many nonprescription cough and cold products contain sugar or alcohol. These can affect blood sugar. Pens should never be shared. Even if the needle is changed, sharing may result in passing of viruses like hepatitis or HIV. Wear a medical ID bracelet or chain, and carry a card that describes your disease and details of your medicine and dosage times. Do not become pregnant while taking this medicine. Women should inform their doctor if they wish to become pregnant or think they might be pregnant. There is a potential for serious side effects to an unborn child. Talk to your health care professional or pharmacist for more information. What side effects may I notice from receiving this medicine? Side effects that you should  report to your doctor or health care professional as soon as possible:  allergic reactions like skin rash, itching or hives, swelling of the face, lips, or tongue  breathing problems  changes in vision  diarrhea that continues or is severe  lump or swelling on the neck  severe nausea  signs and symptoms of infection like fever or chills; cough; sore throat; pain or trouble passing urine  signs and symptoms of low blood sugar such as feeling anxious, confusion, dizziness, increased hunger, unusually weak or tired, sweating, shakiness, cold, irritable, headache, blurred vision, fast heartbeat, loss of consciousness  signs and symptoms of kidney injury like trouble passing urine  or change in the amount of urine  trouble swallowing  unusual stomach upset or pain  vomiting Side effects that usually do not require medical attention (report to your doctor or health care professional if they continue or are bothersome):  constipation  diarrhea  nausea  pain, redness, or irritation at site where injected  stomach upset This list may not describe all possible side effects. Call your doctor for medical advice about side effects. You may report side effects to FDA at 1-800-FDA-1088. Where should I keep my medicine? Keep out of the reach of children. Store unopened pens in a refrigerator between 2 and 8 degrees C (36 and 46 degrees F). Do not freeze. Protect from light and heat. After you first use the pen, it can be stored for 56 days at room temperature between 15 and 30 degrees C (59 and 86 degrees F) or in a refrigerator. Throw away your used pen after 56 days or after the expiration date, whichever comes first. Do not store your pen with the needle attached. If the needle is left on, medicine may leak from the pen. NOTE: This sheet is a summary. It may not cover all possible information. If you have questions about this medicine, talk to your doctor, pharmacist, or health care provider.  2020 Elsevier/Gold Standard (2018-10-25 09:41:51)

## 2020-01-31 NOTE — Progress Notes (Signed)
Subjective  Chief Complaint  Patient presents with  . Annual Exam  . Hypertension  . Anxiety    HPI: Sabrina White is a 34 y.o. female who presents to [facility] today for a Female Wellness Visit. She also has the concerns and/or needs as listed above in the chief complaint. These will be addressed in addition to the Health Maintenance Visit.   Wellness Visit: annual visit with health maintenance review and exam without Pap   HM: up to date.  Screens are up-to-date.  Weight is trending down.  Continues to work vigorously multiple times during the week.  Eating clean diet.  Immunizations are up-to-date. Chronic disease f/u and/or acute problem visit: (deemed necessary to be done in addition to the wellness visit):  HTN: Doing well on current antihypertensives.  No chest pain or shortness of breath.  Blood pressures been controlled.  Mood: Panic attacks and anxiety now well controlled on Zoloft 50 mg daily.  No longer having panic attacks.  Rare use of Xanax.  Feels stable.  Obesity: Has been overweight for most of her life.  PCOS makes it harder.  She has failed Saxenda, phentermine, Metformin and multiple exercise regimens including CrossFit.  She has been to nutrition counseling.  She would be interested in further medical management weight loss options.  GERD is well controlled omeprazole daily.  Assessment  1. Annual physical exam   2. Benign hypertension   3. Morbid obesity due to excess calories ([place])   4. PCOS (polycystic ovarian syndrome)   5. Panic attacks   6. Gastroesophageal reflux disease without esophagitis      Plan  Female Wellness Visit:  Age appropriate Health Maintenance and Prevention measures were discussed with patient. Included topics are cancer screening recommendations, ways to keep healthy (see AVS) including dietary and exercise recommendations, regular eye and dental care, use of seat belts, and avoidance of moderate  alcohol use and tobacco use.  Up-to-date  BMI: discussed patient's BMI and encouraged positive lifestyle modifications to help get to or maintain a target BMI.  HM needs and immunizations were addressed and ordered. See below for orders. See HM and immunization section for updates.  Routine labs and screening tests ordered including cmp, cbc and lipids where appropriate.  Discussed recommendations regarding Vit D and calcium supplementation (see AVS)  Chronic disease management visit and/or acute problem visit:  Morbid obesity: Counseling done.  Given PCOS metabolic abnormalities recommend Ozempic weekly.  Education given.  Recheck 3 months  Hypertension well controlled.  Check renal function electrolytes and lipids  Panic attacks and anxiety are well controlled on Zoloft.  Continue 50 mg dose  GERD is controlled on omeprazole 20 mg daily   Follow up: Return in about 6 months (around 07/31/2020) for follow up Hypertension.  Orders Placed This Encounter  Procedures  . CBC with Differential/Platelet  . COMPLETE METABOLIC PANEL WITH GFR  . Lipid panel  . TSH   No orders of the defined types were placed in this encounter.     Lifestyle: Body mass index is 43.07 kg/m. Wt Readings from Last 3 Encounters:  01/31/20 258 lb 12.8 oz (117.4 kg)  10/27/19 256 lb 9.6 oz (116.4 kg)  08/21/19 242 lb (109.8 kg)    Patient Active Problem List   Diagnosis Date Noted  . Vitamin D deficiency 10/27/2019  . Panic attacks 10/27/2019  . History of classical cesarean section 07/27/2019  . History of maternal deep vein thrombosis (  DVT) 05/10/2019  . 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 Family history of breast cancer in mother 04/05/2017    Mother and maternal grandmother, not genetically tested. Paternal aunt, negative BRCA gene tested.   . Morbid obesity due to excess calories ([place]) 06/24/2015  . Benign hypertension 04/01/2015  . Gastroesophageal reflux disease without esophagitis  04/01/2015  . PCOS (polycystic ovarian syndrome) 04/01/2015   Health Maintenance  Topic Date Due  . Hepatitis C Screening  Never done  . TETANUS/TDAP  04/27/2020  . PAP SMEAR-Modifier  03/27/2022  . INFLUENZA VACCINE  Completed  . COVID-19 Vaccine  Completed  . HIV Screening  Completed   Immunization History  Administered Date(s) Administered  . Influenza, Seasonal, Injecte, Preservative Fre 01/24/2015  . Influenza,inj,Quad PF,6+ Mos 10/21/2017, 10/27/2019  . Influenza-Unspecified 12/24/2016  . PFIZER SARS-COV-2 Vaccination 05/21/2019, 06/11/2019  . Tdap 04/28/2010   We updated and reviewed the patient's past history in detail and it is documented below. Allergies: Patient is allergic to codeine, nifedipine, and raspberry. Past Medical History Patient  has a past medical history of Family history of breast cancer in mother (04/05/2017), GERD (gastroesophageal reflux disease), Hypertension, Hypothyroidism, and PCOS (polycystic ovarian syndrome). Past Surgical History Patient  has a past surgical history that includes Cesarean section (N/A, 05/06/2019). Family History: Patient family history includes Breast cancer in her maternal grandmother, mother, and paternal aunt; Hypertension in her father. Social History:  Patient  reports that she has never smoked. She has never used smokeless tobacco. She reports current alcohol use. She reports that she does not use drugs.  Review of Systems: Constitutional: negative for fever or malaise Ophthalmic: negative for photophobia, double vision or loss of vision Cardiovascular: negative for chest pain, dyspnea on exertion, or new LE swelling Respiratory: negative for SOB or persistent cough Gastrointestinal: negative for abdominal pain, change in bowel habits or melena Genitourinary: negative for dysuria or gross hematuria, no abnormal uterine bleeding or disharge Musculoskeletal: negative for new gait disturbance or muscular  weakness Integumentary: negative for new or persistent rashes, no breast lumps Neurological: negative for TIA or stroke symptoms Psychiatric: negative for SI or delusions Allergic/Immunologic: negative for hives  Patient Care Team    Relationship Specialty Notifications Start End  Leamon Arnt, MD PCP - General Family Medicine  04/05/17     Objective  Vitals: BP 132/88   Temp 98 F (36.7 C)   Wt 258 lb 12.8 oz (117.4 kg)   BMI 43.07 kg/m  General:  Well developed, well nourished, no acute distress  Psych:  Alert and orientedx3,normal mood and affect HEENT:  Normocephalic, atraumatic, non-icteric sclera,  supple neck without adenopathy, mass or thyromegaly Cardiovascular:  Normal S1, S2, RRR without gallop, rub or murmur Respiratory:  Good breath sounds bilaterally, CTAB with normal respiratory effort Gastrointestinal: normal bowel sounds, soft, non-tender, no noted masses. No HSM MSK: no deformities, contusions. Joints are without erythema or swelling.  Skin:  Warm, no rashes or suspicious lesions noted Neurologic:    Mental status is normal. CN 2-11 are normal. Gross motor and sensory exams are normal. Normal gait. No tremor     Commons side effects, risks, benefits, and alternatives for medications and treatment plan prescribed today were discussed, and the patient expressed understanding of the given instructions. Patient is instructed to call or message via MyChart if he/she has any questions or concerns regarding our treatment plan. No barriers to understanding were identified. We discussed Red Flag symptoms and signs in detail. Patient  expressed understanding regarding what to do in case of urgent or emergency type symptoms.   Medication list was reconciled, printed and provided to the patient in AVS. Patient instructions and summary information was reviewed with the patient as documented in the AVS. This note was prepared with assistance of Dragon voice recognition software.  Occasional wrong-word or sound-a-like substitutions may have occurred due to the inherent limitations of voice recognition software  This visit occurred during the SARS-CoV-2 public health emergency.  Safety protocols were in place, including screening questions prior to the visit, additional usage of staff PPE, and extensive cleaning of exam room while observing appropriate contact time as indicated for disinfecting solutions.

## 2020-02-01 LAB — COMPLETE METABOLIC PANEL WITH GFR
AG Ratio: 1.5 (calc) (ref 1.0–2.5)
ALT: 29 U/L (ref 6–29)
AST: 18 U/L (ref 10–30)
Albumin: 4.1 g/dL (ref 3.6–5.1)
Alkaline phosphatase (APISO): 65 U/L (ref 31–125)
BUN: 11 mg/dL (ref 7–25)
CO2: 31 mmol/L (ref 20–32)
Calcium: 9 mg/dL (ref 8.6–10.2)
Chloride: 99 mmol/L (ref 98–110)
Creat: 0.97 mg/dL (ref 0.50–1.10)
GFR, Est African American: 88 mL/min/{1.73_m2} (ref >=60)
GFR, Est Non African American: 76 mL/min/{1.73_m2} (ref >=60)
Globulin: 2.8 g/dL (calc) (ref 1.9–3.7)
Glucose, Bld: 125 mg/dL — ABNORMAL HIGH (ref 65–99)
Potassium: 3.5 mmol/L (ref 3.5–5.3)
Sodium: 138 mmol/L (ref 135–146)
Total Bilirubin: 0.4 mg/dL (ref 0.2–1.2)
Total Protein: 6.9 g/dL (ref 6.1–8.1)

## 2020-02-01 LAB — CBC WITH DIFFERENTIAL/PLATELET
Absolute Monocytes: 324 cells/uL (ref 200–950)
Basophils Absolute: 66 cells/uL (ref 0–200)
Basophils Relative: 0.8 %
Eosinophils Absolute: 656 cells/uL — ABNORMAL HIGH (ref 15–500)
Eosinophils Relative: 7.9 %
HCT: 43.9 % (ref 35.0–45.0)
Hemoglobin: 14.6 g/dL (ref 11.7–15.5)
Lymphs Abs: 2332 cells/uL (ref 850–3900)
MCH: 28.4 pg (ref 27.0–33.0)
MCHC: 33.3 g/dL (ref 32.0–36.0)
MCV: 85.4 fL (ref 80.0–100.0)
MPV: 9.9 fL (ref 7.5–12.5)
Monocytes Relative: 3.9 %
Neutro Abs: 4922 cells/uL (ref 1500–7800)
Neutrophils Relative %: 59.3 %
Platelets: 269 10*3/uL (ref 140–400)
RBC: 5.14 10*6/uL — ABNORMAL HIGH (ref 3.80–5.10)
RDW: 13 % (ref 11.0–15.0)
Total Lymphocyte: 28.1 %
WBC: 8.3 10*3/uL (ref 3.8–10.8)

## 2020-02-01 LAB — LIPID PANEL
Cholesterol: 206 mg/dL — ABNORMAL HIGH (ref <200)
HDL: 34 mg/dL — ABNORMAL LOW (ref >=50)
LDL Cholesterol (Calc): 140 mg/dL (calc) — ABNORMAL HIGH
Non-HDL Cholesterol (Calc): 172 mg/dL (calc) — ABNORMAL HIGH (ref <130)
Total CHOL/HDL Ratio: 6.1 (calc) — ABNORMAL HIGH (ref <5.0)
Triglycerides: 188 mg/dL — ABNORMAL HIGH (ref <150)

## 2020-02-01 LAB — HEPATITIS C ANTIBODY
Hepatitis C Ab: NONREACTIVE
SIGNAL TO CUT-OFF: 0.02 (ref <1.00)

## 2020-02-01 LAB — HEMOGLOBIN A1C
Hgb A1c MFr Bld: 5.3 % of total Hgb (ref <5.7)
Mean Plasma Glucose: 105 mg/dL
eAG (mmol/L): 5.8 mmol/L

## 2020-02-01 LAB — TSH: TSH: 2.45 mIU/L

## 2020-03-05 ENCOUNTER — Encounter: Payer: Self-pay | Admitting: Family Medicine

## 2020-03-06 MED ORDER — ALPRAZOLAM 0.5 MG PO TABS
0.2500 mg | ORAL_TABLET | Freq: Two times a day (BID) | ORAL | 0 refills | Status: DC | PRN
Start: 2020-03-06 — End: 2021-01-14

## 2020-03-25 ENCOUNTER — Encounter: Payer: Self-pay | Admitting: Family Medicine

## 2020-03-26 MED ORDER — PANTOPRAZOLE SODIUM 40 MG PO TBEC: 40.0000 mg | DELAYED_RELEASE_TABLET | Freq: Every day | ORAL | 3 refills | Status: DC

## 2020-07-17 ENCOUNTER — Other Ambulatory Visit: Payer: Self-pay | Admitting: Family Medicine

## 2020-07-23 ENCOUNTER — Encounter: Payer: Self-pay | Admitting: Family Medicine

## 2020-07-31 ENCOUNTER — Ambulatory Visit (INDEPENDENT_AMBULATORY_CARE_PROVIDER_SITE_OTHER): Payer: BC Managed Care – PPO | Admitting: Family Medicine

## 2020-07-31 ENCOUNTER — Other Ambulatory Visit: Payer: Self-pay

## 2020-07-31 ENCOUNTER — Encounter: Payer: Self-pay | Admitting: Family Medicine

## 2020-07-31 VITALS — BP 128/90 | HR 91 | Temp 98.1°F | Ht 65.0 in | Wt 259.2 lb

## 2020-07-31 DIAGNOSIS — I10 Essential (primary) hypertension: Secondary | ICD-10-CM | POA: Diagnosis not present

## 2020-07-31 DIAGNOSIS — E282 Polycystic ovarian syndrome: Secondary | ICD-10-CM

## 2020-07-31 DIAGNOSIS — J309 Allergic rhinitis, unspecified: Secondary | ICD-10-CM | POA: Diagnosis not present

## 2020-07-31 NOTE — Patient Instructions (Signed)
Please return in 6 months for your annual complete physical; please come fasting.  We will call you with information regarding your referral appointment. [facility] AND [facility].  If you do not hear from [place] within the next 2 weeks, please let me know. It can take 1-2 weeks to get appointments set up with the specialists.   If you have any questions or concerns, please don't hesitate to send me a message via MyChart or call the office at [phone]. Thank you for visiting with [place] today! It's our pleasure caring for you.

## 2020-07-31 NOTE — Progress Notes (Signed)
Subjective  CC:  Chief Complaint  Patient presents with  . Hypertension  . Weight Loss    Wants to discuss    HPI: Sabrina White is a 35 y.o. female who presents to the office today to address the problems listed above in the chief complaint.  Hypertension f/u: Control is good . Pt reports she is doing well. taking medications as instructed, no medication side effects noted, no TIAs, no chest pain on exertion, no dyspnea on exertion, no swelling of ankles.  She has not yet taken her blood pressure pill this morning so blood pressure is mildly elevated today.  Home readings are normal.  Denies adverse effects from his BP medications. Compliance with medication is good.   PCOS and morbid obesity: Please refer to her recent MyChart message.  This continues to be her main struggle.  She continues to exercise regularly, following weight watchers and has failed multiple other exercise programs and weight loss medications including metformin, phentermine, Saxenda, Ozempic.  She is interested in other options of weight loss.  She understands that her difficulty losing weight in part is related to her PCOS.  We reviewed her diet in detail.  Current exercise includes palatine cycling multiple times weekly.  She has also been very active in CrossFit.  Has seasonal allergies, awoke yesterday with increased postnasal drainage and mild sore throat.  Does not feel sick.  No myalgias, fevers, sinus pain or cough.  She is vaccinated against COVID.  No known exposures.  Assessment  1. Benign hypertension   2. PCOS (polycystic ovarian syndrome)   3. Morbid obesity (HCC)   4. Chronic allergic rhinitis      Plan    Hypertension f/u: BP control is fairly well controlled.  We will continue current medications, amlodipine and HCTZ.  She will continue to monitor at home.  Recheck 6 months  Obesity and PCOS f/u: Education given.  Metabolic abnormalities make it difficult for her to lose weight.  Will  refer to [facility] and also bariatrics to get more information regarding surgical options.  Continue weight watchers and regular exercise now for fitness.  Allergic rhinitis: Start with allergy medications, on antihistamine and nasal spray.  Monitor for worsening symptoms.  Recheck home COVID test if progressing.  Discussed signs of sinusitis. Education regarding management of these chronic disease states was given. Management strategies discussed on successive visits include dietary and exercise recommendations, goals of achieving and maintaining IBW, and lifestyle modifications aiming for adequate sleep and minimizing stressors.   Follow up: 6 months for complete physical  Orders Placed This Encounter  Procedures  . Amb Ref to Medical Weight Management  . Amb Referral to Bariatric Surgery   No orders of the defined types were placed in this encounter.     BP Readings from Last 3 Encounters:  07/31/20 128/90  01/31/20 132/88  10/27/19 130/88   Wt Readings from Last 3 Encounters:  07/31/20 259 lb 3.2 oz (117.6 kg)  01/31/20 258 lb 12.8 oz (117.4 kg)  10/27/19 256 lb 9.6 oz (116.4 kg)    Lab Results  Component Value Date   CHOL 206 (H) 01/31/2020   CHOL 185 04/05/2017   Lab Results  Component Value Date   HDL 34 (L) 01/31/2020   HDL 31.50 (L) 04/05/2017   Lab Results  Component Value Date   LDLCALC 140 (H) 01/31/2020   Lab Results  Component Value Date   TRIG 188 (H) 01/31/2020  TRIG 211.0 (H) 04/05/2017   Lab Results  Component Value Date   CHOLHDL 6.1 (H) 01/31/2020   CHOLHDL 6 04/05/2017   Lab Results  Component Value Date   LDLDIRECT 117.0 04/05/2017   Lab Results  Component Value Date   CREATININE 0.97 01/31/2020   BUN 11 01/31/2020   NA 138 01/31/2020   K 3.5 01/31/2020   CL 99 01/31/2020   CO2 31 01/31/2020    The ASCVD Risk score Denman George DC Jr., et al., 2013) failed to calculate for the following reasons:   The 2013  ASCVD risk score is only valid for ages 61 to 73  I reviewed the patients updated PMH, FH, and SocHx.    Patient Active Problem List   Diagnosis Date Noted  . Vitamin D deficiency 10/27/2019  . Panic attacks 10/27/2019  . History of classical cesarean section 07/27/2019  . History of maternal deep vein thrombosis (DVT) 05/10/2019  . 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 Family history of breast cancer in mother 04/05/2017  . Morbid obesity (HCC) 06/24/2015  . Benign hypertension 04/01/2015  . Gastroesophageal reflux disease without esophagitis 04/01/2015  . PCOS (polycystic ovarian syndrome) 04/01/2015    Allergies: Codeine, Nifedipine, and Raspberry  Social History: Patient  reports that she has never smoked. She has never used smokeless tobacco. She reports current alcohol use. She reports that she does not use drugs.  Current Meds  Medication Sig  . ALPRAZolam (XANAX) 0.5 MG tablet Take 0.5-1 tablets (0.25-0.5 mg total) by mouth 2 (two) times daily as needed for anxiety (panic attack).  Marland Kitchen amLODipine (NORVASC) 10 MG tablet Take 1 tablet (10 mg total) by mouth daily.  . Famotidine (PEPCID AC MAXIMUM STRENGTH) 20 MG CHEW Chew 1 tablet by mouth daily as needed (For heartburn.).  Marland Kitchen hydrochlorothiazide (HYDRODIURIL) 25 MG tablet Take 1 tablet (25 mg total) by mouth daily.  . pantoprazole (PROTONIX) 40 MG tablet TAKE 1 TABLET(40 MG) BY MOUTH DAILY  . sertraline (ZOLOFT) 50 MG tablet Take 1 tablet (50 mg total) by mouth daily.    Review of Systems: Cardiovascular: negative for chest pain, palpitations, leg swelling, orthopnea Respiratory: negative for SOB, wheezing or persistent cough Gastrointestinal: negative for abdominal pain Genitourinary: negative for dysuria or gross hematuria  Objective  Vitals: BP 128/90   Pulse 91   Temp 98.1 F (36.7 C) (Temporal)   Ht 5\' 5"  (1.651 m)   Wt 259 lb 3.2 oz (117.6 kg)   LMP 06/25/2020   SpO2 96%   BMI 43.13 kg/m  General: no acute  distress  Psych:  Alert and oriented, normal mood and affect HEENT:  Normocephalic, atraumatic, supple neck, TMs clear bilaterally, oropharynx clear.  No cervical lymphadenopathy Cardiovascular:  RRR without murmur. no edema Respiratory:  Good breath sounds bilaterally, CTAB with normal respiratory effort Skin:  Warm, no rashes Neurologic:   Mental status is normal  Commons side effects, risks, benefits, and alternatives for medications and treatment plan prescribed today were discussed, and the patient expressed understanding of the given instructions. Patient is instructed to call or message via MyChart if he/she has any questions or concerns regarding our treatment plan. No barriers to understanding were identified. We discussed Red Flag symptoms and signs in detail. Patient expressed understanding regarding what to do in case of urgent or emergency type symptoms.   Medication list was reconciled, printed and provided to the patient in AVS. Patient instructions and summary information was reviewed with the patient as documented  in the AVS. This note was prepared with assistance of Dragon voice recognition software. Occasional wrong-word or sound-a-like substitutions may have occurred due to the inherent limitations of voice recognition software  This visit occurred during the SARS-CoV-2 public health emergency.  Safety protocols were in place, including screening questions prior to the visit, additional usage of staff PPE, and extensive cleaning of exam room while observing appropriate contact time as indicated for disinfecting solutions.

## 2020-08-15 ENCOUNTER — Telehealth: Payer: BC Managed Care – PPO | Admitting: Physician Assistant

## 2020-08-15 DIAGNOSIS — R059 Cough, unspecified: Secondary | ICD-10-CM

## 2020-08-15 NOTE — Progress Notes (Signed)
I am so sorry that you have been coughing this long.  I know it is miserable.  Given your current symptoms and history I believe your persistent cough is most likely due to your uncontrolled acid reflux.  As you are already being treated for this with Protonix by your PCP, there unfortunately isn't more than we can offer in an e-visit.  I see that your PCP recommended a possible GI consult.  At this time the best course of action is an in person visit with your PCP to ensure there is not another more serious cause of your cough. Please call or message your PCP to set up an appointment.  Based on what you shared with me, I feel your condition warrants further evaluation and I recommend that you be seen for a face to face visit.  Please contact your primary care physician practice to be seen. Many offices offer virtual options to be seen via video if you are not comfortable going in person to a medical facility at this time.  If you do not have a PCP, [facility] offers a free physician referral service available at [phone]. Our trained staff has the experience, knowledge and resources to put you in touch with a physician who is right for you.    If you are having a true medical emergency please call 911.  NOTE: You will NOT be charged for this eVisit.   Your e-visit answers were reviewed by a board certified advanced clinical practitioner to complete your personal care plan.  Thank you for using e-Visits.   Greater than 5 minutes, yet less than 10 minutes of time have been spent researching, coordinating, and implementing care for this patient today

## 2020-08-16 ENCOUNTER — Encounter: Payer: Self-pay | Admitting: Family Medicine

## 2020-08-16 ENCOUNTER — Other Ambulatory Visit: Payer: Self-pay | Admitting: Family Medicine

## 2020-08-19 ENCOUNTER — Ambulatory Visit: Payer: BC Managed Care – PPO | Admitting: Physician Assistant

## 2020-08-19 ENCOUNTER — Other Ambulatory Visit: Payer: Self-pay

## 2020-08-19 ENCOUNTER — Encounter: Payer: Self-pay | Admitting: Physician Assistant

## 2020-08-19 ENCOUNTER — Telehealth: Payer: Self-pay

## 2020-08-19 VITALS — BP 126/89 | HR 86 | Temp 98.2°F | Ht 65.0 in | Wt 258.2 lb

## 2020-08-19 DIAGNOSIS — K219 Gastro-esophageal reflux disease without esophagitis: Secondary | ICD-10-CM

## 2020-08-19 DIAGNOSIS — R059 Cough, unspecified: Secondary | ICD-10-CM | POA: Diagnosis not present

## 2020-08-19 MED ORDER — SUCRALFATE 1 GM/10ML PO SUSP: 1.0000 g | Freq: Three times a day (TID) | ORAL | 0 refills | Status: DC

## 2020-08-19 MED ORDER — SUCRALFATE 1 G PO TABS: 1.0000 g | ORAL_TABLET | Freq: Three times a day (TID) | ORAL | 5 refills | Status: DC

## 2020-08-19 NOTE — Telephone Encounter (Signed)
Pt called that she needs the tablet version of sucralfate because her insurance will not cover the liquid version. The prescription needs to be called in to the [facility] on [address].

## 2020-08-19 NOTE — Progress Notes (Signed)
Acute Office Visit  Subjective:    Patient ID: Sabrina White, female    DOB: [birth year], 35 y.o.   MRN: [id number]  Chief Complaint  Patient presents with   Heartburn    HPI Patient is in today for GERD. Pt says she had an episode last week where she forgot to take her Protonix 40 mg and then she had "severe GERD" for the rest of the night. She has been having some cough and ST since that time, but it does seem to be improving. She wants to know what else she can do for her symptoms. She is taking the Protonix 40 mg daily and Pepcid occasionally. States she has been following a "GERD-diet."   Past Medical History:  Diagnosis Date   Family history of breast cancer in mother 04/05/2017   Mother and maternal grandmother, not genetically tested. Paternal aunt, negative BRCA gene tested.   GERD (gastroesophageal reflux disease)    Hypertension    Hypothyroidism    PCOS (polycystic ovarian syndrome)     Past Surgical History:  Procedure Laterality Date   CESAREAN SECTION N/A 05/06/2019   Procedure: CESAREAN SECTION;  Surgeon: Aloha Gell, MD;  Location: [facility];  Service: Obstetrics;  Laterality: N/A;    Family History  Problem Relation Age of Onset   Breast cancer Mother    Hypertension Father    Breast cancer Maternal Grandmother    Breast cancer Paternal Aunt     Social History   Socioeconomic History   Marital status: Married    Spouse name: Not on file   Number of children: Not on file   Years of education: Not on file   Highest education level: Not on file  Occupational History    Employer: [employer]  Tobacco Use   Smoking status: Never   Smokeless tobacco: Never  Vaping Use   Vaping Use: Never used  Substance and Sexual Activity   Alcohol use: Yes    Comment: occasional   Drug use: No   Sexual activity: Yes  Other Topics Concern   Not on file  Social History Narrative   Not on file   Social Determinants of Health   Financial  Resource Strain: Not on file  Food Insecurity: Not on file  Transportation Needs: Not on file  Physical Activity: Not on file  Stress: Not on file  Social Connections: Not on file  Intimate Partner Violence: Not on file    Outpatient Medications Prior to Visit  Medication Sig Dispense Refill   ALPRAZolam (XANAX) 0.5 MG tablet Take 0.5-1 tablets (0.25-0.5 mg total) by mouth 2 (two) times daily as needed for anxiety (panic attack). 30 tablet 0   amLODipine (NORVASC) 10 MG tablet Take 1 tablet (10 mg total) by mouth daily. 90 tablet 3   Famotidine (PEPCID AC MAXIMUM STRENGTH) 20 MG CHEW Chew 1 tablet by mouth daily as needed (For heartburn.).     hydrochlorothiazide (HYDRODIURIL) 25 MG tablet Take 1 tablet (25 mg total) by mouth daily. 90 tablet 3   pantoprazole (PROTONIX) 40 MG tablet TAKE 1 TABLET(40 MG) BY MOUTH DAILY 30 tablet 3   sertraline (ZOLOFT) 50 MG tablet TAKE 1 TABLET(50 MG) BY MOUTH DAILY 90 tablet 3   No facility-administered medications prior to visit.    Allergies  Allergen Reactions   Codeine Other (See Comments)    Other reaction(s): Dizziness   Nifedipine Nausea And Vomiting and Other (See Comments)    Caused severe  headache.   Raspberry Other (See Comments)    Causes blisters on lips    Review of Systems REFER TO HPI FOR PERTINENT POSITIVES AND NEGATIVES     Objective:    Physical Exam Vitals and nursing note reviewed.  Constitutional:      Appearance: Normal appearance. She is obese.  HENT:     Head: Normocephalic and atraumatic.     Mouth/Throat:     Mouth: Mucous membranes are moist.     Pharynx: No oropharyngeal exudate or posterior oropharyngeal erythema.  Cardiovascular:     Rate and Rhythm: Normal rate and regular rhythm.     Pulses: Normal pulses.     Heart sounds: Normal heart sounds. No murmur heard. Pulmonary:     Effort: Pulmonary effort is normal.     Breath sounds: Normal breath sounds.  Neurological:     Mental Status: She is  alert.    BP 126/89   Pulse 86   Temp 98.2 F (36.8 C)   Ht 5\' 5"  (1.651 m)   Wt 258 lb 3.2 oz (117.1 kg)   SpO2 96%   BMI 42.97 kg/m  Wt Readings from Last 3 Encounters:  08/19/20 258 lb 3.2 oz (117.1 kg)  07/31/20 259 lb 3.2 oz (117.6 kg)  01/31/20 258 lb 12.8 oz (117.4 kg)    Health Maintenance Due  Topic Date Due   TETANUS/TDAP  04/27/2020    There are no preventive care reminders to display for this patient.   Lab Results  Component Value Date   TSH 2.45 01/31/2020   Lab Results  Component Value Date   WBC 8.3 01/31/2020   HGB 14.6 01/31/2020   HCT 43.9 01/31/2020   MCV 85.4 01/31/2020   PLT 269 01/31/2020   Lab Results  Component Value Date   NA 138 01/31/2020   K 3.5 01/31/2020   CO2 31 01/31/2020   GLUCOSE 125 (H) 01/31/2020   BUN 11 01/31/2020   CREATININE 0.97 01/31/2020   BILITOT 0.4 01/31/2020   ALKPHOS 58 07/27/2019   AST 18 01/31/2020   ALT 29 01/31/2020   PROT 6.9 01/31/2020   ALBUMIN 4.0 07/27/2019   CALCIUM 9.0 01/31/2020   ANIONGAP 10 05/09/2019   GFR 84.59 07/27/2019   Lab Results  Component Value Date   CHOL 206 (H) 01/31/2020   Lab Results  Component Value Date   HDL 34 (L) 01/31/2020   Lab Results  Component Value Date   LDLCALC 140 (H) 01/31/2020   Lab Results  Component Value Date   TRIG 188 (H) 01/31/2020   Lab Results  Component Value Date   CHOLHDL 6.1 (H) 01/31/2020   Lab Results  Component Value Date   HGBA1C 5.3 01/31/2020       Assessment & Plan:   Problem List Items Addressed This Visit       Digestive   Gastroesophageal reflux disease without esophagitis - Primary   Relevant Medications   sucralfate (CARAFATE) 1 GM/10ML suspension   Other Relevant Orders   Ambulatory referral to Gastroenterology   Other Visit Diagnoses     Cough in adult       Relevant Orders   Ambulatory referral to Gastroenterology        Meds ordered this encounter  Medications   sucralfate (CARAFATE) 1  GM/10ML suspension    Sig: Take 10 mLs (1 g total) by mouth 4 (four) times daily -  with meals and at bedtime.    Dispense:  420 mL    Refill:  0   1. Gastroesophageal reflux disease without esophagitis 2. Cough in adult Recurrent, resistant GERD, with acute-flare. Will continue the Protonix 40 mg daily. Add sucralfate for added relief and increase Pepcid to twice daily. Referral to GI, as she probably needs endoscopy to r/o Barrett's.    Gotti Alwin M Neomia Herbel, PA-C

## 2020-08-19 NOTE — Telephone Encounter (Signed)
Please see message. °

## 2020-08-19 NOTE — Patient Instructions (Signed)
You should receive a call about the GI referral.  Cont Protonix daily. Increase famotidine to twice daily for the next week. Try the sucralfate for added relief. Let me know if this medication is too expensive and we can send tablets instead.

## 2020-08-26 ENCOUNTER — Telehealth: Payer: BC Managed Care – PPO | Admitting: Physician Assistant

## 2020-08-26 ENCOUNTER — Encounter: Payer: Self-pay | Admitting: Family Medicine

## 2020-08-26 ENCOUNTER — Other Ambulatory Visit: Payer: Self-pay | Admitting: Family Medicine

## 2020-08-26 DIAGNOSIS — J019 Acute sinusitis, unspecified: Secondary | ICD-10-CM

## 2020-08-26 DIAGNOSIS — B9789 Other viral agents as the cause of diseases classified elsewhere: Secondary | ICD-10-CM | POA: Diagnosis not present

## 2020-08-27 MED ORDER — IPRATROPIUM BROMIDE 0.03 % NA SOLN: 2.0000 | Freq: Two times a day (BID) | NASAL | 0 refills | Status: DC

## 2020-08-27 NOTE — Progress Notes (Signed)

## 2020-08-27 NOTE — Progress Notes (Signed)
I have spent 5 minutes in review of e-visit questionnaire, review and updating patient chart, medical decision making and response to patient.   Tu Bayle Cody Othell Jaime, PA-C    

## 2020-09-03 ENCOUNTER — Telehealth: Payer: Self-pay

## 2020-09-03 NOTE — Telephone Encounter (Signed)
.  Type of forms received:surgical letter   Routed to:  Paperwork received by Marilynne Drivers requesting form]: Patient/ [facility]    Individual made aware of 3-5 business day turn around (Y/N):YES   Faxed to :   Form location:  [email or web address] box

## 2020-10-08 ENCOUNTER — Other Ambulatory Visit: Payer: Self-pay | Admitting: Surgery

## 2020-10-08 ENCOUNTER — Other Ambulatory Visit (HOSPITAL_COMMUNITY): Payer: Self-pay | Admitting: Surgery

## 2020-10-14 ENCOUNTER — Other Ambulatory Visit: Payer: Self-pay | Admitting: Family Medicine

## 2020-10-22 ENCOUNTER — Other Ambulatory Visit: Payer: Self-pay

## 2020-10-22 ENCOUNTER — Ambulatory Visit (HOSPITAL_COMMUNITY)
Admission: RE | Admit: 2020-10-22 | Discharge: 2020-10-22 | Disposition: A | Discharge status: Home / Self Care | Payer: BC Managed Care – PPO | Source: Ambulatory Visit | Attending: Surgery | Admitting: Surgery

## 2020-10-24 ENCOUNTER — Telehealth: Payer: BC Managed Care – PPO | Admitting: Physician Assistant

## 2020-10-24 DIAGNOSIS — J020 Streptococcal pharyngitis: Secondary | ICD-10-CM | POA: Diagnosis not present

## 2020-10-24 MED ORDER — AMOXICILLIN 500 MG PO CAPS: 500.0000 mg | ORAL_CAPSULE | Freq: Two times a day (BID) | ORAL | 0 refills | Status: AC

## 2020-10-24 MED ORDER — LIDOCAINE VISCOUS HCL 2 % MT SOLN: 5.0000 mL | OROMUCOSAL | 0 refills | Status: DC | PRN

## 2020-10-24 NOTE — Progress Notes (Signed)
E-Visit for Sore Throat - Strep Symptoms  We are sorry that you are not feeling well.  Here is how we plan to help!  Based on what you have shared with me it is likely that you have strep pharyngitis.  Strep pharyngitis is inflammation and infection in the back of the throat.  This is an infection cause by bacteria and is treated with antibiotics.  I have prescribed Amoxicillin 500 mg twice a day for 10 days and 2% Viscous Lidocaine 5 ml gargle and swallow every 4 hours as needed for throat pain. For throat pain, we recommend over the counter oral pain relief medications such as acetaminophen or aspirin, or anti-inflammatory medications such as ibuprofen or naproxen sodium. Topical treatments such as oral throat lozenges or sprays may be used as needed. Strep infections are not as easily transmitted as other respiratory infections, however we still recommend that you avoid close contact with loved ones, especially the very young and elderly.  Remember to wash your hands thoroughly throughout the day as this is the number one way to prevent the spread of infection and wipe down door knobs and counters with disinfectant.   Home Care: Only take medications as instructed by your medical team. Complete the entire course of an antibiotic. Do not take these medications with alcohol. A steam or ultrasonic humidifier can help congestion.  You can place a towel over your head and breathe in the steam from hot water coming from a faucet. Avoid close contacts especially the very young and the elderly. Cover your mouth when you cough or sneeze. Always remember to wash your hands.  Get Help Right Away If: You develop worsening fever or sinus pain. You develop a severe head ache or visual changes. Your symptoms persist after you have completed your treatment plan.  Make sure you Understand these instructions. Will watch your condition. Will get help right away if you are not doing well or get  worse.   Thank you for choosing an e-visit.  Your e-visit answers were reviewed by a board certified advanced clinical practitioner to complete your personal care plan. Depending upon the condition, your plan could have included both over the counter or prescription medications.  Please review your pharmacy choice. Make sure the pharmacy is open so you can pick up prescription now. If there is a problem, you may contact your provider through [employer] and have the prescription routed to another pharmacy.  Your safety is important to [place]. If you have drug allergies check your prescription carefully.   For the next 24 hours you can use MyChart to ask questions about today's visit, request a non-urgent call back, or ask for a work or school excuse. You will get an email in the next two days asking about your experience. I hope that your e-visit has been valuable and will speed your recovery.  I provided 6 minutes of non face-to-face time during this encounter for chart review and documentation.

## 2020-11-12 ENCOUNTER — Ambulatory Visit: Payer: BC Managed Care – PPO | Admitting: Gastroenterology

## 2020-11-12 ENCOUNTER — Telehealth: Payer: BC Managed Care – PPO | Admitting: Physician Assistant

## 2020-11-12 ENCOUNTER — Encounter: Payer: Self-pay | Admitting: Gastroenterology

## 2020-11-12 VITALS — BP 120/60 | HR 90 | Ht 65.0 in | Wt 256.0 lb

## 2020-11-12 DIAGNOSIS — T3695XA Adverse effect of unspecified systemic antibiotic, initial encounter: Secondary | ICD-10-CM

## 2020-11-12 DIAGNOSIS — K219 Gastro-esophageal reflux disease without esophagitis: Secondary | ICD-10-CM

## 2020-11-12 DIAGNOSIS — R1319 Other dysphagia: Secondary | ICD-10-CM

## 2020-11-12 DIAGNOSIS — B379 Candidiasis, unspecified: Secondary | ICD-10-CM | POA: Diagnosis not present

## 2020-11-12 MED ORDER — FLUCONAZOLE 150 MG PO TABS: 150.0000 mg | ORAL_TABLET | Freq: Once | ORAL | 0 refills | Status: AC

## 2020-11-12 NOTE — Progress Notes (Signed)

## 2020-11-12 NOTE — Progress Notes (Signed)
HPI : Sabrina White is a very pleasant 35 year old female with a history of polycystic ovarian syndrome, hypertension, hypothyroidism who was referred to [place] by Dr. Billey Chang for further evaluation of chronic GERD symptoms.  Patient states that she has had GERD symptoms for over 8 years, and has been on a daily PPI for the past 8 years.  Her GERD symptoms consist of heartburn, acid regurgitation, sometimes nausea and vomiting, coughing and dysphagia.  She took Prilosec for many years until about a year ago when she felt like her symptoms were becoming more frequent and she was switched to Protonix.  She has been taking Protonix daily which controls her symptoms well for the most part, except when she has certain foods/beverages.  On one occasion earlier this year, she forgot to take her Protonix and that night she had horrible symptoms of burning pain in her chest/throat, acid in her mouth and vomiting which awoke her from sleep.  Over the next two weeks she continued to have a very bothersome hacking cough which finally subsided. She is being evaluated for gastric bypass surgery with a potential surgery later this year.  She underwent a barium swallow recently which showed a hiatal hernia and nonobstructive Schatzki ring. She denies any chronic lower GI symptoms to include abdominal pain, diarrhea, constipation or blood in her stool. She has no family history of malignancy of the GI tract.    Past Medical History:  Diagnosis Date   Family history of breast cancer in mother 04/05/2017   Mother and maternal grandmother, not genetically tested. Paternal aunt, negative BRCA gene tested.   GERD (gastroesophageal reflux disease)    Hypertension    Hypothyroidism    PCOS (polycystic ovarian syndrome)      Past Surgical History:  Procedure Laterality Date   CESAREAN SECTION N/A 05/06/2019   Procedure: CESAREAN SECTION;  Surgeon: Aloha Gell, MD;  Location: [facility];  Service: Obstetrics;   Laterality: N/A;   Family History  Problem Relation Age of Onset   Breast cancer Mother    Hypertension Father    Breast cancer Paternal Aunt    Breast cancer Maternal Grandmother    Colon cancer Neg Hx    Pancreatic cancer Neg Hx    Esophageal cancer Neg Hx    Liver cancer Neg Hx    Stomach cancer Neg Hx    Colon polyps Neg Hx    Social History   Tobacco Use   Smoking status: Never   Smokeless tobacco: Never  Vaping Use   Vaping Use: Never used  Substance Use Topics   Alcohol use: Yes    Comment: occasional   Drug use: No   Current Outpatient Medications  Medication Sig Dispense Refill   ALPRAZolam (XANAX) 0.5 MG tablet Take 0.5-1 tablets (0.25-0.5 mg total) by mouth 2 (two) times daily as needed for anxiety (panic attack). 30 tablet 0   amLODipine (NORVASC) 10 MG tablet TAKE 1 TABLET(10 MG) BY MOUTH DAILY 90 tablet 3   Famotidine (PEPCID AC MAXIMUM STRENGTH) 20 MG CHEW Chew 1 tablet by mouth daily as needed (For heartburn.).     hydrochlorothiazide (HYDRODIURIL) 25 MG tablet TAKE 1 TABLET(25 MG) BY MOUTH DAILY 90 tablet 3   pantoprazole (PROTONIX) 40 MG tablet TAKE 1 TABLET(40 MG) BY MOUTH DAILY 30 tablet 3   sertraline (ZOLOFT) 50 MG tablet TAKE 1 TABLET(50 MG) BY MOUTH DAILY 90 tablet 3   sucralfate (CARAFATE) 1 g tablet Take 1 tablet (1 g  total) by mouth 4 (four) times daily -  with meals and at bedtime. 120 tablet 5   fluconazole (DIFLUCAN) 150 MG tablet Take 1 tablet (150 mg total) by mouth once for 1 dose. 1 tablet 0   No current facility-administered medications for this visit.   Allergies  Allergen Reactions   Codeine Other (See Comments)    Other reaction(s): Dizziness   Nifedipine Nausea And Vomiting and Other (See Comments)    Caused severe headache.   Raspberry Other (See Comments)    Causes blisters on lips     Review of Systems: All systems reviewed and negative except where noted in HPI.    DG Chest 2 View  Result Date: 10/22/2020 CLINICAL  DATA:  Morbid obesity, preoperative for bariatric surgery. EXAM: CHEST - 2 VIEW COMPARISON:  None. FINDINGS: There is 27 degrees of dextroconvex thoracic scoliosis as measured between T5 and T9. Mild midthoracic spondylosis. Cardiac and mediastinal margins appear normal. The lungs appear clear. No blunting of the costophrenic angles. IMPRESSION: 1. Dextroconvex thoracic scoliosis and mild midthoracic spondylosis. Otherwise unremarkable. Electronically Signed   By: Van Clines M.D.   On: 10/22/2020 09:58   DG UGI W SINGLE CM (SOL OR THIN BA)  Result Date: 10/22/2020 CLINICAL DATA:  Preoperative for bariatric surgery, morbid obesity. EXAM: UPPER GI SERIES WITH KUB TECHNIQUE: After obtaining a scout radiograph a routine upper GI series was performed using thin barium FLUOROSCOPY TIME:  Fluoroscopy Time:  2 minutes, 42 seconds Radiation Exposure Index (if provided by the fluoroscopic device): 59.2 mGy Number of Acquired Spot Images: For COMPARISON:  None. FINDINGS: The initial KUB demonstrates dextroconvex thoracic and mild levoconvex lumbar scoliosis. Unremarkable bowel gas pattern in the upper abdomen. The pharyngeal phase of swallowing appears normal. Primary peristaltic waves in the esophagus are normal on 4/4 swallows. Distal esophageal mucosal ring 2.1 cm in diameter. Schatzki rings of this diameter rarely cause symptoms. Small to moderate-sized type 1 hiatal hernia as shown for example on image 31 series 12. No esophageal stricture or ulceration identified. Aside from the hiatal hernia, gastric morphology is normal. Mucosal relief compression fluoroscopy was performed. The duodenal bulb in the remainder of the duodenum demonstrate normal appearance and configuration as shown for example on image 10 series 17. A 13 mm barium tablet passed briskly down into the region of the hiatal hernia. IMPRESSION: 1. Small to moderate-sized type 1 hiatal hernia. 2. Widely patent (approximately 2.1 cm in diameter)  distal esophageal mucosal ring. Unlikely to be causing symptoms given this diameter. 3. There is a moderate degree of dextroconvex thoracic scoliosis and mild levoconvex lumbar scoliosis. Electronically Signed   By: Van Clines M.D.   On: 10/22/2020 09:56    Physical Exam: BP 120/60   Pulse 90   Ht $R'5\' 5"'ip$  (1.651 m)   Wt 256 lb (116.1 kg)   SpO2 98%   BMI 42.60 kg/m  Constitutional: Pleasant,well-developed, Caucasian female in no acute distress. HEENT: Normocephalic and atraumatic. Conjunctivae are normal. No scleral icterus. Cardiovascular: Normal rate, regular rhythm.  Pulmonary/chest: Effort normal and breath sounds normal. No wheezing, rales or rhonchi. Abdominal: Soft, nondistended, nontender. Bowel sounds active throughout. There are no masses palpable. No hepatomegaly. Extremities: no edema Neurological: Alert and oriented to person place and time. Skin: Skin is warm and dry. No rashes noted. Psychiatric: Normal mood and affect. Behavior is normal.  CBC    Component Value Date/Time   WBC 8.3 01/31/2020 0832   RBC 5.14 (H) 01/31/2020 4431  HGB 14.6 01/31/2020 0832   HCT 43.9 01/31/2020 0832   PLT 269 01/31/2020 0832   MCV 85.4 01/31/2020 0832   MCH 28.4 01/31/2020 0832   MCHC 33.3 01/31/2020 0832   RDW 13.0 01/31/2020 0832   LYMPHSABS 2,332 01/31/2020 0832   MONOABS 0.3 07/27/2019 0917   EOSABS 656 (H) 01/31/2020 0832   BASOSABS 66 01/31/2020 0832    CMP     Component Value Date/Time   NA 138 01/31/2020 0832   K 3.5 01/31/2020 0832   CL 99 01/31/2020 0832   CO2 31 01/31/2020 0832   GLUCOSE 125 (H) 01/31/2020 0832   BUN 11 01/31/2020 0832   CREATININE 0.97 01/31/2020 0832   CALCIUM 9.0 01/31/2020 0832   PROT 6.9 01/31/2020 0832   ALBUMIN 4.0 07/27/2019 0917   AST 18 01/31/2020 0832   ALT 29 01/31/2020 0832   ALKPHOS 58 07/27/2019 0917   BILITOT 0.4 01/31/2020 0832   GFRNONAA 76 01/31/2020 0832   GFRAA 88 01/31/2020 0832     ASSESSMENT AND  PLAN: 35 year old female with longstanding typical GERD symptoms, which are mostly well controlled with once daily Protonix, with breakthrough symptoms typically predicted by trigger foods or beverages.  She has been considered for bariatric surgery and an upper endoscopy has been requested as part of her evaluation.  Hopefully, her the bariatric surgery will also greatly improve her GERD symptoms, and she will not require chronic PPI therapy following the surgery.  In the meantime, I recommend continuing which she is doing, with once daily Protonix and continuing lifestyle modifications to limit GERD symptoms.  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  We will plan to take esophageal biopsies to evaluate for eosinophilic esophagitis, especially given her history of dysphagia which improves with PPI use.  GERD - EGD - Continue Protonix 40 mg daily  Dysphagia improved with PPI/eosinophilia - Plan for esophageal biopsies during EGD  The details, risks (including bleeding, perforation, infection, missed lesions, medication reactions and possible hospitalization or surgery if complications occur), benefits, and alternatives to EGD with possible biopsy and possible dilation were discussed with the patient and she consents to proceed.   Chris Narasimhan E. Candis Schatz, [facility]   Leamon Arnt, MD

## 2020-11-12 NOTE — Progress Notes (Signed)
I have spent 5 minutes in review of e-visit questionnaire, review and updating patient chart, medical decision making and response to patient.   Calhoun Reichardt Cody Seletha Zimmermann, PA-C    

## 2020-11-12 NOTE — Patient Instructions (Signed)
If you are age 35 or older, your body mass index should be between 23-30. Your Body mass index is 42.6 kg/m. If this is out of the aforementioned range listed, please consider follow up with your Primary Care Provider.  If you are age 30 or younger, your body mass index should be between 19-25. Your Body mass index is 42.6 kg/m. If this is out of the aformentioned range listed, please consider follow up with your Primary Care Provider.   You have been scheduled for an endoscopy. Please follow written instructions given to you at your visit today. If you use inhalers (even only as needed), please bring them with you on the day of your procedure.   The  GI providers would like to encourage you to use [facility] to communicate with providers for non-urgent requests or questions.  Due to long hold times on the telephone, sending your provider a message by [facility] may be a faster and more efficient way to get a response.  Please allow 48 business hours for a response.  Please remember that this is for non-urgent requests.   It was a pleasure to see you today!  Thank you for trusting me with your gastrointestinal care!    Scott E. Tomasa Rand, MD

## 2020-11-13 ENCOUNTER — Encounter: Payer: Self-pay | Admitting: Gastroenterology

## 2020-11-14 ENCOUNTER — Other Ambulatory Visit: Payer: Self-pay | Admitting: Family Medicine

## 2020-11-18 ENCOUNTER — Other Ambulatory Visit: Payer: Self-pay

## 2020-11-18 ENCOUNTER — Encounter: Payer: Self-pay | Admitting: Skilled Nursing Facility1

## 2020-11-18 ENCOUNTER — Encounter: Payer: BC Managed Care – PPO | Attending: Surgery | Admitting: Skilled Nursing Facility1

## 2020-11-18 NOTE — Progress Notes (Signed)
Nutrition Assessment for Bariatric Surgery Medical Nutrition Therapy Appt Start Time: 7:20    End Time: 8:20  Patient was seen on 11/18/2020 for Pre-Operative Nutrition Assessment. Letter of approval faxed to [facility] bariatric surgery program coordinator on 09/26/202.   Referral stated Supervised Weight Loss (SWL) visits needed: 0  Pt completed visits.   Pt has cleared nutrition requirements.   Planned surgery: RYGB Pt expectation of surgery: "to maintain this level of activity for many more decades" Pt expectation of dietitian: none identified     NUTRITION ASSESSMENT   Anthropometrics  Start weight at [facility]: 266.4 lbs (date: 11/18/2020)  Height: 65 in BMI: 44.33 kg/m2     Clinical  Medical hx: GERD, PCOS, HTN Medications: see list  Labs: cholesterol 208, HDL 32, triglycerides 329, A1C 5.7, vitamin D 29.1 Notable signs/symptoms: N/A Any previous deficiencies? 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  Pt states she currently plays softball weekly with a league and rides the [place] 3-4 days a week.  Pt states she is caffeine sensitive.   24-Hr Dietary Recall First Meal: cereal Snack: boiled egg Second Meal: [place] sandwich or salad + fruit Snack: fruit or cheese Third Meal: pizza + salad or chicken pie + broccoli Snack: fruit Beverages: coffee + creamer, water   Estimated Energy Needs Calories: 1800   NUTRITION DIAGNOSIS  Overweight/obesity ([place]-3.3) related to past poor dietary habits and physical inactivity as evidenced by patient w/ planned RYGB surgery following dietary guidelines for continued weight loss.    NUTRITION INTERVENTION  Nutrition counseling  (C-1) and education (E-2) to facilitate bariatric surgery goals.  Educated pt on micronutrient deficiencies post surgery and strategies to mitigate that risk   Pre-Op Goals Reviewed with the Patient Track food and beverage intake (pen and paper, MyFitness Pal, Baritastic app, etc.) Make healthy food choices while monitoring portion sizes Consume 3 meals per day or try to eat every 3-5 hours Avoid concentrated sugars and fried foods Keep sugar & fat in the single digits per serving on food labels Practice CHEWING your food (aim for applesauce consistency) Practice not drinking 15 minutes before, during, and 30 minutes after each meal and snack Avoid all carbonated beverages (ex: soda, sparkling beverages)  Limit caffeinated beverages (ex: coffee, tea, energy drinks) Avoid all sugar-sweetened beverages (ex: regular soda, sports drinks)  Avoid alcohol  Aim for 64-100 ounces of FLUID daily (with at least half of fluid intake being plain water)  Aim for at least 60-80 grams of PROTEIN daily Look for a liquid protein source that contains ?15 g protein and ?5 g carbohydrate (ex: shakes, drinks, shots) Make a list of non-food related activities Physical activity is an important part of a healthy lifestyle so keep it moving! The goal is to reach 150 minutes of exercise per week, including cardiovascular and weight baring activity.  *Goals that are bolded indicate the pt would like to start working towards these  Handouts Provided Include  Bariatric Surgery handouts (Nutrition Visits, Pre-Op Goals, Protein Shakes, Vitamins & Minerals)  Learning Style & Readiness for Change Teaching method utilized: Visual & Auditory  Demonstrated degree of understanding via: Teach Back  Readiness Level: Action Barriers to learning/adherence to lifestyle change:  none identified      MONITORING & EVALUATION Dietary intake, weekly physical activity, body weight, and pre-op goals reached at next nutrition visit.     Next Steps  Patient is to follow up at [facility] for Pre-Op Class >2 weeks before surgery for further nutrition education.  Pt has completed visits. No further supervised visits required/recomended

## 2020-11-19 ENCOUNTER — Encounter: Payer: Self-pay | Admitting: Gastroenterology

## 2020-11-19 ENCOUNTER — Ambulatory Visit (AMBULATORY_SURGERY_CENTER): Payer: BC Managed Care – PPO | Admitting: Gastroenterology

## 2020-11-19 VITALS — BP 142/94 | HR 69 | Temp 98.4°F | Resp 17 | Ht 65.0 in | Wt 256.0 lb

## 2020-11-19 DIAGNOSIS — K449 Diaphragmatic hernia without obstruction or gangrene: Secondary | ICD-10-CM

## 2020-11-19 DIAGNOSIS — K2970 Gastritis, unspecified, without bleeding: Secondary | ICD-10-CM | POA: Diagnosis not present

## 2020-11-19 DIAGNOSIS — R1319 Other dysphagia: Secondary | ICD-10-CM | POA: Diagnosis present

## 2020-11-19 DIAGNOSIS — K2990 Gastroduodenitis, unspecified, without bleeding: Secondary | ICD-10-CM

## 2020-11-19 DIAGNOSIS — K317 Polyp of stomach and duodenum: Secondary | ICD-10-CM

## 2020-11-19 DIAGNOSIS — K219 Gastro-esophageal reflux disease without esophagitis: Secondary | ICD-10-CM

## 2020-11-19 DIAGNOSIS — K2950 Unspecified chronic gastritis without bleeding: Secondary | ICD-10-CM | POA: Diagnosis not present

## 2020-11-19 HISTORY — PX: ESOPHAGOGASTRODUODENOSCOPY ENDOSCOPY: SHX5814

## 2020-11-19 MED ORDER — SODIUM CHLORIDE 0.9 % IV SOLN: 500.0000 mL | Freq: Once | INTRAVENOUS | Status: DC

## 2020-11-19 NOTE — Progress Notes (Signed)
VS-CW 

## 2020-11-19 NOTE — Progress Notes (Signed)
1516 Robinul 0.1 mg IV given due large amount of secretions upon assessment.  MD made aware, vss  °

## 2020-11-19 NOTE — Progress Notes (Signed)
History and Physical Interval Note:  No changes in patient's symptoms or medical history since her clinic visit Sept 20th.  11/19/2020 3:16 PM  Sabrina White  has presented today for endoscopic procedure(s), with the diagnosis of  Encounter Diagnoses  Name Primary?   Esophageal dysphagia Yes   Gastroesophageal reflux disease, unspecified whether esophagitis present   .  The various methods of evaluation and treatment have been discussed with the patient and/or family. After consideration of risks, benefits and other options for treatment, the patient has consented to  the endoscopic procedure(s).   The patient's history has been reviewed, patient examined, no change in status, stable for endoscopic procedure(s).  I have reviewed the patient's chart and labs.  Questions were answered to the patient's satisfaction.     Hollynn Garno E. Tomasa Rand, MD [facility]

## 2020-11-19 NOTE — Patient Instructions (Signed)
Resume previous diet and continue present medications. Awaiting pathology results.  Follow up with Dr. Tomasa Rand as needed in the GI clinic YOU HAD AN ENDOSCOPIC PROCEDURE TODAY AT THE [facility]:   Refer to the procedure report that was given to you for any specific questions about what was found during the examination.  If the procedure report does not answer your questions, please call your gastroenterologist to clarify.  If you requested that your care partner not be given the details of your procedure findings, then the procedure report has been included in a sealed envelope for you to review at your convenience later.  YOU SHOULD EXPECT: Some feelings of bloating in the abdomen. Passage of more gas than usual.  Walking can help get rid of the air that was put into your GI tract during the procedure and reduce the bloating. If you had a lower endoscopy (such as a colonoscopy or flexible sigmoidoscopy) you may notice spotting of blood in your stool or on the toilet paper. If you underwent a bowel prep for your procedure, you may not have a normal bowel movement for a few days.  Please Note:  You might notice some irritation and congestion in your nose or some drainage.  This is from the oxygen used during your procedure.  There is no need for concern and it should clear up in a day or so.  SYMPTOMS TO REPORT IMMEDIATELY:  Following upper endoscopy (EGD)  Vomiting of blood or coffee ground material  New chest pain or pain under the shoulder blades  Painful or persistently difficult swallowing  New shortness of breath  Fever of 100F or higher  Black, tarry-looking stools  For urgent or emergent issues, a gastroenterologist can be reached at any hour by calling [phone]. Do not use MyChart messaging for urgent concerns.    DIET:  We do recommend a small meal at first, but then you may proceed to your regular diet.  Drink plenty of fluids but you should avoid alcoholic  beverages for 24 hours.  ACTIVITY:  You should plan to take it easy for the rest of today and you should NOT DRIVE or use heavy machinery until tomorrow (because of the sedation medicines used during the test).    FOLLOW UP: Our staff will call the number listed on your records 48-72 hours following your procedure to check on you and address any questions or concerns that you may have regarding the information given to you following your procedure. If we do not reach you, we will leave a message.  We will attempt to reach you two times.  During this call, we will ask if you have developed any symptoms of COVID 19. If you develop any symptoms (ie: fever, flu-like symptoms, shortness of breath, cough etc.) before then, please call [phone].  If you test positive for Covid 19 in the 2 weeks post procedure, please call and report this information to [place].    If any biopsies were taken you will be contacted by phone or by letter within the next 1-3 weeks.  Please call us at [phone] if you have not heard about the biopsies in 3 weeks.    SIGNATURES/CONFIDENTIALITY: You and/or your care partner have signed paperwork which will be entered into your electronic medical record.  These signatures attest to the fact that that the information above on your After Visit Summary has been reviewed and is understood.  Full responsibility of the confidentiality of this discharge  information lies with you and/or your care-partner.

## 2020-11-19 NOTE — Progress Notes (Signed)
Report given to PACU, vss 

## 2020-11-19 NOTE — Op Note (Signed)
[facility] Patient Name: Sabrina White Procedure Date: 11/19/2020 3:08 PM MRN: [id number] Endoscopist: Lorin Picket E. Tomasa Rand , MD Age: 35 Referring MD:  Date of Birth: 03/03/1985 Gender: Female Account #: [id number] Procedure:                Upper GI endoscopy Indications:              Dysphagia, Heartburn Medicines:                Monitored Anesthesia Care Procedure:                Pre-Anesthesia Assessment:                           - Prior to the procedure, a History and Physical                            was performed, and patient medications and                            allergies were reviewed. The patient's tolerance of                            previous anesthesia was also reviewed. The risks                            and benefits of the procedure and the sedation                            options and risks were discussed with the patient.                            All questions were answered, and informed consent                            was obtained. Prior Anticoagulants: The patient has                            taken no previous anticoagulant or antiplatelet                            agents. ASA Grade Assessment: III - A patient with                            severe systemic disease. After reviewing the risks                            and benefits, the patient was deemed in                            satisfactory condition to undergo the procedure.                           After obtaining informed consent, the endoscope was  passed under direct vision. Throughout the                            procedure, the patient's blood pressure, pulse, and                            oxygen saturations were monitored continuously. The                            Endoscope was introduced through the mouth, and                            advanced to the third part of duodenum. The upper                            GI endoscopy was accomplished  without difficulty.                            The patient tolerated the procedure. Scope In: Scope Out: Findings:                 The examined portions of the nasopharynx,                            oropharynx and larynx were normal.                           The examined esophagus was normal. Biopsies were                            obtained from the proximal and distal esophagus                            with cold forceps for histology of suspected                            eosinophilic esophagitis. Estimated blood loss was                            minimal.                           Localized moderate inflammation characterized by                            erythema was found in the gastric body. Biopsies                            were taken with a cold forceps for Helicobacter                            pylori testing. Estimated blood loss was minimal.                           Multiple 2 to 8 mm sessile polyps were found  in the                            gastric body. These polyps were removed with a cold                            snare. Resection and retrieval were complete.                            Estimated blood loss was minimal.                           A 4 cm hiatal hernia was present.                           The exam of the stomach was otherwise normal.                           The examined duodenum was normal. Complications:            No immediate complications. Estimated Blood Loss:     Estimated blood loss was minimal. Impression:               - The examined portions of the nasopharynx,                            oropharynx and larynx were normal.                           - Normal esophagus. Biopsied.                           - Gastritis. Biopsied.                           - Multiple gastric polyps. Resected and retrieved.                            These appeared consistent with fundic gland polyps.                           - 4 cm hiatal hernia.                            - Normal examined duodenum. Recommendation:           - Patient has a contact number available for                            emergencies. The signs and symptoms of potential                            delayed complications were discussed with the                            patient. Return to normal activities tomorrow.  Written discharge instructions were provided to the                            patient.                           - Resume previous diet.                           - Continue present medications.                           - Await pathology results.                           - Follow up with Dr. Tomasa Rand as needed in the GI                            clinic. Zhane Donlan E. Tomasa Rand, MD 11/19/2020 3:37:35 PM This report has been signed electronically.

## 2020-11-19 NOTE — Progress Notes (Signed)
Called to room to assist during endoscopic procedure.  Patient ID and intended procedure confirmed with present staff. Received instructions for my participation in the procedure from the performing physician.  

## 2020-11-20 ENCOUNTER — Telehealth (INDEPENDENT_AMBULATORY_CARE_PROVIDER_SITE_OTHER): Payer: BC Managed Care – PPO | Admitting: Family

## 2020-11-20 DIAGNOSIS — J029 Acute pharyngitis, unspecified: Secondary | ICD-10-CM | POA: Diagnosis not present

## 2020-11-20 MED ORDER — LIDOCAINE VISCOUS HCL 2 % MT SOLN: 15.0000 mL | OROMUCOSAL | 0 refills | Status: DC | PRN

## 2020-11-20 MED ORDER — AMOXICILLIN-POT CLAVULANATE 875-125 MG PO TABS: 1.0000 | ORAL_TABLET | Freq: Two times a day (BID) | ORAL | 0 refills | Status: DC

## 2020-11-20 MED ORDER — FLUCONAZOLE 150 MG PO TABS: ORAL_TABLET | ORAL | 0 refills | Status: DC

## 2020-11-20 NOTE — Progress Notes (Signed)
MyChart Video Visit    Virtual Visit via Video Note   This visit type was conducted due to national recommendations for restrictions regarding the COVID-19 Pandemic (e.g. social distancing) in an effort to limit this patient's exposure and mitigate transmission in our community. This patient is at least at moderate risk for complications without adequate follow up. This format is felt to be most appropriate for this patient at this time. Physical exam was limited by quality of the video and audio technology used for the visit. CMA was able to get the patient set up on a video visit.  Patient location: Home Patient and provider in visit Provider location: Office  I discussed the limitations of evaluation and management by telemedicine and the availability of in person appointments. The patient expressed understanding and agreed to proceed.  Visit Date: 11/20/2020  Today's healthcare provider: Lemont Fillers, NP     Subjective:    Patient ID: Sabrina White, female    DOB: 1985/04/15, 35 y.o.   MRN: [id number]  Chief Complaint  Patient presents with   Sore Throat    Complains of sore throat, home covid test today was negative. Was treated for strep pharyngitis 9-01.     HPI Patient is in today for a video visit. She complains of sore pain since 11/16/2020. She also had a fever of 101 degrees F that day. She reports having white patches on her tonsils and both tonsils are equal size. She reports the lymph nodes are slightly swollen in her neck. She notes that she recently had an endoscopy and thinks it maybe swollen due to that. She is taking OTC ibuprofen PRN to manage her pain. She also reports having strept throat on 10/24/2020 and was given anti-biotics and later developed a yeast infection shortly after completing her anti-biotic course. She was also given lidocaine to manage her pain and found temporary relief while using it.    Past Medical History:  Diagnosis Date    Family history of breast cancer in mother 04/05/2017   Mother and maternal grandmother, not genetically tested. Paternal aunt, negative BRCA gene tested.   GERD (gastroesophageal reflux disease)    Hypertension    Hypothyroidism    PCOS (polycystic ovarian syndrome)     Past Surgical History:  Procedure Laterality Date   CESAREAN SECTION N/A 05/06/2019   Procedure: CESAREAN SECTION;  Surgeon: Noland Fordyce, MD;  Location: [facility] ORS;  Service: Obstetrics;  Laterality: N/A;    Family History  Problem Relation Age of Onset   Breast cancer Mother    Hypertension Father    Breast cancer Paternal Aunt    Breast cancer Maternal Grandmother    Colon cancer Neg Hx    Pancreatic cancer Neg Hx    Esophageal cancer Neg Hx    Liver cancer Neg Hx    Stomach cancer Neg Hx    Colon polyps Neg Hx     Social History   Socioeconomic History   Marital status: Married    Spouse name: Not on file   Number of children: Not on file   Years of education: Not on file   Highest education level: Not on file  Occupational History    Employer: [employer]  Tobacco Use   Smoking status: Never   Smokeless tobacco: Never  Vaping Use   Vaping Use: Never used  Substance and Sexual Activity   Alcohol use: Yes    Comment: occasional   Drug use:  No   Sexual activity: Yes  Other Topics Concern   Not on file  Social History Narrative   Not on file   Social Determinants of Health   Financial Resource Strain: Not on file  Food Insecurity: Not on file  Transportation Needs: Not on file  Physical Activity: Not on file  Stress: Not on file  Social Connections: Not on file  Intimate Partner Violence: Not on file    Outpatient Medications Prior to Visit  Medication Sig Dispense Refill   ALPRAZolam (XANAX) 0.5 MG tablet Take 0.5-1 tablets (0.25-0.5 mg total) by mouth 2 (two) times daily as needed for anxiety (panic attack). 30 tablet 0   amLODipine (NORVASC) 10 MG tablet TAKE 1  TABLET(10 MG) BY MOUTH DAILY 90 tablet 3   Famotidine (PEPCID AC MAXIMUM STRENGTH) 20 MG CHEW Chew 1 tablet by mouth daily as needed (For heartburn.).     hydrochlorothiazide (HYDRODIURIL) 25 MG tablet TAKE 1 TABLET(25 MG) BY MOUTH DAILY 90 tablet 3   pantoprazole (PROTONIX) 40 MG tablet TAKE 1 TABLET(40 MG) BY MOUTH DAILY 30 tablet 3   sertraline (ZOLOFT) 50 MG tablet TAKE 1 TABLET(50 MG) BY MOUTH DAILY 90 tablet 3   sucralfate (CARAFATE) 1 g tablet Take 1 tablet (1 g total) by mouth 4 (four) times daily -  with meals and at bedtime. 120 tablet 5   fluconazole (DIFLUCAN) 150 MG tablet Take 150 mg by mouth once.     Facility-Administered Medications Prior to Visit  Medication Dose Route Frequency Provider Last Rate Last Admin   0.9 %  sodium chloride infusion  500 mL Intravenous Once Daryel November, MD        Allergies  Allergen Reactions   Codeine Other (See Comments)    Other reaction(s): Dizziness   Nifedipine Nausea And Vomiting and Other (See Comments)    Caused severe headache.   Raspberry Other (See Comments)    Causes blisters on lips    Review of Systems  Constitutional:        (+)swollen lymph nodes  HENT:  Positive for sore throat.        (+)white patches on tonsils      Objective:    Physical Exam Constitutional:      General: She is not in acute distress.    Appearance: Normal appearance. She is not ill-appearing.  Pulmonary:     Effort: Pulmonary effort is normal.  Neurological:     Mental Status: She is alert.  Psychiatric:        Behavior: Behavior normal.        Judgment: Judgment normal.    There were no vitals taken for this visit. Wt Readings from Last 3 Encounters:  11/19/20 256 lb (116.1 kg)  11/18/20 266 lb 6.4 oz (120.8 kg)  11/12/20 256 lb (116.1 kg)    Diabetic Foot Exam - Simple   No data filed    Lab Results  Component Value Date   WBC 8.3 01/31/2020   HGB 14.6 01/31/2020   HCT 43.9 01/31/2020   PLT 269 01/31/2020    GLUCOSE 125 (H) 01/31/2020   CHOL 206 (H) 01/31/2020   TRIG 188 (H) 01/31/2020   HDL 34 (L) 01/31/2020   LDLDIRECT 117.0 04/05/2017   LDLCALC 140 (H) 01/31/2020   ALT 29 01/31/2020   AST 18 01/31/2020   NA 138 01/31/2020   K 3.5 01/31/2020   CL 99 01/31/2020   CREATININE 0.97 01/31/2020   BUN 11 01/31/2020  CO2 31 01/31/2020   TSH 2.45 01/31/2020   HGBA1C 5.3 01/31/2020    Lab Results  Component Value Date   TSH 2.45 01/31/2020   Lab Results  Component Value Date   WBC 8.3 01/31/2020   HGB 14.6 01/31/2020   HCT 43.9 01/31/2020   MCV 85.4 01/31/2020   PLT 269 01/31/2020   Lab Results  Component Value Date   NA 138 01/31/2020   K 3.5 01/31/2020   CO2 31 01/31/2020   GLUCOSE 125 (H) 01/31/2020   BUN 11 01/31/2020   CREATININE 0.97 01/31/2020   BILITOT 0.4 01/31/2020   ALKPHOS 58 07/27/2019   AST 18 01/31/2020   ALT 29 01/31/2020   PROT 6.9 01/31/2020   ALBUMIN 4.0 07/27/2019   CALCIUM 9.0 01/31/2020   ANIONGAP 10 05/09/2019   GFR 84.59 07/27/2019   Lab Results  Component Value Date   CHOL 206 (H) 01/31/2020   Lab Results  Component Value Date   HDL 34 (L) 01/31/2020   Lab Results  Component Value Date   LDLCALC 140 (H) 01/31/2020   Lab Results  Component Value Date   TRIG 188 (H) 01/31/2020   Lab Results  Component Value Date   CHOLHDL 6.1 (H) 01/31/2020   Lab Results  Component Value Date   HGBA1C 5.3 01/31/2020       Assessment & Plan:   Problem List Items Addressed This Visit       Unprioritized   Pharyngitis - Primary    Presumed strep. Will rx with augmentin (previously treated with amoxicillin).  She is also given an rx for diflucan in case of yeast infection and an rx for viscous lidocaine for pain.  She is advised to go to the ER if severe/worsening symptoms or if she becomes unable to eat/drink. She is advised to seek in person evaluation if symptoms fail to improve.         Meds ordered this encounter  Medications    amoxicillin-clavulanate (AUGMENTIN) 875-125 MG tablet    Sig: Take 1 tablet by mouth 2 (two) times daily.    Dispense:  20 tablet    Refill:  0    Order Specific Question:   Supervising Provider    Answer:   Penni Homans A [[id number]]   fluconazole (DIFLUCAN) 150 MG tablet    Sig: Take 1 tablet by mouth at start of yeast infection, may repeat in 3 days if needed    Dispense:  2 tablet    Refill:  0    Order Specific Question:   Supervising Provider    Answer:   Penni Homans A [[id number]]   lidocaine (XYLOCAINE) 2 % solution    Sig: Use as directed 15 mLs in the mouth or throat every 4 (four) hours as needed for mouth pain.    Dispense:  200 mL    Refill:  0    Order Specific Question:   Supervising Provider    Answer:   Penni Homans A [[id number]]    I discussed the assessment and treatment plan with the patient. The patient was provided an opportunity to ask questions and all were answered. The patient agreed with the plan and demonstrated an understanding of the instructions.   The patient was advised to call back or seek an in-person evaluation if the symptoms worsen or if the condition fails to improve as anticipated.  I,Shehryar Baig,acting as a Education administrator for [employer], NP.,have documented all relevant documentation on the behalf of  Sabrina Pear, NP,as directed by  Sabrina Pear, NP while in the presence of Sabrina Pear, NP.  I provided 20 minutes of face-to-face time during this encounter.   Sabrina Pear, NP [employer] [facility] [employer] [phone] (phone) [phone] (fax)  [place]

## 2020-11-21 ENCOUNTER — Telehealth: Payer: Self-pay

## 2020-11-21 DIAGNOSIS — J029 Acute pharyngitis, unspecified: Secondary | ICD-10-CM | POA: Insufficient documentation

## 2020-11-21 NOTE — Assessment & Plan Note (Signed)
Presumed strep. Will rx with augmentin (previously treated with amoxicillin).  She is also given an rx for diflucan in case of yeast infection and an rx for viscous lidocaine for pain.  She is advised to go to the ER if severe/worsening symptoms or if she becomes unable to eat/drink. She is advised to seek in person evaluation if symptoms fail to improve.

## 2020-11-21 NOTE — Telephone Encounter (Signed)
  Follow up Call-  Call back number 11/19/2020  Post procedure Call Back phone  # [phone]  Permission to leave phone message Yes  Some recent data might be hidden     Patient questions:  Do you have a fever, pain , or abdominal swelling? No. Pain Score  0 *  Have you tolerated food without any problems? Yes.    Have you been able to return to your normal activities? Yes.    Do you have any questions about your discharge instructions: Diet   No. Medications  No. Follow up visit  No.  Do you have questions or concerns about your Care? No.  Actions: * If pain score is 4 or above: No action needed, pain <4.

## 2020-11-29 ENCOUNTER — Encounter: Payer: Self-pay | Admitting: Gastroenterology

## 2020-12-09 ENCOUNTER — Encounter: Payer: BC Managed Care – PPO | Attending: Surgery | Admitting: Skilled Nursing Facility1

## 2020-12-16 ENCOUNTER — Other Ambulatory Visit: Payer: Self-pay

## 2020-12-16 ENCOUNTER — Encounter: Payer: BC Managed Care – PPO | Admitting: Skilled Nursing Facility1

## 2020-12-16 NOTE — Progress Notes (Signed)
Pre-Operative Nutrition Class:    Patient was seen on 12/16/2020 for Pre-Operative Bariatric Surgery Education at the [facility].    Surgery date: 01/13/2021 Surgery type: RYGB Start weight at [facility]: 266.4 Weight today: 264 pounds  Samples given per MNT protocol. Patient educated on appropriate usage:  Ensure max exp: April 23, 2021 Ensure max lot: [id number]  Chewable bariatric advantage: advanced multi EA exp: 08/23 Chewable bariatric advantage: advanced multi EA lot: [id number]  Bariatric advantage calcium citrate exp: 02/23 Bariatric advantage calcium citrate lot: [id number]   The following the learning objectives were met by the patient during this course: Identify Pre-Op Dietary Goals and will begin 2 weeks pre-operatively Identify appropriate sources of fluids and proteins  State protein recommendations and appropriate sources pre and post-operatively Identify Post-Operative Dietary Goals and will follow for 2 weeks post-operatively Identify appropriate multivitamin and calcium sources Describe the need for physical activity post-operatively and will follow MD recommendations State when to call healthcare provider regarding medication questions or post-operative complications When having a diagnosis of diabetes understanding hypoglycemia symptoms and the inclusion of 1 complex carbohydrate per meal  Handouts given during class include: Pre-Op Bariatric Surgery Diet Handout Protein Shake Handout Post-Op Bariatric Surgery Nutrition Handout BELT Program Information Flyer Support Group Information Flyer [facility] Bariatric Supplements Price List  Follow-Up Plan: Patient will follow-up at [facility] 2 weeks post operatively for diet advancement per MD.

## 2020-12-23 NOTE — Progress Notes (Signed)
Please enter orders for PAT visit scheduled for 01-02-21 

## 2020-12-25 ENCOUNTER — Ambulatory Visit: Payer: Self-pay | Admitting: Surgery

## 2020-12-25 NOTE — H&P (View-Only) (Signed)
Sabrina White [id number]   Referring Provider:  Self   Subjective   Chief Complaint: Morbid obesity   History of Present Illness:    Returns for follow-up regarding surgical management of morbid obesity.  Denies any changes in her health since our initial visit 3 months ago.  Has completed the bariatric pathway with no barriers identified.  Has several questions to discuss today but is ready to proceed with gastric bypass with hiatal hernia repair next week.   CXR/UGI 10/22/20: Small to moderate type I hiatal hernia, widely patent distal esophageal mucosal ring Upper endoscopy 11/22/2020-negative for H. pylori, reflux esophagitis, multiple 2 to 8 mm sessile polyps in the gastric body, pathology benign fundic polyp, 4 cm hiatal hernia Labs 01/03/21: Unremarkable; preop nutrition labs demonstrated mildly low vitamin D, elevated triglycerides and total cholesterol, hemoglobin A1c 5.7 Dietician- approved Engineer, building services) Psychologist-approved ([facility] [place], [place] with [employer])  Initial visit 09/27/2020: 35 year old woman who presents in consultation for surgical management of morbid obesity.  She has been overweight for most of her life and has tried numerous methods of weight loss (including Atkins diet, weight watchers, Saxenda, Alli, metformin, Ozempic ) without any significant or sustained success.  She struggles with PCOS which makes it very difficult for her to lose weight.  She did have hypothyroidism but this was limited to during her pregnancy, and she had some postpartum anxiety.  She has tried diets, exercise programs, medications, etc.  She endorses a well-balanced diet, cooks most of her meals.  She is extremely active, playing sports, chasing her toddler around, and knows that over time the extra weight will affect her in a way that keeps her from being able to do that.  She is interested in bariatric surgery in order to afford more effective weight loss that will improve the  quality and quantity of her life and decrease her chances of developing complications of obesity.  She does have a history of severe reflux, for which she takes daily medications.  She did have an episode a few months ago when she was on vacation where she forgot to take her medications for a few days and she had a severe episode of reflux, so notes that she really cannot go without the medication.  She has been referred to GI for endoscopic evaluation by her primary care doctor.   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  She has a 31-month-old and her wife is pregnant, due in February.  Denies any tobacco product use, drug or alcohol use.  260 LB/BMI 43.3    Review of Systems: A complete review of systems was obtained from the patient.  I have reviewed this information and discussed as appropriate with the patient.  See HPI as well for other ROS.   Medical History: Past Medical History:  Diagnosis Date   Anxiety    GERD (gastroesophageal reflux disease)    Hypertension    Hypothyroidism    Morbid obesity (CMS-HCC)    PCOS (polycystic ovarian syndrome)     Patient Active Problem List  Diagnosis   Morbid obesity (CMS-HCC)   Hypertension, essential   Hypothyroidism   PCOS (polycystic ovarian syndrome)   Gastroesophageal reflux disease    Past Surgical History:  Procedure Laterality Date   CESAREAN SECTION     CESAREAN SECTION  2021     No Known Allergies  Current Outpatient Medications on File Prior to Visit  Medication Sig Dispense Refill  amLODIPine (NORVASC) 10 MG tablet Take 10 mg by mouth once daily     pantoprazole (PROTONIX) 40 MG DR tablet Take 40 mg by mouth once daily     sertraline (ZOLOFT) 50 MG tablet Take 1 tablet (50 mg total) by mouth once daily Taking half a tablet       No current facility-administered medications on file prior to visit.    Family History  Problem Relation Age of Onset   Breast cancer  Mother    High blood pressure (Hypertension) Father    No Known Problems Brother    Breast cancer Paternal Aunt    Breast cancer Maternal Grandmother      Social History   Tobacco Use  Smoking Status Never  Smokeless Tobacco Never     Social History   Socioeconomic History   Marital status: Married  Tobacco Use   Smoking status: Never   Smokeless tobacco: Never  Vaping Use   Vaping Use: Never used  Substance and Sexual Activity   Alcohol use: Yes   Drug use: Defer   Sexual activity: Yes    Objective:    Vitals:   01/09/21 0904  BP: (!) 138/90  Pulse: 97  Temp: 36.4 C (97.6 F)  SpO2: 97%  Weight: (!) 117.4 kg (258 lb 12.8 oz)  Height: 165.1 cm (5\' 5" )    Body mass index is 43.07 kg/m.  Alert and well-appearing Labored respirations    Assessment and Plan:  There are no diagnoses linked to this encounter.    Morbid obesity, GERD, hypertension, PCOS: She remains excellent candidate for Roux-en-Y gastric bypass.  We have previously discussed the relevant anatomy, surgical technique using a diagram to demonstrate.  Discussed the risks of bleeding, infection, pain, scarring, injury to intra-abdominal structures, staple line leak or abscess, chronic abdominal pain or nausea, marginal ulcer, internal hernia, bowel obstruction, malnutrition or vitamin deficiency, DVT/PE, pneumonia, heart attack, stroke, death, failure to reach weight loss goals and weight regain, hernia.  Discussed the typical pre-, peri-, and postoperative course.  Discussed the importance of lifelong behavioral changes to combat the chronic and relapsing disease which is obesity.  Questions were welcomed and answered to her satisfaction.    She has completed the pathway and is ready to proceed with surgery on Monday.     Khris Jansson Friday, MD

## 2020-12-25 NOTE — H&P (Signed)
Sabrina White [id number]   Referring Provider:  Self   Subjective   Chief Complaint: Morbid obesity   History of Present Illness:    Returns for follow-up regarding surgical management of morbid obesity.  Denies any changes in her health since our initial visit 3 months ago.  Has completed the bariatric pathway with no barriers identified.  Has several questions to discuss today but is ready to proceed with gastric bypass with hiatal hernia repair next week.   CXR/UGI 10/22/20: Small to moderate type I hiatal hernia, widely patent distal esophageal mucosal ring Upper endoscopy 11/22/2020-negative for H. pylori, reflux esophagitis, multiple 2 to 8 mm sessile polyps in the gastric body, pathology benign fundic polyp, 4 cm hiatal hernia Labs 01/03/21: Unremarkable; preop nutrition labs demonstrated mildly low vitamin D, elevated triglycerides and total cholesterol, hemoglobin A1c 5.7 Dietician- approved Engineer, building services) Psychologist-approved ([facility] [place], [place] with [employer])  Initial visit 09/27/2020: 35 year old woman who presents in consultation for surgical management of morbid obesity.  She has been overweight for most of her life and has tried numerous methods of weight loss (including Atkins diet, weight watchers, Saxenda, Alli, metformin, Ozempic ) without any significant or sustained success.  She struggles with PCOS which makes it very difficult for her to lose weight.  She did have hypothyroidism but this was limited to during her pregnancy, and she had some postpartum anxiety.  She has tried diets, exercise programs, medications, etc.  She endorses a well-balanced diet, cooks most of her meals.  She is extremely active, playing sports, chasing her toddler around, and knows that over time the extra weight will affect her in a way that keeps her from being able to do that.  She is interested in bariatric surgery in order to afford more effective weight loss that will improve the  quality and quantity of her life and decrease her chances of developing complications of obesity.  She does have a history of severe reflux, for which she takes daily medications.  She did have an episode a few months ago when she was on vacation where she forgot to take her medications for a few days and she had a severe episode of reflux, so notes that she really cannot go without the medication.  She has been referred to GI for endoscopic evaluation by her primary care doctor.   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  She has a 62-month-old and her wife is pregnant, due in February.  Denies any tobacco product use, drug or alcohol use.  260 LB/BMI 43.3    Review of Systems: A complete review of systems was obtained from the patient.  I have reviewed this information and discussed as appropriate with the patient.  See HPI as well for other ROS.   Medical History: Past Medical History:  Diagnosis Date   Anxiety    GERD (gastroesophageal reflux disease)    Hypertension    Hypothyroidism    Morbid obesity (CMS-HCC)    PCOS (polycystic ovarian syndrome)     Patient Active Problem List  Diagnosis   Morbid obesity (CMS-HCC)   Hypertension, essential   Hypothyroidism   PCOS (polycystic ovarian syndrome)   Gastroesophageal reflux disease    Past Surgical History:  Procedure Laterality Date   CESAREAN SECTION     CESAREAN SECTION  2021     No Known Allergies  Current Outpatient Medications on File Prior to Visit  Medication Sig Dispense Refill  amLODIPine (NORVASC) 10 MG tablet Take 10 mg by mouth once daily     pantoprazole (PROTONIX) 40 MG DR tablet Take 40 mg by mouth once daily     sertraline (ZOLOFT) 50 MG tablet Take 1 tablet (50 mg total) by mouth once daily Taking half a tablet       No current facility-administered medications on file prior to visit.    Family History  Problem Relation Age of Onset   Breast cancer  Mother    High blood pressure (Hypertension) Father    No Known Problems Brother    Breast cancer Paternal Aunt    Breast cancer Maternal Grandmother      Social History   Tobacco Use  Smoking Status Never  Smokeless Tobacco Never     Social History   Socioeconomic History   Marital status: Married  Tobacco Use   Smoking status: Never   Smokeless tobacco: Never  Vaping Use   Vaping Use: Never used  Substance and Sexual Activity   Alcohol use: Yes   Drug use: Defer   Sexual activity: Yes    Objective:    Vitals:   01/09/21 0904  BP: (!) 138/90  Pulse: 97  Temp: 36.4 C (97.6 F)  SpO2: 97%  Weight: (!) 117.4 kg (258 lb 12.8 oz)  Height: 165.1 cm (5\' 5" )    Body mass index is 43.07 kg/m.  Alert and well-appearing Labored respirations    Assessment and Plan:  There are no diagnoses linked to this encounter.    Morbid obesity, GERD, hypertension, PCOS: She remains excellent candidate for Roux-en-Y gastric bypass.  We have previously discussed the relevant anatomy, surgical technique using a diagram to demonstrate.  Discussed the risks of bleeding, infection, pain, scarring, injury to intra-abdominal structures, staple line leak or abscess, chronic abdominal pain or nausea, marginal ulcer, internal hernia, bowel obstruction, malnutrition or vitamin deficiency, DVT/PE, pneumonia, heart attack, stroke, death, failure to reach weight loss goals and weight regain, hernia.  Discussed the typical pre-, peri-, and postoperative course.  Discussed the importance of lifelong behavioral changes to combat the chronic and relapsing disease which is obesity.  Questions were welcomed and answered to her satisfaction.    She has completed the pathway and is ready to proceed with surgery on Monday.     Takiesha Mcdevitt Friday, MD

## 2021-01-02 ENCOUNTER — Other Ambulatory Visit (HOSPITAL_COMMUNITY): Payer: Self-pay

## 2021-01-02 ENCOUNTER — Encounter (HOSPITAL_COMMUNITY): Admission: RE | Admit: 2021-01-02 | Payer: BC Managed Care – PPO | Source: Ambulatory Visit

## 2021-01-02 NOTE — Patient Instructions (Addendum)
DUE TO COVID-19 ONLY ONE VISITOR IS ALLOWED TO COME WITH YOU AND STAY IN THE WAITING ROOM ONLY DURING PRE OP AND PROCEDURE.   **NO VISITORS ARE ALLOWED IN THE SHORT STAY AREA OR RECOVERY ROOM!!**  IF YOU WILL BE ADMITTED INTO THE HOSPITAL YOU ARE ALLOWED ONLY TWO SUPPORT PEOPLE DURING VISITATION HOURS ONLY (7 AM -8PM)    Up to two visitors ages 33+ are allowed at one time in a patient's room.  The visitors may rotate out with other people throughout the day.  Additionally, up to two children between the ages of 21 and 2 are allowed and do not count toward the number of allowed visitors.  Children within this age range must be accompanied by an adult visitor.  One adult visitor may remain with the patient overnight and must be in the room by 8 PM.  COVID SWAB TESTING MUST BE COMPLETED ON:  Thursday 01-09-21, Between the hours of 8 and 3  **MUST PRESENT COMPLETED FORM AT TESTING SITE**    [address] [place] (backside of the building)  You are not required to quarantine, however you are required to wear a well-fitted mask when you are out and around people not in your household.  Hand Hygiene often Do NOT share personal items Notify your provider if you are in close contact with someone who has COVID or you develop fever 100.4 or greater, new onset of sneezing, cough, sore throat, shortness of breath or body aches.        Your procedure is scheduled on: Monday, 01-13-21   Report to [facility] Main  Entrance     Report to admitting at 5:15 AM   Call this number if you have problems the morning of surgery [phone]   Do not eat solid food :After 6:00 PM.   May have liquids until 4:15 AM day of surgery  CLEAR LIQUID DIET  Foods Allowed                                                                     Foods Excluded  Water, Black Coffee (no milk/no creamer) and tea, regular and decaf                              liquids that you cannot  Plain Jell-O in  any flavor  (No red)                         see through such as: Fruit ices (not with fruit pulp)                                 milk, soups, orange juice  Iced Popsicles (No red)                                    All solid food                             Apple  juices Sports drinks like Gatorade (No red) Lightly seasoned clear broth or consume(fat free) Sugar Sample Menu Breakfast                                Lunch                                     Supper Cranberry juice                    Beef broth                            Chicken broth Jell-O                                     Grape juice                           Apple juice Coffee or tea                        Jell-O                                      Popsicle                                                Coffee or tea                        Coffee or tea      Complete one G2 drink the morning of surgery at 4:15 AM the day of surgery.      The day of surgery:  Drink ONE (1) Pre-Surgery G2  the morning of surgery. Drink in one sitting. Do not sip.  This drink was given to you during your hospital  pre-op appointment visit. Nothing else to drink after completing the Pre-Surgery G2.          If you have questions, please contact your surgeon's office.     Oral Hygiene is also important to reduce your risk of infection.                                    Remember - BRUSH YOUR TEETH THE MORNING OF SURGERY WITH YOUR REGULAR TOOTHPASTE   Do NOT smoke after Midnight   Take these medicines the morning of surgery with A SIP OF WATER:  Amlodipine, Pepcid, Pantoprazole, Sertraline.  Albuterol inhaler   Stop all vitamins and herbal supplements a week before surgery             You may not have any metal on your body including hair pins, jewelry, and body piercing             Do not wear make-up, lotions, powders, perfumes or deodorant  Do not wear nail polish including gel and S&S, artificial/acrylic nails,  or any other  type of covering on natural nails including finger and toenails. If you have artificial nails, gel coating, etc. that needs to be removed by a nail salon please have this removed prior to surgery or surgery may need to be canceled/ delayed if the surgeon/ anesthesia feels like they are unable to be safely monitored.   Do not shave  48 hours prior to surgery.        Do not bring valuables to the hospital. [facility] IS NOT RESPONSIBLE FOR VALUABLES.   Contacts, dentures or bridgework may not be worn into surgery.   Bring small overnight bag day of surgery.   Special Instructions: Bring a copy of your healthcare power of attorney and living will documents the day of surgery if you haven't scanned them in before.  Please read over the following fact sheets you were given: IF YOU HAVE QUESTIONS ABOUT YOUR PRE OP INSTRUCTIONS PLEASE CALL [phone]   [place] - Preparing for Surgery Before surgery, you can play an important role.  Because skin is not sterile, your skin needs to be as free of germs as possible.  You can reduce the number of germs on your skin by washing with CHG (chlorahexidine gluconate) soap before surgery.  CHG is an antiseptic cleaner which kills germs and bonds with the skin to continue killing germs even after washing. Please DO NOT use if you have an allergy to CHG or antibacterial soaps.  If your skin becomes reddened/irritated stop using the CHG and inform your nurse when you arrive at Short Stay. Do not shave (including legs and underarms) for at least 48 hours prior to the first CHG shower.  You may shave your face/neck.  Please follow these instructions carefully:  1.  Shower with CHG Soap the night before surgery and the  morning of surgery.  2.  If you choose to wash your hair, wash your hair first as usual with your normal  shampoo.  3.  After you shampoo, rinse your hair and body thoroughly to remove the shampoo.                             4.  Use CHG as you  would any other liquid soap.  You can apply chg directly to the skin and wash.  Gently with a scrungie or clean washcloth.  5.  Apply the CHG Soap to your body ONLY FROM THE NECK DOWN.   Do   not use on face/ open                           Wound or open sores. Avoid contact with eyes, ears mouth and   genitals (private parts).                       Wash face,  Genitals (private parts) with your normal soap.             6.  Wash thoroughly, paying special attention to the area where your    surgery  will be performed.  7.  Thoroughly rinse your body with warm water from the neck down.  8.  DO NOT shower/wash with your normal soap after using and rinsing off the CHG Soap.                9.  Pat yourself dry with a clean  towel.            10.  Wear clean pajamas.            11.  Place clean sheets on your bed the night of your first shower and do not  sleep with pets. Day of Surgery : Do not apply any lotions/deodorants the morning of surgery.  Please wear clean clothes to the hospital/surgery center.  FAILURE TO FOLLOW THESE INSTRUCTIONS MAY RESULT IN THE CANCELLATION OF YOUR SURGERY  PATIENT SIGNATURE_________________________________  NURSE SIGNATURE__________________________________  ________________________________________________________________________   Rogelia Mire  An incentive spirometer is a tool that can help keep your lungs clear and active. This tool measures how well you are filling your lungs with each breath. Taking long deep breaths may help reverse or decrease the chance of developing breathing (pulmonary) problems (especially infection) following: A long period of time when you are unable to move or be active. BEFORE THE PROCEDURE  If the spirometer includes an indicator to show your best effort, your nurse or respiratory therapist will set it to a desired goal. If possible, sit up straight or lean slightly forward. Try not to slouch. Hold the incentive spirometer  in an upright position. INSTRUCTIONS FOR USE  Sit on the edge of your bed if possible, or sit up as far as you can in bed or on a chair. Hold the incentive spirometer in an upright position. Breathe out normally. Place the mouthpiece in your mouth and seal your lips tightly around it. Breathe in slowly and as deeply as possible, raising the piston or the ball toward the top of the column. Hold your breath for 3-5 seconds or for as long as possible. Allow the piston or ball to fall to the bottom of the column. Remove the mouthpiece from your mouth and breathe out normally. Rest for a few seconds and repeat Steps 1 through 7 at least 10 times every 1-2 hours when you are awake. Take your time and take a few normal breaths between deep breaths. The spirometer may include an indicator to show your best effort. Use the indicator as a goal to work toward during each repetition. After each set of 10 deep breaths, practice coughing to be sure your lungs are clear. If you have an incision (the cut made at the time of surgery), support your incision when coughing by placing a pillow or rolled up towels firmly against it. Once you are able to get out of bed, walk around indoors and cough well. You may stop using the incentive spirometer when instructed by your caregiver.  RISKS AND COMPLICATIONS Take your time so you do not get dizzy or light-headed. If you are in pain, you may need to take or ask for pain medication before doing incentive spirometry. It is harder to take a deep breath if you are having pain. AFTER USE Rest and breathe slowly and easily. It can be helpful to keep track of a log of your progress. Your caregiver can provide you with a simple table to help with this. If you are using the spirometer at home, follow these instructions: SEEK MEDICAL CARE IF:  You are having difficultly using the spirometer. You have trouble using the spirometer as often as instructed. Your pain medication is  not giving enough relief while using the spirometer. You develop fever of 100.5 F (38.1 C) or higher. SEEK IMMEDIATE MEDICAL CARE IF:  You cough up bloody sputum that had not been present before. You develop fever  of 102 F (38.9 C) or greater. You develop worsening pain at or near the incision site. MAKE SURE YOU:  Understand these instructions. Will watch your condition. Will get help right away if you are not doing well or get worse. Document Released: 06/22/2006 Document Revised: 05/04/2011 Document Reviewed: 08/23/2006 [facility] Patient Information 2014 [place], [place].   ________________________________________________________________________

## 2021-01-03 ENCOUNTER — Encounter (HOSPITAL_COMMUNITY): Payer: Self-pay | Admitting: *Deleted

## 2021-01-03 ENCOUNTER — Encounter (HOSPITAL_COMMUNITY)
Admission: RE | Admit: 2021-01-03 | Discharge: 2021-01-03 | Disposition: A | Discharge status: Home / Self Care | Payer: BC Managed Care – PPO | Source: Ambulatory Visit | Attending: Surgery | Admitting: Surgery

## 2021-01-03 ENCOUNTER — Other Ambulatory Visit: Payer: Self-pay

## 2021-01-03 DIAGNOSIS — Z01812 Encounter for preprocedural laboratory examination: Secondary | ICD-10-CM | POA: Insufficient documentation

## 2021-01-03 HISTORY — DX: Personal history of other diseases of the digestive system: Z87.19

## 2021-01-03 HISTORY — DX: Anxiety disorder, unspecified: F41.9

## 2021-01-03 HISTORY — DX: Family history of other specified conditions: Z84.89

## 2021-01-03 LAB — CBC WITH DIFFERENTIAL/PLATELET
Abs Immature Granulocytes: 0.04 10*3/uL (ref 0.00–0.07)
Basophils Absolute: 0.1 10*3/uL (ref 0.0–0.1)
Basophils Relative: 1 %
Eosinophils Absolute: 0.3 10*3/uL (ref 0.0–0.5)
Eosinophils Relative: 4 %
HCT: 42.1 % (ref 36.0–46.0)
Hemoglobin: 14 g/dL (ref 12.0–15.0)
Immature Granulocytes: 0 %
Lymphocytes Relative: 31 %
Lymphs Abs: 2.8 10*3/uL (ref 0.7–4.0)
MCH: 27.3 pg (ref 26.0–34.0)
MCHC: 33.3 g/dL (ref 30.0–36.0)
MCV: 82.2 fL (ref 80.0–100.0)
Monocytes Absolute: 0.5 10*3/uL (ref 0.1–1.0)
Monocytes Relative: 5 %
Neutro Abs: 5.4 10*3/uL (ref 1.7–7.7)
Neutrophils Relative %: 59 %
Platelets: 260 10*3/uL (ref 150–400)
RBC: 5.12 MIL/uL — ABNORMAL HIGH (ref 3.87–5.11)
RDW: 13.4 % (ref 11.5–15.5)
WBC: 9 10*3/uL (ref 4.0–10.5)
nRBC: 0 % (ref 0.0–0.2)

## 2021-01-03 LAB — COMPREHENSIVE METABOLIC PANEL
ALT: 29 U/L (ref 0–44)
AST: 25 U/L (ref 15–41)
Albumin: 4.1 g/dL (ref 3.5–5.0)
Alkaline Phosphatase: 51 U/L (ref 38–126)
Anion gap: 9 (ref 5–15)
BUN: 22 mg/dL — ABNORMAL HIGH (ref 6–20)
CO2: 27 mmol/L (ref 22–32)
Calcium: 9 mg/dL (ref 8.9–10.3)
Chloride: 99 mmol/L (ref 98–111)
Creatinine, Ser: 0.87 mg/dL (ref 0.44–1.00)
GFR, Estimated: >60 mL/min (ref >60)
Glucose, Bld: 91 mg/dL (ref 70–99)
Potassium: 3.8 mmol/L (ref 3.5–5.1)
Sodium: 135 mmol/L (ref 135–145)
Total Bilirubin: 0.6 mg/dL (ref 0.3–1.2)
Total Protein: 8 g/dL (ref 6.5–8.1)

## 2021-01-03 NOTE — Progress Notes (Signed)
COVID test-01/09/21   PCP - Dr. Cathleen Fears Cardiologist - none  Chest x-ray - 10/22/20-epic EKG - 10/22/20-epic Stress Test - no ECHO - no Cardiac Cath - no Pacemaker/ICD device last checked:NA  Sleep Study - no CPAP -   Fasting Blood Sugar - NA Checks Blood Sugar _____ times a day  Blood Thinner Instructions:NA Aspirin Instructions: Last Dose:  Anesthesia review: no  Patient denies shortness of breath, fever, cough and chest pain at PAT appointment Pt has no SOB with any activities  Patient verbalized understanding of instructions that were given to them at the PAT appointment. Patient was also instructed that they will need to review over the PAT instructions again at home before surgery. yes

## 2021-01-09 ENCOUNTER — Other Ambulatory Visit: Payer: Self-pay | Admitting: Surgery

## 2021-01-09 LAB — SARS CORONAVIRUS 2 (TAT 6-24 HRS): SARS Coronavirus 2: NEGATIVE

## 2021-01-13 ENCOUNTER — Inpatient Hospital Stay (HOSPITAL_COMMUNITY)
Admission: RE | Admit: 2021-01-13 | Discharge: 2021-01-14 | DRG: 621 | Disposition: A | Discharge status: Home / Self Care | Payer: BC Managed Care – PPO | Source: Ambulatory Visit | Attending: Surgery | Admitting: Surgery

## 2021-01-13 ENCOUNTER — Encounter (HOSPITAL_COMMUNITY)
Admission: RE | Disposition: A | Discharge status: Home / Self Care | Payer: Self-pay | Source: Ambulatory Visit | Attending: Surgery

## 2021-01-13 ENCOUNTER — Encounter (HOSPITAL_COMMUNITY): Payer: Self-pay | Admitting: Surgery

## 2021-01-13 ENCOUNTER — Inpatient Hospital Stay (HOSPITAL_COMMUNITY): Payer: BC Managed Care – PPO | Admitting: Certified Registered Nurse Anesthetist

## 2021-01-13 DIAGNOSIS — Z8249 Family history of ischemic heart disease and other diseases of the circulatory system: Secondary | ICD-10-CM

## 2021-01-13 DIAGNOSIS — K449 Diaphragmatic hernia without obstruction or gangrene: Secondary | ICD-10-CM | POA: Diagnosis present

## 2021-01-13 DIAGNOSIS — Z79899 Other long term (current) drug therapy: Secondary | ICD-10-CM | POA: Diagnosis not present

## 2021-01-13 DIAGNOSIS — Z6841 Body Mass Index (BMI) 40.0 and over, adult: Secondary | ICD-10-CM | POA: Diagnosis not present

## 2021-01-13 DIAGNOSIS — I10 Essential (primary) hypertension: Secondary | ICD-10-CM | POA: Diagnosis present

## 2021-01-13 DIAGNOSIS — K219 Gastro-esophageal reflux disease without esophagitis: Secondary | ICD-10-CM | POA: Diagnosis present

## 2021-01-13 DIAGNOSIS — E559 Vitamin D deficiency, unspecified: Secondary | ICD-10-CM | POA: Diagnosis present

## 2021-01-13 DIAGNOSIS — E781 Pure hyperglyceridemia: Secondary | ICD-10-CM | POA: Diagnosis present

## 2021-01-13 HISTORY — PX: GASTRIC ROUX-EN-Y: SHX5262

## 2021-01-13 HISTORY — PX: HIATAL HERNIA REPAIR: SHX195

## 2021-01-13 LAB — TYPE AND SCREEN
ABO/RH(D): A NEG
Antibody Screen: NEGATIVE

## 2021-01-13 LAB — PREGNANCY, URINE: Preg Test, Ur: NEGATIVE

## 2021-01-13 LAB — HEMOGLOBIN AND HEMATOCRIT, BLOOD
HCT: 42.6 % (ref 36.0–46.0)
Hemoglobin: 14.1 g/dL (ref 12.0–15.0)

## 2021-01-13 SURGERY — LAPAROSCOPIC ROUX-EN-Y GASTRIC BYPASS WITH UPPER ENDOSCOPY
Anesthesia: General | Site: Abdomen

## 2021-01-13 MED ORDER — METOCLOPRAMIDE HCL 5 MG/ML IJ SOLN
10.0000 mg | Freq: Four times a day (QID) | INTRAMUSCULAR | Status: DC
  Administered 2021-01-13 – 2021-01-14 (×3): 10 mg via INTRAVENOUS
  Filled 2021-01-13 (×3): qty 2

## 2021-01-13 MED ORDER — LIDOCAINE 2% (20 MG/ML) 5 ML SYRINGE
INTRAMUSCULAR | Status: DC | PRN
  Administered 2021-01-13: 80 mg via INTRAVENOUS

## 2021-01-13 MED ORDER — ACETAMINOPHEN 500 MG PO TABS
1000.0000 mg | ORAL_TABLET | ORAL | Status: AC
  Administered 2021-01-13: 1000 mg via ORAL
  Filled 2021-01-13: qty 2

## 2021-01-13 MED ORDER — TRAMADOL HCL 50 MG PO TABS
50.0000 mg | ORAL_TABLET | Freq: Four times a day (QID) | ORAL | Status: DC | PRN
  Administered 2021-01-13: 50 mg via ORAL
  Filled 2021-01-13: qty 1

## 2021-01-13 MED ORDER — CHLORHEXIDINE GLUCONATE 0.12 % MT SOLN
15.0000 mL | Freq: Once | OROMUCOSAL | Status: AC
  Administered 2021-01-13: 15 mL via OROMUCOSAL

## 2021-01-13 MED ORDER — ROCURONIUM BROMIDE 10 MG/ML (PF) SYRINGE
PREFILLED_SYRINGE | INTRAVENOUS | Status: DC | PRN
Start: 2021-01-13 — End: 2021-01-13
  Administered 2021-01-13: 20 mg via INTRAVENOUS
  Administered 2021-01-13: 60 mg via INTRAVENOUS
  Administered 2021-01-13: 30 mg via INTRAVENOUS
  Administered 2021-01-13: 20 mg via INTRAVENOUS

## 2021-01-13 MED ORDER — DOCUSATE SODIUM 100 MG PO CAPS
100.0000 mg | ORAL_CAPSULE | Freq: Two times a day (BID) | ORAL | Status: DC
  Administered 2021-01-13 – 2021-01-14 (×3): 100 mg via ORAL
  Filled 2021-01-13 (×3): qty 1

## 2021-01-13 MED ORDER — FENTANYL CITRATE (PF) 250 MCG/5ML IJ SOLN
INTRAMUSCULAR | Status: AC
  Filled 2021-01-13: qty 5

## 2021-01-13 MED ORDER — ONDANSETRON HCL 4 MG/2ML IJ SOLN: 4.0000 mg | INTRAMUSCULAR | Status: DC | PRN

## 2021-01-13 MED ORDER — APREPITANT 40 MG PO CAPS
40.0000 mg | ORAL_CAPSULE | ORAL | Status: AC
  Administered 2021-01-13: 40 mg via ORAL
  Filled 2021-01-13: qty 1

## 2021-01-13 MED ORDER — FENTANYL CITRATE PF 50 MCG/ML IJ SOSY
PREFILLED_SYRINGE | INTRAMUSCULAR | Status: AC
  Administered 2021-01-13: 25 ug via INTRAVENOUS
  Filled 2021-01-13: qty 2

## 2021-01-13 MED ORDER — GABAPENTIN 100 MG PO CAPS
200.0000 mg | ORAL_CAPSULE | Freq: Two times a day (BID) | ORAL | Status: DC
  Administered 2021-01-13 – 2021-01-14 (×3): 200 mg via ORAL
  Filled 2021-01-13 (×3): qty 2

## 2021-01-13 MED ORDER — PHENYLEPHRINE 40 MCG/ML (10ML) SYRINGE FOR IV PUSH (FOR BLOOD PRESSURE SUPPORT)
PREFILLED_SYRINGE | INTRAVENOUS | Status: AC
  Filled 2021-01-13: qty 10

## 2021-01-13 MED ORDER — PHENYLEPHRINE HCL-NACL 20-0.9 MG/250ML-% IV SOLN
INTRAVENOUS | Status: DC | PRN
  Administered 2021-01-13: 25 ug/min via INTRAVENOUS

## 2021-01-13 MED ORDER — "VISTASEAL 4 ML SINGLE DOSE KIT "
4.0000 mL | PACK | Freq: Once | CUTANEOUS | Status: AC
  Administered 2021-01-13: 4 mL via TOPICAL
  Filled 2021-01-13: qty 4

## 2021-01-13 MED ORDER — ACETAMINOPHEN 160 MG/5ML PO SOLN: 1000.0000 mg | Freq: Three times a day (TID) | ORAL | Status: DC

## 2021-01-13 MED ORDER — BUPIVACAINE-EPINEPHRINE (PF) 0.25% -1:200000 IJ SOLN
INTRAMUSCULAR | Status: AC
  Filled 2021-01-13: qty 30

## 2021-01-13 MED ORDER — ROCURONIUM BROMIDE 10 MG/ML (PF) SYRINGE
PREFILLED_SYRINGE | INTRAVENOUS | Status: AC
  Filled 2021-01-13: qty 10

## 2021-01-13 MED ORDER — SUGAMMADEX SODIUM 200 MG/2ML IV SOLN
INTRAVENOUS | Status: DC | PRN
  Administered 2021-01-13: 300 mg via INTRAVENOUS

## 2021-01-13 MED ORDER — PHENYLEPHRINE 40 MCG/ML (10ML) SYRINGE FOR IV PUSH (FOR BLOOD PRESSURE SUPPORT)
PREFILLED_SYRINGE | INTRAVENOUS | Status: DC | PRN
  Administered 2021-01-13 (×3): 80 ug via INTRAVENOUS

## 2021-01-13 MED ORDER — SUGAMMADEX SODIUM 500 MG/5ML IV SOLN
INTRAVENOUS | Status: AC
  Filled 2021-01-13: qty 5

## 2021-01-13 MED ORDER — CHLORHEXIDINE GLUCONATE 4 % EX LIQD: 60.0000 mL | Freq: Once | CUTANEOUS | Status: DC

## 2021-01-13 MED ORDER — GABAPENTIN 300 MG PO CAPS
300.0000 mg | ORAL_CAPSULE | ORAL | Status: AC
  Administered 2021-01-13: 300 mg via ORAL
  Filled 2021-01-13: qty 1

## 2021-01-13 MED ORDER — LACTATED RINGERS IR SOLN
Status: DC | PRN
  Administered 2021-01-13: 1000 mL

## 2021-01-13 MED ORDER — DEXAMETHASONE SODIUM PHOSPHATE 10 MG/ML IJ SOLN
INTRAMUSCULAR | Status: DC | PRN
  Administered 2021-01-13: 6 mg via INTRAVENOUS

## 2021-01-13 MED ORDER — AMISULPRIDE (ANTIEMETIC) 5 MG/2ML IV SOLN: 10.0000 mg | Freq: Once | INTRAVENOUS | Status: AC | PRN

## 2021-01-13 MED ORDER — EPHEDRINE SULFATE-NACL 50-0.9 MG/10ML-% IV SOSY
PREFILLED_SYRINGE | INTRAVENOUS | Status: DC | PRN
  Administered 2021-01-13: 5 mg via INTRAVENOUS

## 2021-01-13 MED ORDER — METOPROLOL TARTRATE 5 MG/5ML IV SOLN: 5.0000 mg | Freq: Four times a day (QID) | INTRAVENOUS | Status: DC | PRN

## 2021-01-13 MED ORDER — BUPIVACAINE LIPOSOME 1.3 % IJ SUSP
INTRAMUSCULAR | Status: DC | PRN
  Administered 2021-01-13: 20 mL

## 2021-01-13 MED ORDER — FENTANYL CITRATE PF 50 MCG/ML IJ SOSY
PREFILLED_SYRINGE | INTRAMUSCULAR | Status: AC
  Administered 2021-01-13: 50 ug via INTRAVENOUS
  Filled 2021-01-13: qty 1

## 2021-01-13 MED ORDER — AMISULPRIDE (ANTIEMETIC) 5 MG/2ML IV SOLN
INTRAVENOUS | Status: AC
  Administered 2021-01-13: 10 mg via INTRAVENOUS
  Filled 2021-01-13: qty 4

## 2021-01-13 MED ORDER — METHOCARBAMOL 1000 MG/10ML IJ SOLN
500.0000 mg | Freq: Four times a day (QID) | INTRAVENOUS | Status: DC | PRN
  Filled 2021-01-13: qty 5

## 2021-01-13 MED ORDER — PROPOFOL 10 MG/ML IV BOLUS
INTRAVENOUS | Status: DC | PRN
  Administered 2021-01-13: 200 mg via INTRAVENOUS

## 2021-01-13 MED ORDER — 0.9 % SODIUM CHLORIDE (POUR BTL) OPTIME
TOPICAL | Status: DC | PRN
  Administered 2021-01-13: 1000 mL

## 2021-01-13 MED ORDER — FIBRIN SEALANT 2 ML SINGLE DOSE KIT
2.0000 mL | PACK | Freq: Once | CUTANEOUS | Status: AC
  Administered 2021-01-13: 2 mL via TOPICAL
  Filled 2021-01-13: qty 2

## 2021-01-13 MED ORDER — DEXAMETHASONE SODIUM PHOSPHATE 10 MG/ML IJ SOLN
INTRAMUSCULAR | Status: AC
  Filled 2021-01-13: qty 1

## 2021-01-13 MED ORDER — PANTOPRAZOLE SODIUM 40 MG IV SOLR
40.0000 mg | Freq: Every day | INTRAVENOUS | Status: DC
  Administered 2021-01-13: 40 mg via INTRAVENOUS
  Filled 2021-01-13: qty 40

## 2021-01-13 MED ORDER — ONDANSETRON HCL 4 MG/2ML IJ SOLN
INTRAMUSCULAR | Status: AC
  Filled 2021-01-13: qty 2

## 2021-01-13 MED ORDER — BUPIVACAINE LIPOSOME 1.3 % IJ SUSP: 20.0000 mL | Freq: Once | INTRAMUSCULAR | Status: DC

## 2021-01-13 MED ORDER — ALBUTEROL SULFATE (2.5 MG/3ML) 0.083% IN NEBU: 3.0000 mL | INHALATION_SOLUTION | Freq: Four times a day (QID) | RESPIRATORY_TRACT | Status: DC | PRN

## 2021-01-13 MED ORDER — LIDOCAINE HCL (PF) 2 % IJ SOLN
INTRAMUSCULAR | Status: AC
  Filled 2021-01-13: qty 5

## 2021-01-13 MED ORDER — BUPIVACAINE LIPOSOME 1.3 % IJ SUSP
INTRAMUSCULAR | Status: AC
  Filled 2021-01-13: qty 20

## 2021-01-13 MED ORDER — KETOROLAC TROMETHAMINE 15 MG/ML IJ SOLN: 15.0000 mg | Freq: Three times a day (TID) | INTRAMUSCULAR | Status: DC | PRN

## 2021-01-13 MED ORDER — MIDAZOLAM HCL 5 MG/5ML IJ SOLN
INTRAMUSCULAR | Status: DC | PRN
Start: 2021-01-13 — End: 2021-01-13
  Administered 2021-01-13: 2 mg via INTRAVENOUS

## 2021-01-13 MED ORDER — LIDOCAINE 2% (20 MG/ML) 5 ML SYRINGE
INTRAMUSCULAR | Status: DC | PRN
  Administered 2021-01-13: 1.5 mg/kg/h via INTRAVENOUS

## 2021-01-13 MED ORDER — KETAMINE HCL 10 MG/ML IJ SOLN
INTRAMUSCULAR | Status: AC
  Filled 2021-01-13: qty 1

## 2021-01-13 MED ORDER — AMLODIPINE BESYLATE 5 MG PO TABS
5.0000 mg | ORAL_TABLET | Freq: Every day | ORAL | Status: DC
  Administered 2021-01-14: 5 mg via ORAL
  Filled 2021-01-13: qty 1

## 2021-01-13 MED ORDER — HYDRALAZINE HCL 20 MG/ML IJ SOLN: 10.0000 mg | INTRAMUSCULAR | Status: DC | PRN

## 2021-01-13 MED ORDER — EPHEDRINE 5 MG/ML INJ
INTRAVENOUS | Status: AC
  Filled 2021-01-13: qty 5

## 2021-01-13 MED ORDER — HEPARIN SODIUM (PORCINE) 5000 UNIT/ML IJ SOLN
5000.0000 [IU] | INTRAMUSCULAR | Status: AC
  Administered 2021-01-13: 5000 [IU] via SUBCUTANEOUS
  Filled 2021-01-13: qty 1

## 2021-01-13 MED ORDER — OXYCODONE HCL 5 MG/5ML PO SOLN
5.0000 mg | Freq: Four times a day (QID) | ORAL | Status: DC | PRN
  Administered 2021-01-13: 5 mg via ORAL
  Filled 2021-01-13: qty 5

## 2021-01-13 MED ORDER — SCOPOLAMINE 1 MG/3DAYS TD PT72
1.0000 | MEDICATED_PATCH | TRANSDERMAL | Status: DC
  Administered 2021-01-13: 1.5 mg via TRANSDERMAL
  Filled 2021-01-13: qty 1

## 2021-01-13 MED ORDER — SIMETHICONE 80 MG PO CHEW
80.0000 mg | CHEWABLE_TABLET | Freq: Four times a day (QID) | ORAL | Status: DC | PRN
  Administered 2021-01-13: 80 mg via ORAL
  Filled 2021-01-13: qty 1

## 2021-01-13 MED ORDER — ENSURE MAX PROTEIN PO LIQD
2.0000 [oz_av] | ORAL | Status: DC
  Administered 2021-01-14 (×2): 2 [oz_av] via ORAL

## 2021-01-13 MED ORDER — SERTRALINE HCL 25 MG PO TABS
25.0000 mg | ORAL_TABLET | Freq: Every day | ORAL | Status: DC
  Administered 2021-01-14: 25 mg via ORAL
  Filled 2021-01-13: qty 1

## 2021-01-13 MED ORDER — ORAL CARE MOUTH RINSE: 15.0000 mL | Freq: Once | OROMUCOSAL | Status: AC

## 2021-01-13 MED ORDER — MIDAZOLAM HCL 2 MG/2ML IJ SOLN
INTRAMUSCULAR | Status: AC
  Filled 2021-01-13: qty 2

## 2021-01-13 MED ORDER — HYDROMORPHONE HCL 1 MG/ML IJ SOLN: 0.5000 mg | INTRAMUSCULAR | Status: DC | PRN

## 2021-01-13 MED ORDER — KETAMINE HCL 10 MG/ML IJ SOLN
INTRAMUSCULAR | Status: DC | PRN
  Administered 2021-01-13: 30 mg via INTRAVENOUS

## 2021-01-13 MED ORDER — STERILE WATER FOR IRRIGATION IR SOLN
Status: DC | PRN
  Administered 2021-01-13: 1000 mL

## 2021-01-13 MED ORDER — ENOXAPARIN SODIUM 30 MG/0.3ML IJ SOSY
30.0000 mg | PREFILLED_SYRINGE | Freq: Two times a day (BID) | INTRAMUSCULAR | Status: DC
  Administered 2021-01-13 – 2021-01-14 (×2): 30 mg via SUBCUTANEOUS
  Filled 2021-01-13 (×2): qty 0.3

## 2021-01-13 MED ORDER — FENTANYL CITRATE PF 50 MCG/ML IJ SOSY
25.0000 ug | PREFILLED_SYRINGE | INTRAMUSCULAR | Status: DC | PRN
  Administered 2021-01-13 (×3): 25 ug via INTRAVENOUS

## 2021-01-13 MED ORDER — LACTATED RINGERS IV SOLN: INTRAVENOUS | Status: DC

## 2021-01-13 MED ORDER — SODIUM CHLORIDE 0.9 % IV SOLN
2.0000 g | INTRAVENOUS | Status: AC
  Administered 2021-01-13: 2 g via INTRAVENOUS
  Filled 2021-01-13: qty 2

## 2021-01-13 MED ORDER — FENTANYL CITRATE (PF) 250 MCG/5ML IJ SOLN
INTRAMUSCULAR | Status: DC | PRN
  Administered 2021-01-13 (×2): 100 ug via INTRAVENOUS
  Administered 2021-01-13: 50 ug via INTRAVENOUS

## 2021-01-13 MED ORDER — PROPOFOL 10 MG/ML IV BOLUS
INTRAVENOUS | Status: AC
  Filled 2021-01-13: qty 20

## 2021-01-13 MED ORDER — ONDANSETRON HCL 4 MG/2ML IJ SOLN
INTRAMUSCULAR | Status: DC | PRN
  Administered 2021-01-13: 4 mg via INTRAVENOUS

## 2021-01-13 MED ORDER — ACETAMINOPHEN 500 MG PO TABS
1000.0000 mg | ORAL_TABLET | Freq: Three times a day (TID) | ORAL | Status: DC
  Administered 2021-01-13 – 2021-01-14 (×2): 1000 mg via ORAL
  Filled 2021-01-13 (×2): qty 2

## 2021-01-13 MED ORDER — SODIUM CHLORIDE 0.9 % IV SOLN: INTRAVENOUS | Status: DC

## 2021-01-13 SURGICAL SUPPLY — 70 items
APPLICATOR COTTON TIP 6 STRL (MISCELLANEOUS) IMPLANT
APPLICATOR COTTON TIP 6IN STRL (MISCELLANEOUS)
APPLICATOR VISTASEAL 35 (MISCELLANEOUS) ×2 IMPLANT
APPLIER CLIP ROT 13.4 12 LRG (CLIP)
BAG COUNTER SPONGE SURGICOUNT (BAG) IMPLANT
BENZOIN TINCTURE PRP APPL 2/3 (GAUZE/BANDAGES/DRESSINGS) ×2 IMPLANT
BLADE SURG SZ11 CARB STEEL (BLADE) ×2 IMPLANT
BNDG ADH 1X3 SHEER STRL LF (GAUZE/BANDAGES/DRESSINGS) ×2 IMPLANT
CABLE HIGH FREQUENCY MONO STRZ (ELECTRODE) IMPLANT
CHLORAPREP W/TINT 26 (MISCELLANEOUS) ×4 IMPLANT
CLIP APPLIE ROT 13.4 12 LRG (CLIP) IMPLANT
CLIP SUT LAPRA TY ABSORB (SUTURE) ×4 IMPLANT
COVER SURGICAL LIGHT HANDLE (MISCELLANEOUS) ×2 IMPLANT
DEVICE SUT QUICK LOAD TK 5 (STAPLE) ×8 IMPLANT
DEVICE SUT TI-KNOT TK 5X26 (MISCELLANEOUS) ×2 IMPLANT
DEVICE SUTURE ENDOST 10MM (ENDOMECHANICALS) ×2 IMPLANT
DRAIN PENROSE 0.25X18 (DRAIN) ×2 IMPLANT
ELECT REM PT RETURN 15FT ADLT (MISCELLANEOUS) ×2 IMPLANT
GAUZE 4X4 16PLY ~~LOC~~+RFID DBL (SPONGE) ×2 IMPLANT
GAUZE SPONGE 4X4 12PLY STRL (GAUZE/BANDAGES/DRESSINGS) IMPLANT
GLOVE SURG ENC MOIS LTX SZ6 (GLOVE) ×2 IMPLANT
GLOVE SURG MICRO LTX SZ6 (GLOVE) ×2 IMPLANT
GLOVE SURG UNDER LTX SZ6.5 (GLOVE) ×2 IMPLANT
GOWN STRL REUS W/TWL LRG LVL3 (GOWN DISPOSABLE) ×2 IMPLANT
GOWN STRL REUS W/TWL XL LVL3 (GOWN DISPOSABLE) ×6 IMPLANT
IRRIG SUCT STRYKERFLOW 2 WTIP (MISCELLANEOUS) ×2
IRRIGATION SUCT STRKRFLW 2 WTP (MISCELLANEOUS) ×1 IMPLANT
KIT BASIN OR (CUSTOM PROCEDURE TRAY) ×2 IMPLANT
KIT GASTRIC LAVAGE 34FR ADT (SET/KITS/TRAYS/PACK) ×2 IMPLANT
KIT TURNOVER KIT A (KITS) IMPLANT
MARKER SKIN DUAL TIP RULER LAB (MISCELLANEOUS) ×2 IMPLANT
MAT PREVALON FULL STRYKER (MISCELLANEOUS) ×2 IMPLANT
NEEDLE SPNL 22GX3.5 QUINCKE BK (NEEDLE) ×2 IMPLANT
PACK CARDIOVASCULAR III (CUSTOM PROCEDURE TRAY) ×2 IMPLANT
PENCIL SMOKE EVACUATOR (MISCELLANEOUS) IMPLANT
RELOAD ENDO STITCH 2.0 (ENDOMECHANICALS) ×8
RELOAD STAPLER BLUE 60MM (STAPLE) ×2 IMPLANT
RELOAD STAPLER GOLD 60MM (STAPLE) IMPLANT
RELOAD STAPLER GREEN 60MM (STAPLE) ×1 IMPLANT
RELOAD STAPLER WHITE 60MM (STAPLE) ×2 IMPLANT
SCISSORS LAP 5X45 EPIX DISP (ENDOMECHANICALS) ×2 IMPLANT
SET TUBE SMOKE EVAC HIGH FLOW (TUBING) ×2 IMPLANT
SHEARS HARMONIC ACE PLUS 45CM (MISCELLANEOUS) ×2 IMPLANT
SLEEVE XCEL OPT CAN 5 100 (ENDOMECHANICALS) ×4 IMPLANT
SOL ANTI FOG 6CC (MISCELLANEOUS) ×1 IMPLANT
SOLUTION ANTI FOG 6CC (MISCELLANEOUS) ×1
STAPLER ECHELON BIOABSB 60 FLE (MISCELLANEOUS) IMPLANT
STAPLER ECHELON LONG 60 440 (INSTRUMENTS) ×2 IMPLANT
STAPLER RELOAD BLUE 60MM (STAPLE) ×4
STAPLER RELOAD GOLD 60MM (STAPLE)
STAPLER RELOAD GREEN 60MM (STAPLE) ×2
STAPLER RELOAD WHITE 60MM (STAPLE) ×4
STRIP CLOSURE SKIN 1/2X4 (GAUZE/BANDAGES/DRESSINGS) ×2 IMPLANT
SUT MNCRL AB 4-0 PS2 18 (SUTURE) ×2 IMPLANT
SUT RELOAD ENDO STITCH 2 48X1 (ENDOMECHANICALS) ×4
SUT RELOAD ENDO STITCH 2.0 (ENDOMECHANICALS) ×4
SUT SURGIDAC NAB ES-9 0 48 120 (SUTURE) ×6 IMPLANT
SUT VIC AB 2-0 SH 27 (SUTURE) ×1
SUT VIC AB 2-0 SH 27X BRD (SUTURE) ×1 IMPLANT
SUTURE RELOAD END STTCH 2 48X1 (ENDOMECHANICALS) ×4 IMPLANT
SUTURE RELOAD ENDO STITCH 2.0 (ENDOMECHANICALS) ×4 IMPLANT
SYR 10ML ECCENTRIC (SYRINGE) ×2 IMPLANT
SYR 20ML LL LF (SYRINGE) ×4 IMPLANT
TOWEL OR 17X26 10 PK STRL BLUE (TOWEL DISPOSABLE) ×2 IMPLANT
TOWEL OR NON WOVEN STRL DISP B (DISPOSABLE) ×2 IMPLANT
TRAY FOLEY MTR SLVR 16FR STAT (SET/KITS/TRAYS/PACK) ×2 IMPLANT
TROCAR BLADELESS OPT 5 100 (ENDOMECHANICALS) ×2 IMPLANT
TROCAR UNIVERSAL OPT 12M 100M (ENDOMECHANICALS) ×2 IMPLANT
TROCAR XCEL 12X100 BLDLESS (ENDOMECHANICALS) ×2 IMPLANT
TUBING CONNECTING 10 (TUBING) IMPLANT

## 2021-01-13 NOTE — Progress Notes (Signed)

## 2021-01-13 NOTE — Anesthesia Procedure Notes (Signed)
Procedure Name: Intubation Date/Time: 01/13/2021 7:44 AM Performed by: Maxwell Caul, CRNA Pre-anesthesia Checklist: Patient identified, Emergency Drugs available, Suction available and Patient being monitored Patient Re-evaluated:Patient Re-evaluated prior to induction Oxygen Delivery Method: Circle system utilized Preoxygenation: Pre-oxygenation with 100% oxygen Induction Type: IV induction Ventilation: Mask ventilation without difficulty Laryngoscope Size: Mac and 4 Grade View: Grade I Tube type: Oral Tube size: 7.0 mm Number of attempts: 1 Airway Equipment and Method: Stylet Placement Confirmation: ETT inserted through vocal cords under direct vision, positive ETCO2 and breath sounds checked- equal and bilateral Secured at: 22 cm Tube secured with: Tape Dental Injury: Teeth and Oropharynx as per pre-operative assessment

## 2021-01-13 NOTE — Transfer of Care (Signed)
Immediate Anesthesia Transfer of Care Note  Patient: Sabrina White  Procedure(s) Performed: LAPAROSCOPIC ROUX-EN-Y GASTRIC BYPASS WITH UPPER ENDOSCOPY (Abdomen) HERNIA REPAIR HIATAL (Abdomen)  Patient Location: PACU  Anesthesia Type:General  Level of Consciousness: awake, alert  and oriented  Airway & Oxygen Therapy: Patient Spontanous Breathing and Patient connected to face mask oxygen  Post-op Assessment: Report given to RN and Post -op Vital signs reviewed and stable  Post vital signs: Reviewed and stable  Last Vitals:  Vitals Value Taken Time  BP    Temp    Pulse    Resp 18 01/13/21 1025  SpO2    Vitals shown include unvalidated device data.  Last Pain:  Vitals:   01/13/21 0609  TempSrc:   PainSc: 4       Patients Stated Pain Goal: 3 (01/13/21 3817)  Complications: No notable events documented.

## 2021-01-13 NOTE — Discharge Instructions (Signed)

## 2021-01-13 NOTE — Anesthesia Preprocedure Evaluation (Signed)
Anesthesia Evaluation  Patient identified by MRN, date of birth, ID band Patient awake    Reviewed: Allergy & Precautions, H&P , NPO status , Patient's Chart, lab work & pertinent test results  Airway Mallampati: II  TM Distance: >3 FB Neck ROM: Full    Dental  (+) Dental Advisory Given   Pulmonary neg pulmonary ROS,    breath sounds clear to auscultation       Cardiovascular hypertension, negative cardio ROS   Rhythm:Regular Rate:Normal     Neuro/Psych negative neurological ROS     GI/Hepatic Neg liver ROS, hiatal hernia, GERD  ,  Endo/Other  Hypothyroidism Morbid obesity  Renal/GU negative Renal ROS     Musculoskeletal negative musculoskeletal ROS (+)   Abdominal   Peds  Hematology negative hematology ROS (+)   Anesthesia Other Findings   Reproductive/Obstetrics                             Lab Results  Component Value Date   WBC 9.0 01/03/2021   HGB 14.0 01/03/2021   HCT 42.1 01/03/2021   MCV 82.2 01/03/2021   PLT 260 01/03/2021   Lab Results  Component Value Date   CREATININE 0.87 01/03/2021   BUN 22 (H) 01/03/2021   NA 135 01/03/2021   K 3.8 01/03/2021   CL 99 01/03/2021   CO2 27 01/03/2021    Anesthesia Physical Anesthesia Plan  ASA: 3  Anesthesia Plan: General   Post-op Pain Management:    Induction: Intravenous  PONV Risk Score and Plan: 3 and Dexamethasone, Ondansetron, Midazolam, Scopolamine patch - Pre-op and Treatment may vary due to age or medical condition  Airway Management Planned: Oral ETT  Additional Equipment: None  Intra-op Plan:   Post-operative Plan: Extubation in OR  Informed Consent: I have reviewed the patients History and Physical, chart, labs and discussed the procedure including the risks, benefits and alternatives for the proposed anesthesia with the patient or authorized representative who has indicated his/her understanding and  acceptance.     Dental advisory given  Plan Discussed with: CRNA  Anesthesia Plan Comments:         Anesthesia Quick Evaluation

## 2021-01-13 NOTE — Interval H&P Note (Signed)
History and Physical Interval Note:  01/13/2021 6:58 AM  Sabrina White  has presented today for surgery, with the diagnosis of MORBID OBESITY.  The various methods of treatment have been discussed with the patient and family. After consideration of risks, benefits and other options for treatment, the patient has consented to  Procedure(s): LAPAROSCOPIC ROUX-EN-Y GASTRIC BYPASS WITH UPPER ENDOSCOPY (N/A) HERNIA REPAIR HIATAL (N/A) as a surgical intervention.  The patient's history has been reviewed, patient examined, no change in status, stable for surgery.  I have reviewed the patient's chart and labs.  Questions were answered to the patient's satisfaction.     [name]

## 2021-01-13 NOTE — Op Note (Signed)
   Patient: Sabrina White (05-Dec-1985, [id number])  Date of Surgery: 01/13/2021   Preoperative Diagnosis: MORBID OBESITY   Postoperative Diagnosis: MORBID OBESITY   Surgical Procedure: Upper Endoscopy   Surgeon: Ivar Drape, MD  Anesthesiologist: Marcene Duos, MD CRNA: Elisabeth Cara, CRNA; Orest Dikes, CRNA   Anesthesia: General   Fluids:  Total I/O In: 2235 [P.O.:60; I.V.:2075; IV Piggyback:100] Out: 370 [Urine:350; Blood:20]  Complications: None  Drains:  None  Specimen: None   Indications for Procedure: [name] is a 35 y.o. female undergoing laparoscopic gastric bypass and an EGD was requested to evaluate foregut anatomy intraoperatively.  Description of Procedure: During the procedure, I scrubbed out and obtained the Olympus endoscope. I gently placed endoscope in the patient's oropharynx and gently glided it down the esophagus without any difficulty under direct visualization.  The scope was advanced as far as the roux limb and then slowly withdrawn to inspect the foregut anatomy.  Dr. Fredricka Bonine had placed saline in the upper abdomen and all staple lines were submerged to ensure no air leak. There was no evidence of bubbles. There was no evidence of intraluminal bleeding and the mucosa appeared healthy.  The lumen was widely patent without evidence of stricture.  The intraluminal insufflation was decompressed. The scope was withdrawn. The patient tolerated this portion of the procedure well. Please see Dr Derrill Memo operative note for details regarding the remainder of the procedure.    Ivar Drape, MD General, Bariatric, & Minimally Invasive Surgery [facility], [place]

## 2021-01-13 NOTE — Op Note (Signed)
Operative Note  Sabrina White  [id number]  [id number]  01/13/2021   Surgeon: Phylliss Blakes MD   Assistant: Ivar Drape MD   Procedure performed: laparoscopic Roux-en-Y gastric bypass (antecolic, antegastric), hiatal hernia repair upper endoscopy   Preop diagnosis: Morbid obesity Body mass index is 43.6 kg/m., GERD, type 1 hiatal hernia Post-op diagnosis/intraop findings: same   Specimens: none Retained items: none  EBL: minimal cc Complications: none   Description of procedure: After obtaining informed consent and administration of prophylactic heparin in holding, the patient was taken to the operating room and placed supine on the operating room table where general endotracheal anesthesia was initiated, preoperative antibiotics were administered, SCDs applied, and a formal timeout was performed. The abdomen was prepped and draped in usual sterile fashion. Peritoneal access was gained using a Visiport technique in the left upper quadrant and insufflation to 15 mmHg ensued without issue. Gross inspection revealed no evidence of injury. Under direct visualization the remaining trochars were inserted. A laparoscopic assisted bilateral taps block was performed using Exparel mixed with Marcaine.  The omentum was reflected cephalad and the ligament of Treitz identified. The small bowel was followed to a point 50 cm distal to ligament of Treitz at which location the bowel was divided with a white load linear cutting stapler. The bowel was measured another 100 cm distal to this and and the site for the jejunojejunostomy was aligned with the end of the biliopancreatic limb. Enterotomies were made with the Harmonic scalpel and the anastomosis was created with the 60 mm white load linear cutting stapler. The common enterotomy was closed with running 3-0 Vicryl starting on either end and tying centrally. The mesenteric defect was closed with running silk suture secured with Lapra-Ty's. The  anastomosis was inspected and appeared widely patent, hemostatic with no gaps in the suture line. Vistaseal was injected over the anastomosis. We then divided the omentum using the harmonic scalpel.  The patient was then placed in steep reverse Trendelenburg. The liver retractor was inserted through a subxiphoid incision and secured for fixed retraction of the left lobe.  A moderate type I hiatal hernia was identified the proximal stomach herniated into the chest and the hiatus measuring approximately 6 cm anterior-posterior.  Circumferential dissection of the crura ensued using the harmonic scalpel and blunt dissection which allowed the esophagus to mobilize into the abdominal cavity where it rested without tension.  The crura were reapproximated posteriorly with 3 simple interrupted 0 Ethibond sutures secured with tyknots, narrowing the hiatus to approximately 2.5 cm in diameter.   The Harmonic scalpel was then used to enter the perigastric plane and the lesser sac at a point 5 cm distal to the GE junction on the lesser curve. The angle of His was gently bluntly dissected and the target shape of the pouch visualized to exclude any residual fundus. After confirming that all tubes have been removed from the stomach, the gastric pouch was created with serial fires of the linear cutting stapler. As we approached the angle of His, the Ewald tube was inserted to confirm no impingement on the GE junction.  The Roux limb was then identified and brought up to meet the gastric pouch ensuring no twist in the small bowel mesentery. The staple line of the small bowel is directed to the patient's left side. A running 3-0 Vicryl was used to create a posterior suture line for our anastomosis between the gastric pouch and the small bowel. Gastrotomy and enterotomy was made with the Harmonic  scalpel and a blue load linear cutting stapler was used to create a gastrojejunal anastomosis approximately 2.5cm wide. The common  enterotomy was closed with running 3-0 Vicryl starting at either end and tying centrally. At this juncture the Ewald tube was passed through the gastrojejunal anastomosis. An anterior layer of running 3-0 Vicryl was used to complete the gastrojejunal anastomosis. The ewald tube was removed without difficulty. The [place] space was closed with a figure-of-eight silk suture.  At this point the assistant performed an upper endoscopy with the Roux limb gently clamped with a bowel clamp. Irrigation was instilled in the upper abdomen for a leak test. Please see his separate operative note- the anastomosis is noted to be patent and hemostatic without any leak or bubbles present. The endoscope was removed and the abdomen once again surveyed.  Additional Vistaseal was sprayed over the gastrojejunal anastomosis. The liver retractor was removed under direct visualization. The abdomen was then desufflated and all remaining trochars removed. The skin incisions were closed with subcuticular 4-0 Monocryl; benzoin, Steri-Strips and Band-Aids were applied The patient was then awakened, extubated and taken to PACU in stable condition.     All counts were correct at the completion of the case.

## 2021-01-13 NOTE — Progress Notes (Signed)
PHARMACY CONSULT FOR:  Risk Assessment for Post-Discharge VTE Following Bariatric Surgery  Post-Discharge VTE Risk Assessment: This patient's probability of 30-day post-discharge VTE is increased due to the factors marked:   Female    Age >/=60 years    BMI >/=50 kg/m2    CHF    Dyspnea at Rest    Paraplegia  X  Non-gastric-band surgery    Operation Time >/=3 hr    Return to OR     Length of Stay >/= 3 d   Hx of VTE   Hypercoagulable condition   Significant venous stasis    Predicted probability of 30-day post-discharge VTE: 0.16%  Other patient-specific factors to consider: N/A  Recommendation for Discharge: No pharmacologic prophylaxis post-discharge   Sabrina White is a 35 y.o. female who underwent gastric bypass with hiatal hernia repair on 01/13/2021.   Allergies  Allergen Reactions   Codeine Other (See Comments)    Other reaction(s): Dizziness   Nifedipine Nausea And Vomiting    Caused severe headache.   Raspberry Other (See Comments)    Causes blisters on lips    Patient Measurements: Height: _0  (162.6 cm) Weight: 115.2 kg (254 lb) IBW/kg (Calculated) : 54.7 Body mass index is 43.6 kg/m.  Recent Labs    01/13/21 1038  HGB 14.1  HCT 42.6   Estimated Creatinine Clearance: 112.4 mL/min (by C-G formula based on SCr of 0.87 mg/dL).  Past Medical History:  Diagnosis Date   Anxiety    Family history of adverse reaction to anesthesia    Mother has PONV   Family history of breast cancer in mother 04/05/2017   Mother and maternal grandmother, not genetically tested. Paternal aunt, negative BRCA gene tested.   GERD (gastroesophageal reflux disease)    History of hiatal hernia    Hypertension    Hypothyroidism    resolved after pregnancy   PCOS (polycystic ovarian syndrome)     Facility-Administered Medications Prior to Admission  Medication Dose Route Frequency Provider Last Rate Last Admin   0.9 %  sodium chloride infusion  500 mL Intravenous  Once Daryel November, MD       Medications Prior to Admission  Medication Sig Dispense Refill Last Dose   albuterol (VENTOLIN HFA) 108 (90 Base) MCG/ACT inhaler Inhale into the lungs every 6 (six) hours as needed for wheezing or shortness of breath.   Past Week   amLODipine (NORVASC) 10 MG tablet TAKE 1 TABLET(10 MG) BY MOUTH DAILY 90 tablet 3 01/13/2021 at 0410   Famotidine (PEPCID AC MAXIMUM STRENGTH) 20 MG CHEW Chew 1 tablet by mouth daily as needed (For heartburn.).   01/12/2021   hydrochlorothiazide (HYDRODIURIL) 25 MG tablet TAKE 1 TABLET(25 MG) BY MOUTH DAILY 90 tablet 3 01/13/2021 at 0410   pantoprazole (PROTONIX) 40 MG tablet TAKE 1 TABLET(40 MG) BY MOUTH DAILY 30 tablet 3 01/13/2021 at 0410   sertraline (ZOLOFT) 50 MG tablet TAKE 1 TABLET(50 MG) BY MOUTH DAILY (Patient taking differently: Take 25 mg by mouth daily.) 90 tablet 3 01/13/2021 at 0410   ALPRAZolam (XANAX) 0.5 MG tablet Take 0.5-1 tablets (0.25-0.5 mg total) by mouth 2 (two) times daily as needed for anxiety (panic attack). (Patient not taking: Reported on 01/01/2021) 30 tablet 0 More than a month   amoxicillin-clavulanate (AUGMENTIN) 875-125 MG tablet Take 1 tablet by mouth 2 (two) times daily. (Patient not taking: Reported on 01/01/2021) 20 tablet 0 Not Taking   fluconazole (DIFLUCAN) 150 MG tablet Take 1 tablet  by mouth at start of yeast infection, may repeat in 3 days if needed (Patient not taking: Reported on 01/01/2021) 2 tablet 0 Not Taking   lidocaine (XYLOCAINE) 2 % solution Use as directed 15 mLs in the mouth or throat every 4 (four) hours as needed for mouth pain. (Patient not taking: Reported on 01/01/2021) 200 mL 0 Not Taking   sucralfate (CARAFATE) 1 g tablet Take 1 tablet (1 g total) by mouth 4 (four) times daily -  with meals and at bedtime. (Patient not taking: Reported on 01/01/2021) 120 tablet 5 Not Taking    Kelijah Towry A Lerlene Treadwell 01/13/2021,12:49 PM

## 2021-01-13 NOTE — Anesthesia Postprocedure Evaluation (Signed)
Anesthesia Post Note  Patient: Sabrina White  Procedure(s) Performed: LAPAROSCOPIC ROUX-EN-Y GASTRIC BYPASS WITH UPPER ENDOSCOPY (Abdomen) HERNIA REPAIR HIATAL (Abdomen)     Patient location during evaluation: PACU Anesthesia Type: General Level of consciousness: awake and alert Pain management: pain level controlled Vital Signs Assessment: post-procedure vital signs reviewed and stable Respiratory status: spontaneous breathing, nonlabored ventilation, respiratory function stable and patient connected to nasal cannula oxygen Cardiovascular status: blood pressure returned to baseline and stable Postop Assessment: no apparent nausea or vomiting Anesthetic complications: no   No notable events documented.  Last Vitals:  Vitals:   01/13/21 1336 01/13/21 1440  BP: 111/67 117/69  Pulse: 81 86  Resp: 14 15  Temp: 36.8 C 36.5 C  SpO2: 91% 92%    Last Pain:  Vitals:   01/13/21 1440  TempSrc: Oral  PainSc:                  Kennieth Rad

## 2021-01-14 ENCOUNTER — Encounter (HOSPITAL_COMMUNITY): Payer: Self-pay | Admitting: Surgery

## 2021-01-14 ENCOUNTER — Other Ambulatory Visit: Payer: Self-pay

## 2021-01-14 ENCOUNTER — Other Ambulatory Visit (HOSPITAL_COMMUNITY): Payer: Self-pay

## 2021-01-14 LAB — COMPREHENSIVE METABOLIC PANEL
ALT: 40 U/L (ref 0–44)
AST: 28 U/L (ref 15–41)
Albumin: 3.9 g/dL (ref 3.5–5.0)
Alkaline Phosphatase: 43 U/L (ref 38–126)
Anion gap: 6 (ref 5–15)
BUN: 14 mg/dL (ref 6–20)
CO2: 28 mmol/L (ref 22–32)
Calcium: 8.7 mg/dL — ABNORMAL LOW (ref 8.9–10.3)
Chloride: 104 mmol/L (ref 98–111)
Creatinine, Ser: 0.69 mg/dL (ref 0.44–1.00)
GFR, Estimated: >60 mL/min (ref >60)
Glucose, Bld: 107 mg/dL — ABNORMAL HIGH (ref 70–99)
Potassium: 4.1 mmol/L (ref 3.5–5.1)
Sodium: 138 mmol/L (ref 135–145)
Total Bilirubin: 0.7 mg/dL (ref 0.3–1.2)
Total Protein: 7 g/dL (ref 6.5–8.1)

## 2021-01-14 LAB — CBC WITH DIFFERENTIAL/PLATELET
Abs Immature Granulocytes: 0.03 10*3/uL (ref 0.00–0.07)
Basophils Absolute: 0 10*3/uL (ref 0.0–0.1)
Basophils Relative: 0 %
Eosinophils Absolute: 0 10*3/uL (ref 0.0–0.5)
Eosinophils Relative: 0 %
HCT: 39.2 % (ref 36.0–46.0)
Hemoglobin: 13.4 g/dL (ref 12.0–15.0)
Immature Granulocytes: 0 %
Lymphocytes Relative: 15 %
Lymphs Abs: 1.7 10*3/uL (ref 0.7–4.0)
MCH: 27.9 pg (ref 26.0–34.0)
MCHC: 34.2 g/dL (ref 30.0–36.0)
MCV: 81.7 fL (ref 80.0–100.0)
Monocytes Absolute: 0.4 10*3/uL (ref 0.1–1.0)
Monocytes Relative: 4 %
Neutro Abs: 8.9 10*3/uL — ABNORMAL HIGH (ref 1.7–7.7)
Neutrophils Relative %: 81 %
Platelets: 246 10*3/uL (ref 150–400)
RBC: 4.8 MIL/uL (ref 3.87–5.11)
RDW: 13.2 % (ref 11.5–15.5)
WBC: 11 10*3/uL — ABNORMAL HIGH (ref 4.0–10.5)
nRBC: 0 % (ref 0.0–0.2)

## 2021-01-14 LAB — MAGNESIUM: Magnesium: 2.2 mg/dL (ref 1.7–2.4)

## 2021-01-14 MED ORDER — SERTRALINE HCL 50 MG PO TABS: 25.0000 mg | ORAL_TABLET | Freq: Every day | ORAL | Status: DC

## 2021-01-14 MED ORDER — TRAMADOL HCL 50 MG PO TABS
50.0000 mg | ORAL_TABLET | Freq: Four times a day (QID) | ORAL | 0 refills | Status: DC | PRN
  Filled 2021-01-14: qty 10, 3d supply, fill #0

## 2021-01-14 MED ORDER — PANTOPRAZOLE SODIUM 40 MG PO TBEC
40.0000 mg | DELAYED_RELEASE_TABLET | Freq: Every day | ORAL | 0 refills | Status: DC
  Filled 2021-01-14: qty 90, 90d supply, fill #0

## 2021-01-14 MED ORDER — GABAPENTIN 100 MG PO CAPS
200.0000 mg | ORAL_CAPSULE | Freq: Two times a day (BID) | ORAL | 0 refills | Status: DC
  Filled 2021-01-14: qty 20, 5d supply, fill #0

## 2021-01-14 MED ORDER — ONDANSETRON 4 MG PO TBDP
4.0000 mg | ORAL_TABLET | Freq: Four times a day (QID) | ORAL | 0 refills | Status: DC | PRN
  Filled 2021-01-14: qty 18, 21d supply, fill #0

## 2021-01-14 MED ORDER — ACETAMINOPHEN 500 MG PO TABS: 1000.0000 mg | ORAL_TABLET | Freq: Three times a day (TID) | ORAL | 0 refills | Status: AC

## 2021-01-14 NOTE — Progress Notes (Signed)
24hr fluid recall: 720mL.  Per dehydration protocol, will call pt to f/u within one week post op. 

## 2021-01-14 NOTE — Progress Notes (Signed)
S: No acute events, denies significant pain or nausea.  Some epigastric pain when swallowing but this passes quickly.  Walking and tolerating liquids and protein shakes well.  O: Vitals, labs, intake/output, and orders reviewed at this time.  Afebrile, no tachycardia, normo to mildly hypertensive.  P.o. 720, uop 4150.  CMP unremarkable, WBC 11 (9 preop), hemoglobin 13.4 (14)   Gen: A&Ox3, no distress  H&N: EOMI, atraumatic, neck supple Chest: unlabored respirations, RRR Abd: soft, nontender, nondistended, incision(s) c/d/i with Steri-Strips and Band-Aids, no cellulitis or hematoma Ext: warm, no edema Neuro: grossly normal  Lines/tubes/drains: piv  A/P: Postop day 1 status post laparoscopic Roux-en-Y gastric bypass, hiatal hernia repair -Continue clear liquids and protein shakes -Continue SCDs while in bed, prophylactic Lovenox -Aggressive ambulation, pulmonary toilet -Discharge today   Phylliss Blakes, MD [facility], [place]

## 2021-01-14 NOTE — Progress Notes (Signed)
Patient was given discharge instructions, and all questions were answered.  Patient was stable for discharge and was taken to the main exit by wheelchair. 

## 2021-01-14 NOTE — Discharge Summary (Signed)
Physician Discharge Summary  Sabrina White HKV:[id number] DOB: 1985/08/27 DOA: 01/13/2021  PCP: Willow Ora, MD  Admit date: 01/13/2021 Discharge date: 01/14/2021  Recommendations for Outpatient Follow-up:     Follow-up Information     Berna Bue, MD. Go on 02/06/2021.   Specialty: General Surgery Why: at 11:30am.  Please arrive 15 minutes prior to your appointment time.  Thank you Contact information: [address] [place] [address] [phone]         Surgery, [place]. Go on 03/06/2021.   Specialty: General Surgery Why: at 9:20am with Dr. Fredricka Bonine.  Please arrive 15 minutes prior to your appointment time.  Thank you. Contact information: [address] [place] [address] [phone]                Discharge Diagnoses:  Principal Problem:   Morbid obesity (HCC)   Surgical Procedure: Laparoscopic Roux-en-Y gastric bypass, upper endoscopy  Discharge Condition: Good Disposition: Home  Diet recommendation: Postoperative gastric bypass diet  Filed Weights   01/13/21 0526 01/13/21 0601  Weight: 117.9 kg 115.2 kg     Hospital Course:  The patient was admitted for a planned laparoscopic Roux-en-Y gastric bypass. Please see operative note. Preoperatively the patient was given 5000 units of subcutaneous heparin for DVT prophylaxis. ERAS protocol was used. Postoperative prophylactic Lovenox dosing was started on the evening of postoperative day 0.  The patient was started on ice chips and water on the evening of POD 0 which they tolerated. On postoperative day 1 The patient's diet was advanced to protein shakes which they also tolerated.  The patient was ambulating without difficulty. Their vital signs are stable without fever or tachycardia. Their hemoglobin had remained stable.  The patient had received discharge instructions and counseling. They were deemed stable for discharge.    Discharge  Instructions   Allergies as of 01/14/2021       Reactions   Codeine Other (See Comments)   Other reaction(s): Dizziness   Nifedipine Nausea And Vomiting   Caused severe headache.   Raspberry Other (See Comments)   Causes blisters on lips        Medication List     STOP taking these medications    ALPRAZolam 0.5 MG tablet Commonly known as: Xanax   amoxicillin-clavulanate 875-125 MG tablet Commonly known as: AUGMENTIN   fluconazole 150 MG tablet Commonly known as: Diflucan   hydrochlorothiazide 25 MG tablet Commonly known as: HYDRODIURIL   lidocaine 2 % solution Commonly known as: XYLOCAINE   Pepcid AC Maximum Strength 20 MG Chew Generic drug: Famotidine   sucralfate 1 g tablet Commonly known as: Carafate       TAKE these medications    acetaminophen 500 MG tablet Commonly known as: TYLENOL Take 2 tablets (1,000 mg total) by mouth every 8 (eight) hours for 5 days.   albuterol 108 (90 Base) MCG/ACT inhaler Commonly known as: VENTOLIN HFA Inhale into the lungs every 6 (six) hours as needed for wheezing or shortness of breath.   amLODipine 10 MG tablet Commonly known as: NORVASC TAKE 1 TABLET(10 MG) BY MOUTH DAILY Notes to patient: Monitor Blood Pressure Daily and keep a log for primary care physician.  You may need to make changes to your medications with rapid weight loss.     gabapentin 100 MG capsule Commonly known as: NEURONTIN Take 2 capsules (200 mg total) by mouth every 12 (twelve) hours.   ondansetron 4 MG disintegrating tablet  Commonly known as: ZOFRAN-ODT Dissolve 1 tablet (4 mg total) by mouth every 6 (six) hours as needed for nausea or vomiting.   pantoprazole 40 MG tablet Commonly known as: PROTONIX Take 1 tablet (40 mg total) by mouth daily. What changed: See the new instructions. Notes to patient: You will remain on this medication daily for at least the first few months after gastric bypass.    sertraline 50 MG tablet Commonly  known as: ZOLOFT Take 0.5 tablets (25 mg total) by mouth daily. What changed: See the new instructions.   traMADol 50 MG tablet Commonly known as: ULTRAM Take 1 tablet (50 mg total) by mouth every 6 (six) hours as needed (pain).        Follow-up Information     Berna Bue, MD. Go on 02/06/2021.   Specialty: General Surgery Why: at 11:30am.  Please arrive 15 minutes prior to your appointment time.  Thank you Contact information: [address] [place] [address] [phone]         Surgery, [place]. Go on 03/06/2021.   Specialty: General Surgery Why: at 9:20am with Dr. Fredricka Bonine.  Please arrive 15 minutes prior to your appointment time.  Thank you. Contact information: [address] [place] [address] [phone]                  The results of significant diagnostics from this hospitalization (including imaging, microbiology, ancillary and laboratory) are listed below for reference.    Significant Diagnostic Studies: No results found.  Labs: Basic Metabolic Panel: Recent Labs  Lab 01/14/21 0406  NA 138  K 4.1  CL 104  CO2 28  GLUCOSE 107*  BUN 14  CREATININE 0.69  CALCIUM 8.7*  MG 2.2   Liver Function Tests: Recent Labs  Lab 01/14/21 0406  AST 28  ALT 40  ALKPHOS 43  BILITOT 0.7  PROT 7.0  ALBUMIN 3.9    CBC: Recent Labs  Lab 01/13/21 1038 01/14/21 0406  WBC  --  11.0*  NEUTROABS  --  8.9*  HGB 14.1 13.4  HCT 42.6 39.2  MCV  --  81.7  PLT  --  246    CBG: No results for input(s): GLUCAP in the last 168 hours.  Principal Problem:   Morbid obesity (HCC)    Signed:  Berna Bue MD [facility], [place] [phone] 01/14/2021, 11:19 AM

## 2021-01-14 NOTE — Progress Notes (Signed)
Patient alert and oriented, pain is controlled. Patient is tolerating fluids, advanced to protein shake today, patient is tolerating well. Reviewed Gastric Bypass discharge instructions with patient and patient is able to articulate understanding. Provided information on BELT program, Support Group and [facility] outpatient pharmacy. All questions answered, will continue to monitor.    

## 2021-01-20 ENCOUNTER — Telehealth (HOSPITAL_COMMUNITY): Payer: Self-pay | Admitting: *Deleted

## 2021-01-20 NOTE — Telephone Encounter (Signed)
1.  Tell me about your pain and pain management? Pt denies any pain.  2.  Let's talk about fluid intake.  How much total fluid are you taking in? Pt states that she is getting in more than 64oz of fluid including protein shakes, bottled water, and broth. Pt instructed to assess status and suggestions daily utilizing Hydration Action Plan on discharge folder and to call [facility] if in the "red zone".   3.  How much protein have you taken in the last 2 days? Pt states she is meeting her goal of 60g of protein each day with the protein shakes.  4.  Have you had nausea?  Tell me about when have experienced nausea and what you did to help? Pt denies nausea.   5.  Has the frequency or color changed with your urine? Pt states that she is urinating "fine" with no changes in frequency or urgency.     6.  Tell me what your incisions look like? "Incisions look fine". Pt denies a fever, chills.  Pt states incisions are not swollen, open, or draining.  Pt encouraged to call [facility] if incisions change.   7.  Have you been passing gas? BM? Pt states that she is having BMs. Last BM 01/18/21.    8.  If a problem or question were to arise who would you call?  Do you know contact numbers for [facility], [facility], and [facility]? Pt denies dehydration symptoms.  Pt can describe s/sx of dehydration.  Pt knows to call [facility] for surgical, [facility] for nutrition, and [facility] for non-urgent questions or concerns.   9.  How has the walking going? Pt states she is walking around and able to be active without difficulty.   10. Are you still using your incentive spirometer?  If so, how often? Pt states that she is doing I.S. 10-15x/day. Pt encouraged to use incentive spirometer, at least 10x every hour while awake until she sees the surgeon.  11.  How are your vitamins and calcium going?  How are you taking them? Pt states that she is taking her multivitamin without difficulty, but has not taken calcium 3x/day consistently.  Pt instructed to use  reminders on phone to make sure to meet calcium requirements daily.  Reminded patient that the first 30 days post-operatively are important for successful recovery.  Practice good hand hygiene, wearing a mask when appropriate (since optional in most places), and minimizing exposure to people who live outside of the home, especially if they are exhibiting any respiratory, GI, or illness-like symptoms.

## 2021-01-28 ENCOUNTER — Other Ambulatory Visit: Payer: Self-pay

## 2021-01-28 ENCOUNTER — Encounter: Payer: BC Managed Care – PPO | Attending: Surgery | Admitting: Dietician

## 2021-01-28 NOTE — Progress Notes (Signed)
  Post op nutrition class:   2 weeks post-op RnY Gastric Bypass Surgery  Start time: 1100 end time:  1130  Anthropometrics:  Date 12/16/20 01/28/21  BMI 43.93 39.7  Weight (lbs) 264 240.3  Skeletal muscle (lbs)  33.5  % body fat  42.8    Instruction:   Diet advancement to include soft, solid protein foods. Importance of slow eating, small bites, chewing foods thoroughly. Avoiding fluids 15 minutes before, during, and 30 minutes after eating. Allowing 30 minutes for each meal. Importance of eating a meal or snack every 3-4 hours during the day.  Gradually reducing protein shakes as intake of solids increases. Advised use of tracking tool to monitor protein and fluid intake.  Reviewed vitamin and mineral supplementation. Gradually increasing light physical activity as tolerated and cleared by surgeon.  Teaching Method Utilized:  Visual Auditory Hands on  Materials provided: Phase 3 Food Plan (Protein only) Protein meal and snack ideas   Plan: Return for individual MNT visit at 2 months post-op.

## 2021-02-03 ENCOUNTER — Telehealth: Payer: Self-pay | Admitting: Skilled Nursing Facility1

## 2021-02-03 NOTE — Telephone Encounter (Signed)
RD called pt to verify fluid intake once starting soft, solid proteins 2 week post-bariatric surgery.   Daily Fluid intake: 64 oz Daily Protein intake: 60g  Bowel Habits: every day to every other day  Concerns/issues:   Pt states she realized chicken is not going well sos he is going to avoid.

## 2021-02-10 ENCOUNTER — Ambulatory Visit: Payer: BC Managed Care – PPO | Admitting: Family Medicine

## 2021-02-12 DIAGNOSIS — K449 Diaphragmatic hernia without obstruction or gangrene: Secondary | ICD-10-CM | POA: Insufficient documentation

## 2021-02-19 ENCOUNTER — Other Ambulatory Visit: Payer: Self-pay | Admitting: Obstetrics

## 2021-02-19 DIAGNOSIS — Z803 Family history of malignant neoplasm of breast: Secondary | ICD-10-CM

## 2021-03-12 ENCOUNTER — Other Ambulatory Visit: Payer: Self-pay

## 2021-03-12 ENCOUNTER — Encounter: Payer: BC Managed Care – PPO | Attending: Surgery | Admitting: Skilled Nursing Facility1

## 2021-03-12 NOTE — Progress Notes (Signed)
Bariatric Nutrition Follow-Up Visit Medical Nutrition Therapy   NUTRITION ASSESSMENT    Anthropometrics  Surgery date: 01/13/2021 Surgery type: RYGB Start weight at [facility]: 266.4 Weight today: 226.1 pounds  Body Composition Scale 03/12/2021  Weight  lbs 226.1  Total Body Fat  % 41.1     Visceral Fat 11  Fat-Free Mass  % 58.8     Total Body Water  % 43.9     Muscle-Mass  lbs 33.4  BMI 37.3  Body Fat Displacement ---        Torso  lbs 57.6        Left Leg  lbs 11.5        Right Leg  lbs 11.5        Left Arm  lbs 5.7        Right Arm  lbs 5.7   Clinical  Medical hx: GERD, PCOS, HTN Medications: off all medication  Labs:  Notable signs/symptoms: N/A Any previous deficiencies? No   Lifestyle & Dietary Hx  Pt states her wife is very supportive and currently pregnant.  Pt states they typically will add olive oil and seasonings to their veggies.   Estimated daily fluid intake: 50 oz Estimated daily protein intake: 60 g Supplements: multi and calcium Current average weekly physical activity: 3-4 days a week 30 minutes    24-Hr Dietary Recall First Meal: cafe latte premier protein  Snack:  boiled egg Second Meal: [place] + cheese stick or crustless quiche  Snack:  cottage cheese Third Meal: salmon Snack:  Beverages: water, water + flavoring  Post-Op Goals/ Signs/ Symptoms Using straws: no Drinking while eating: no Chewing/swallowing difficulties: no Changes in vision: no Changes to mood/headaches: no Hair loss/changes to skin/nails: no Difficulty focusing/concentrating: no Sweating: no Limb weakness: no Dizziness/lightheadedness: no Palpitations: no  Carbonated/caffeinated beverages: no N/V/D/C/Gas: no Abdominal pain: no Dumping syndrome: no    NUTRITION DIAGNOSIS  Overweight/obesity ([place]-3.3) related to past poor dietary habits and physical inactivity as evidenced by completed bariatric surgery and following dietary guidelines for continued weight loss and  healthy nutrition status.     NUTRITION INTERVENTION Nutrition counseling (C-1) and education (E-2) to facilitate bariatric surgery goals, including: Diet advancement to the next phase (phase 4) now including non starchy vegetables  The importance of consuming adequate calories as well as certain nutrients daily due to the body's need for essential vitamins, minerals, and fats The importance of daily physical activity and to reach a goal of at least 150 minutes of moderate to vigorous physical activity weekly (or as directed by their physician) due to benefits such as increased musculature and improved lab values The importance of intuitive eating specifically learning hunger-satiety cues and understanding the importance of learning a new body: The importance of mindful eating to avoid grazing behaviors   Goals: -Continue to aim for a minimum of 64 fluid ounces 7 days a week with at least 30 ounces being plain water  -Eat non-starchy vegetables 2 times a day 7 days a week  -Start out with soft cooked vegetables today and tomorrow; if tolerated begin to eat raw vegetables or cooked including salads  -Eat your 3 ounces of protein first then start in on your non-starchy vegetables; once you understand how much of your meal leads to satisfaction and not full while still eating 3 ounces of protein and non-starchy vegetables you can eat them in any order   -Continue to aim for 30 minutes of activity at least 5 times a week  -  Do NOT cook with/add to your food: alfredo sauce, cheese sauce, barbeque sauce, ketchup, fat back, butter, bacon grease, grease, Crisco, OR SUGAR   Handouts Provided Include  Phase 4  Learning Style & Readiness for Change Teaching method utilized: Visual & Auditory  Demonstrated degree of understanding via: Teach Back  Readiness Level: action Barriers to learning/adherence to lifestyle change: none identified  RD's Notes for Next Visit Assess adherence to pt chosen  goals     MONITORING & EVALUATION Dietary intake, weekly physical activity, body weight  Next Steps Patient is to follow-up in March

## 2021-04-27 ENCOUNTER — Encounter: Payer: Self-pay | Admitting: Family Medicine

## 2021-04-30 ENCOUNTER — Ambulatory Visit: Payer: BC Managed Care – PPO | Admitting: Family Medicine

## 2021-04-30 ENCOUNTER — Encounter: Payer: Self-pay | Admitting: Family Medicine

## 2021-04-30 VITALS — BP 114/82 | HR 100 | Temp 98.3°F | Ht 65.0 in | Wt 212.6 lb

## 2021-04-30 DIAGNOSIS — Z8679 Personal history of other diseases of the circulatory system: Secondary | ICD-10-CM

## 2021-04-30 DIAGNOSIS — F411 Generalized anxiety disorder: Secondary | ICD-10-CM | POA: Diagnosis not present

## 2021-04-30 DIAGNOSIS — L539 Erythematous condition, unspecified: Secondary | ICD-10-CM

## 2021-04-30 DIAGNOSIS — Z9884 Bariatric surgery status: Secondary | ICD-10-CM | POA: Diagnosis not present

## 2021-04-30 HISTORY — DX: Bariatric surgery status: Z98.84

## 2021-04-30 LAB — COMPREHENSIVE METABOLIC PANEL
ALT: 20 U/L (ref 0–35)
AST: 18 U/L (ref 0–37)
Albumin: 4.6 g/dL (ref 3.5–5.2)
Alkaline Phosphatase: 69 U/L (ref 39–117)
BUN: 16 mg/dL (ref 6–23)
CO2: 32 mEq/L (ref 19–32)
Calcium: 10.1 mg/dL (ref 8.4–10.5)
Chloride: 93 mEq/L — ABNORMAL LOW (ref 96–112)
Creatinine, Ser: 0.82 mg/dL (ref 0.40–1.20)
GFR: 92.51 mL/min (ref >60.00)
Glucose, Bld: 91 mg/dL (ref 70–99)
Potassium: 3.6 mEq/L (ref 3.5–5.1)
Sodium: 136 mEq/L (ref 135–145)
Total Bilirubin: 0.6 mg/dL (ref 0.2–1.2)
Total Protein: 7.7 g/dL (ref 6.0–8.3)

## 2021-04-30 LAB — CBC WITH DIFFERENTIAL/PLATELET
Basophils Absolute: 0.1 10*3/uL (ref 0.0–0.1)
Basophils Relative: 0.7 % (ref 0.0–3.0)
Eosinophils Absolute: 0.2 10*3/uL (ref 0.0–0.7)
Eosinophils Relative: 2.2 % (ref 0.0–5.0)
HCT: 45.6 % (ref 36.0–46.0)
Hemoglobin: 15.4 g/dL — ABNORMAL HIGH (ref 12.0–15.0)
Lymphocytes Relative: 25.4 % (ref 12.0–46.0)
Lymphs Abs: 2.8 10*3/uL (ref 0.7–4.0)
MCHC: 33.7 g/dL (ref 30.0–36.0)
MCV: 81.9 fl (ref 78.0–100.0)
Monocytes Absolute: 0.7 10*3/uL (ref 0.1–1.0)
Monocytes Relative: 6.6 % (ref 3.0–12.0)
Neutro Abs: 7.2 10*3/uL (ref 1.4–7.7)
Neutrophils Relative %: 65.1 % (ref 43.0–77.0)
Platelets: 298 10*3/uL (ref 150.0–400.0)
RBC: 5.56 Mil/uL — ABNORMAL HIGH (ref 3.87–5.11)
RDW: 14.1 % (ref 11.5–15.5)
WBC: 11.1 10*3/uL — ABNORMAL HIGH (ref 4.0–10.5)

## 2021-04-30 LAB — VITAMIN D 25 HYDROXY (VIT D DEFICIENCY, FRACTURES): VITD: 36.43 ng/mL (ref 30.00–100.00)

## 2021-04-30 LAB — TSH: TSH: 2.11 u[IU]/mL (ref 0.35–5.50)

## 2021-04-30 LAB — SEDIMENTATION RATE: Sed Rate: 36 mm/hr — ABNORMAL HIGH (ref 0–20)

## 2021-04-30 LAB — VITAMIN B12: Vitamin B-12: 498 pg/mL (ref 211–911)

## 2021-04-30 MED ORDER — ALPRAZOLAM 0.5 MG PO TABS: 0.2500 mg | ORAL_TABLET | Freq: Two times a day (BID) | ORAL | 0 refills | Status: DC | PRN

## 2021-04-30 MED ORDER — SERTRALINE HCL 25 MG PO TABS: ORAL_TABLET | ORAL | 1 refills | Status: DC

## 2021-04-30 NOTE — Patient Instructions (Signed)
Please return in 1 week for recheck blood pressure and follow up ? ?Stop the blood pressure medications.  ?Restart zoloft; use xanax as needed.  ?Start zyrtec nightly for a week.  ? ?I will release your lab results to you on your MyChart account with further instructions. You may see the results before I do, but when I review them I will send you a message with my report or have my assistant call you if things need to be discussed. Please reply to my message with any questions. Thank you!  ? ?If you have any questions or concerns, please don't hesitate to send me a message via MyChart or call the office at [phone]. Thank you for visiting with [place] today! It's our pleasure caring for you.  ?

## 2021-04-30 NOTE — Progress Notes (Signed)
Subjective  CC:  Chief Complaint  Patient presents with   Hypertension    Pt says that Dystolic number has been elevated. She states feeling very bad. She complains of constant dull headaches, and tension headaches. She has taken both Amlodipine and HCTZ this morning.    Leg Problem    Started yesterday. She complains of redness and denies pain or swelling.     HPI: Sabrina White is a 36 y.o. female who presents to the office today to address the problems listed above in the chief complaint. 36 year old female status post gastric bypass surgery in November, down 60 pounds, reports that about 2 weeks ago she started feeling poorly, feeling fatigued and having headaches.  She thought her blood pressure could be the cause and home readings showed elevated diastolics.  She has been off amlodipine HCTZ since the surgery and multiple follow-up appointments at the surgeon's office have shown normal blood pressures.  She had no chest pain, shortness of breath.  She restarted her amlodipine and HCTZ however, home blood pressure cuff remains showing elevated readings.  Today she is feeling better, however yesterday she noted a red rash on bilateral upper and lower extremities.  It is not itchy.  Is not painful.  She has no shortness of breath or hives. History of anxiety disorder on Zoloft.  She stopped this in November as well.  She felt she was stable.  However, during our interview, it is apparent that anxiety symptoms are back.  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  This is the last 2 weeks of her course.  Sleep has been difficult.  She did take Xanax yesterday and that helped how she felt.  She has history of chronic functional general anxiety that came to ahead last year after the birth of her son.  She did have panic attacks at that time and she is done well on  Xanax Zoloft since.  Assessment  1. History of hypertension   2. GAD (generalized anxiety disorder)   3. 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 Erythema of skin      Plan   Hypertension f/u: BP control is normal in our office today.  Not sure that her home blood pressure cuff is accurate.  I recommend stopping blood pressure medicines due to the red rash: It seems this could be allergic to either HCTZ or amlodipine.  No other allergens have been identified today.  She will follow-up here in 1 week and we will recheck her blood pressure at that time. Erythema: Likely allergic reaction.  Check labs for other causes. Gastric bypass status: Check for vitamin deficiencies and anemia Generalized anxiety disorder: Had long discussion.  Likely stopped medications prematurely.  Will restart Zoloft and follow closely.  Refill Xanax as needed.  Patient understands and agrees with care plan  Education regarding management of these chronic disease states was given. Management strategies discussed on successive visits include dietary and exercise recommendations, goals of achieving and maintaining IBW, and lifestyle modifications aiming for adequate sleep and minimizing stressors.   Follow up: 1 week for recheck  Orders Placed This Encounter  Procedures   CBC with Differential/Platelet   Comprehensive metabolic panel   Sedimentation rate   TSH   Vitamin B12   VITAMIN D 25 Hydroxy (Vit-D Deficiency, Fractures)   Meds ordered  this encounter  Medications   sertraline (ZOLOFT) 25 MG tablet    Sig: Take 1 tablet (25 mg total) by mouth daily for 14 days, THEN 2 tablets (50 mg total) daily for 14 days.    Dispense:  42 tablet    Refill:  1   ALPRAZolam (XANAX) 0.5 MG tablet    Sig: Take 0.5-1 tablets (0.25-0.5 mg total) by mouth 2 (two) times daily as needed for anxiety (panic attack).    Dispense:  30 tablet    Refill:  0      BP Readings from Last 3 Encounters:  04/30/21 114/82   01/14/21 129/87  01/03/21 (!) 134/95   Wt Readings from Last 3 Encounters:  04/30/21 212 lb 9.6 oz (96.4 kg)  03/12/21 226 lb 1.6 oz (102.6 kg)  01/28/21 240 lb 4.8 oz (109 kg)    Lab Results  Component Value Date   CHOL 206 (H) 01/31/2020   CHOL 185 04/05/2017   Lab Results  Component Value Date   HDL 34 (L) 01/31/2020   HDL 31.50 (L) 04/05/2017   Lab Results  Component Value Date   LDLCALC 140 (H) 01/31/2020   Lab Results  Component Value Date   TRIG 188 (H) 01/31/2020   TRIG 211.0 (H) 04/05/2017   Lab Results  Component Value Date   CHOLHDL 6.1 (H) 01/31/2020   CHOLHDL 6 04/05/2017   Lab Results  Component Value Date   LDLDIRECT 117.0 04/05/2017   Lab Results  Component Value Date   CREATININE 0.69 01/14/2021   BUN 14 01/14/2021   NA 138 01/14/2021   K 4.1 01/14/2021   CL 104 01/14/2021   CO2 28 01/14/2021    The ASCVD Risk score (Arnett DK, et al., 2019) failed to calculate for the following reasons:   The 2019 ASCVD risk score is only valid for ages 61 to 65  I reviewed the patients updated PMH, FH, and SocHx.    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 She has never used smokeless tobacco. She reports current alcohol use. She reports that she does not use drugs.  Current Meds  Medication Sig   sertraline (ZOLOFT) 25 MG tablet Take 1 tablet (25 mg total) by  mouth daily for 14 days, THEN 2 tablets (50 mg total) daily for 14 days.   [DISCONTINUED] amLODipine (NORVASC) 10 MG tablet TAKE 1 TABLET(10 MG) BY MOUTH DAILY    Review of Systems: Cardiovascular: negative for chest pain, palpitations, leg swelling, orthopnea Respiratory: negative for SOB, wheezing or persistent cough Gastrointestinal: negative for abdominal pain Genitourinary: negative for dysuria or gross hematuria  Objective  Vitals: BP 114/82    Pulse 100    Temp 98.3 F (36.8 C) (Temporal)    Ht 5\' 5"  (1.651 m)    Wt 212 lb 9.6 oz (96.4 kg)    SpO2 100%    BMI 35.38 kg/m  General: no acute distress, appears  well Psych:  Alert and oriented, normal mood and affect HEENT:  Normocephalic, atraumatic, supple neck  Cardiovascular:  RRR without murmur. no edema Respiratory:  Good breath sounds bilaterally, CTAB with normal respiratory effort Skin:  Warm, erythema, macular, diffuse over bilateral upper and lower extremities.  Some mild chest.  Minimal on torso.  No palmar or plantar erythema.  No urticaria or vesicles.  Nonblanchable, nontender Neurologic:   Mental status is normal Commons side effects, risks, benefits, and alternatives for medications and treatment plan prescribed today were discussed, and the patient expressed understanding of the given instructions. Patient is instructed to call or message via MyChart if he/she has any questions or concerns regarding our treatment plan. No barriers to understanding were identified. We discussed Red Flag symptoms and signs in detail. Patient expressed understanding regarding what to do in case of urgent or emergency type symptoms.  Medication list was reconciled, printed and provided to the patient in AVS. Patient instructions and summary information was reviewed with the patient as documented in the AVS. This note was prepared with assistance of Dragon voice recognition software. Occasional wrong-word or sound-a-like substitutions may have  occurred due to the inherent limitations of voice recognition software  This visit occurred during the SARS-CoV-2 public health emergency.  Safety protocols were in place, including screening questions prior to the visit, additional usage of staff PPE, and extensive cleaning of exam room while observing appropriate contact time as indicated for disinfecting solutions.

## 2021-05-12 ENCOUNTER — Ambulatory Visit: Payer: BC Managed Care – PPO | Admitting: Family Medicine

## 2021-05-14 ENCOUNTER — Encounter: Payer: Self-pay | Admitting: Family Medicine

## 2021-05-14 ENCOUNTER — Encounter: Payer: BC Managed Care – PPO | Attending: Surgery | Admitting: Skilled Nursing Facility1

## 2021-05-14 ENCOUNTER — Other Ambulatory Visit: Payer: Self-pay

## 2021-05-14 ENCOUNTER — Ambulatory Visit (INDEPENDENT_AMBULATORY_CARE_PROVIDER_SITE_OTHER): Payer: BC Managed Care – PPO | Admitting: Family Medicine

## 2021-05-14 VITALS — BP 116/72 | HR 67 | Temp 97.7°F | Ht 65.0 in | Wt 212.8 lb

## 2021-05-14 DIAGNOSIS — F411 Generalized anxiety disorder: Secondary | ICD-10-CM

## 2021-05-14 DIAGNOSIS — F5102 Adjustment insomnia: Secondary | ICD-10-CM | POA: Diagnosis not present

## 2021-05-14 DIAGNOSIS — E669 Obesity, unspecified: Secondary | ICD-10-CM | POA: Diagnosis present

## 2021-05-14 DIAGNOSIS — Z8679 Personal history of other diseases of the circulatory system: Secondary | ICD-10-CM

## 2021-05-14 NOTE — Progress Notes (Signed)
? ?Subjective  ?CC:  ?Chief Complaint  ?Patient presents with  ? Hypertension  ?  She denies headaches and dizziness.   ? ? ?HPI: Sabrina White is a 36 y.o. female who presents to the office today to address the problems listed above in the chief complaint. ?See note from last week.  Follow-up due to multiple symptoms including malaise, headaches and rash secondary to her antihypertensives.  We stopped the antihypertensives and started treatment again for anxiety disorder.  Her rash is completely resolved.  She no longer is feeling poorly.  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  She did restart her Zoloft and is tolerating that well.  She has noticed some problems with fear about staying asleep.  Intermittently she will use half a Xanax to help her.  She wakens with her mind racing.  No long-term problems with insomnia.  She has had adjustment insomnia and did not tolerate Ambien in the past.  No new symptoms.  No panic attacks.  Of note, she also changed departments at work and feels good about this change. ? ?Assessment  ?1. History of hypertension   ?2. GAD (generalized anxiety disorder)   ?3. Adjustment insomnia   ? ?  ?Plan  ?History of hypertension: Blood pressure remains very normal.  Status post gastric bypass and blood pressures normalized with weight loss.  Reassured.  Continue healthy diet. ?Generalized anxiety disorder and adjustment insomnia: Continue to counsel.  Recommend meditation and relaxation techniques.  Reassurance.  May use Xanax if needed.  Suspect Zoloft over the next 4 to 6 weeks will help both of these problems. ? ?Follow up: 6 months for complete physical and follow-up ?Visit date not found ? ?No orders of the defined types were placed in this encounter. ? ?No orders of the defined types were placed in this encounter. ? ?  ? ?I reviewed the patients updated PMH, FH, and SocHx.  ?  ?Patient  Active Problem List  ? Diagnosis Date Noted  ? GAD (generalized anxiety disorder) 05/14/2021  ? 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/2020 04/30/2021  ? Vitamin D deficiency 10/27/2019  ? Panic attacks 10/27/2019  ? History of classical cesarean section 07/27/2019  ? History of maternal deep vein thrombosis (DVT) 05/10/2019  ? 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 Family history of breast cancer in mother 04/05/2017  ? Essential hypertension 04/01/2015  ? Gastroesophageal reflux disease without esophagitis 04/01/2015  ? PCOS (polycystic ovarian syndrome) 04/01/2015  ? ?Current Meds  ?Medication Sig  ? ALPRAZolam (XANAX) 0.5 MG tablet Take 0.5-1 tablets (0.25-0.5 mg total) by mouth 2 (two) times daily as needed for anxiety (panic attack).  ? sertraline (ZOLOFT) 25 MG tablet Take 1 tablet (25 mg total) by mouth daily for 14 days, THEN 2 tablets (50 mg total) daily for 14 days.  ? ? ?Allergies: ?Patient is allergic to codeine, nifedipine, and raspberry. ?Family History: ?Patient family history includes Breast cancer in her maternal grandmother, mother, and paternal aunt; Hypertension in her father. ?Social History:  ?Patient  reports that she has never smoked. She has never used smokeless tobacco. She reports current alcohol use. She reports that she does not use drugs. ? ?Review of Systems: ?Constitutional: Negative for fever malaise or anorexia ?Cardiovascular: negative for chest pain ?Respiratory: negative for SOB or persistent cough ?Gastrointestinal: negative for abdominal pain ? ?Objective  ?Vitals: BP 116/72   Pulse 67  Temp 97.7 ?F (36.5 ?C) (Temporal)   Ht 5\' 5"  (1.651 m)   Wt 212 lb 12.8 oz (96.5 kg)   SpO2 100%   BMI 35.41 kg/m?  ?General: no acute distress , A&Ox3 ?HEENT: PEERL, conjunctiva normal, neck is supple ?Cardiovascular:  RRR without murmur or gallop.  ?Respiratory:  Good breath sounds bilaterally, CTAB with normal respiratory effort ?Skin:  Warm, no rashes ? ? ? ?Commons side effects, risks,  benefits, and alternatives for medications and treatment plan prescribed today were discussed, and the patient expressed understanding of the given instructions. Patient is instructed to call or message via MyChart if he/she has any questions or concerns regarding our treatment plan. No barriers to understanding were identified. We discussed Red Flag symptoms and signs in detail. Patient expressed understanding regarding what to do in case of urgent or emergency type symptoms.  ?Medication list was reconciled, printed and provided to the patient in AVS. Patient instructions and summary information was reviewed with the patient as documented in the AVS. ?This note was prepared with assistance of . Occasional wrong-word or sound-a-like substitutions may have occurred due to the inherent limitations of voice recognition software ? ?This visit occurred during the SARS-CoV-2 public health emergency.  Safety protocols were in place, including screening questions prior to the visit, additional usage of staff PPE, and extensive cleaning of exam room while observing appropriate contact time as indicated for disinfecting solutions.  ? ?

## 2021-05-14 NOTE — Patient Instructions (Signed)
Please return in 6 months for your annual complete physical; please come fasting.   If you have any questions or concerns, please don't hesitate to send me a message via MyChart or call the office at [phone]. Thank you for visiting with us today! It's our pleasure caring for you.  

## 2021-05-14 NOTE — Progress Notes (Signed)
Bariatric Nutrition Follow-Up Visit ?Medical Nutrition Therapy  ? ?NUTRITION ASSESSMENT ?  ? ?Anthropometrics  ?Surgery date: 01/13/2021 ?Surgery type: RYGB ?Start weight at [facility]: 266.4 ?Weight today: 213.1 pounds ? ?Body Composition Scale 03/12/2021 05/14/2021  ?Weight  lbs 226.1 213.1  ?Total Body Fat  % 41.1 40.3  ?   Visceral Fat 11 11  ?Fat-Free Mass  % 58.8 59.6  ?   Total Body Water  % 43.9 44.3  ?   Muscle-Mass  lbs 33.4 32.3  ?BMI 37.3 36.5  ?Body Fat Displacement ---   ?      Torso  lbs 57.6 53.2  ?      Left Leg  lbs 11.5 10.6  ?      Right Leg  lbs 11.5 10.6  ?      Left Arm  lbs 5.7 5.3  ?      Right Arm  lbs 5.7 5.3  ? ?Clinical  ?Medical hx: GERD, PCOS, HTN ?Medications: back on Zoloft ?Labs:  ?Notable signs/symptoms: N/A ?Any previous deficiencies? No ?  ?Lifestyle & Dietary Hx ? ?Pt states she needs to find a one a day bariatric multivitamin ?Pt states she has had some constipation ?Pt states she has eaten salad mix, and some vegetables ?Pt states she walks a lot and some [place] ?Pt states getting weights platform, rack, for olympic weight lifting, and is planning to lift a lot. ?Pt states she has generalized anxiety, and back on Zoloft ?Pt is wondering how long the hair loss will last ?Pt states her wife continues to be very supportive and currently pregnant. ? ? ?Estimated daily fluid intake: 64 oz ?Estimated daily protein intake: 60 g ?Supplements: multi and calcium ?Current average weekly physical activity: 3-4 days a week 30 minutes   ? ?24-Hr Dietary Recall ?First Meal:   ?Snack:   ?Second Meal:   ?Snack:   ?Third Meal:  ?Snack:  ?Beverages:  ? ?Post-Op Goals/ Signs/ Symptoms ?Using straws: no ?Drinking while eating: no ?Chewing/swallowing difficulties: no ?Changes in vision: no ?Changes to mood/headaches: no ?Hair loss/changes to skin/nails: no ?Difficulty focusing/concentrating: no ?Sweating: no ?Limb weakness: no ?Dizziness/lightheadedness: no ?Palpitations: no  ?Carbonated/caffeinated  beverages: no ?N/V/D/C/Gas: no ?Abdominal pain: no ?Dumping syndrome: no ? ?  ?NUTRITION DIAGNOSIS  ?Overweight/obesity ([place]-3.3) related to past poor dietary habits and physical inactivity as evidenced by completed bariatric surgery and following dietary guidelines for continued weight loss and healthy nutrition status. ?  ?  ?NUTRITION INTERVENTION ?Nutrition counseling (C-1) and education (E-2) to facilitate bariatric surgery goals, including: ?Diet advancement out of phases and now including starchy vegetables  ?The importance of consuming adequate calories as well as certain nutrients daily due to the body's need for essential vitamins, minerals, and fats ?The importance of daily physical activity and to reach a goal of at least 150 minutes of moderate to vigorous physical activity weekly (or as directed by their physician) due to benefits such as increased musculature and improved lab values ?The importance of intuitive eating specifically learning hunger-satiety cues and understanding the importance of learning a new body: The importance of mindful eating to avoid grazing behaviors  ? ?Goals: ?-Continue to aim for a minimum of 64 fluid ounces 7 days a week with at least 30 ounces being plain water ? ?-Add starchy vegetables 2 times a day 7 days a week ? ?-Eat your 3 ounces of protein first then start in on your non-starchy vegetables, then starchy vegetables; once you understand how much of  your meal leads to satisfaction and not full while still eating 3 ounces of protein, non-starchy vegetables and starchy vegetables you can eat them in any order  ? ?-Continue to aim for 30 minutes of activity at least 5 times a week ? ?-Do NOT cook with/add to your food: alfredo sauce, cheese sauce, barbeque sauce, ketchup, fat back, butter, bacon grease, grease, Crisco, OR SUGAR ? ? ?Handouts Provided Include  ?Phase 5 ? ?Learning Style & Readiness for Change ?Teaching method utilized: Visual & Auditory  ?Demonstrated  degree of understanding via: Teach Back  ?Readiness Level: action ?Barriers to learning/adherence to lifestyle change: none identified ? ?RD's Notes for Next Visit ?Assess adherence to pt chosen goals ? ? ?MONITORING & EVALUATION ?Dietary intake, weekly physical activity, body weight ? ?Next Steps ?Patient is to follow-up in 3 months ?

## 2021-06-02 ENCOUNTER — Encounter: Payer: Self-pay | Admitting: Physician Assistant

## 2021-06-02 ENCOUNTER — Ambulatory Visit: Payer: BC Managed Care – PPO | Admitting: Physician Assistant

## 2021-06-02 VITALS — BP 135/88 | HR 71 | Temp 98.1°F | Ht 65.0 in | Wt 213.4 lb

## 2021-06-02 DIAGNOSIS — M7918 Myalgia, other site: Secondary | ICD-10-CM

## 2021-06-02 MED ORDER — DICLOFENAC SODIUM 1 % EX GEL: 2.0000 g | Freq: Four times a day (QID) | CUTANEOUS | 0 refills | Status: DC

## 2021-06-02 NOTE — Patient Instructions (Signed)
Good to see you today! ?I don't see any alarming features on exam today. This could still be some inflammation from your recent muscular knot. I would try Voltaren gel OTC, ice roller, gentle massage and stretches. Consider dry needling or trigger point injection if worse or no better.  ?

## 2021-06-02 NOTE — Progress Notes (Signed)
? ?Subjective:  ? ? Patient ID: Sabrina White, female    DOB: [birth year], 36 y.o.   MRN: [id number] ? ?Chief Complaint  ?Patient presents with  ? Cyst  ?  Located on her left shoulder blade. She is experiencing internal itching, she complains of tenderness. She has used hot/cold compresses. But has not subsided.  ? ? ?HPI ?Patient is in today for ?knot left shoulder blade x 2 weeks. Cannot see it from the outside. Active release therapy with [employer] - hot /cold compresses. No longer feels like a muscular knot. Now feeling "deep itching" in this knot area. Sometimes tingling feeling. Has full ROM of shoulder. No rash. No hx of shingles.  ? ?Past Medical History:  ?Diagnosis Date  ? Anxiety   ? Family history of adverse reaction to anesthesia   ? Mother has PONV  ? Family history of breast cancer in mother 04/05/2017  ? Mother and maternal grandmother, not genetically tested. Paternal aunt, negative BRCA gene tested.  ? 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/2020 04/30/2021  ? GERD (gastroesophageal reflux disease)   ? History of hiatal hernia   ? Hypertension   ? Hypothyroidism   ? resolved after pregnancy  ? PCOS (polycystic ovarian syndrome)   ? ? ?Past Surgical History:  ?Procedure Laterality Date  ? CESAREAN SECTION N/A 05/06/2019  ? Procedure: CESAREAN SECTION;  Surgeon: Aloha Gell, MD;  Location: [facility];  Service: Obstetrics;  Laterality: N/A;  ? ESOPHAGOGASTRODUODENOSCOPY ENDOSCOPY  11/19/2020  ? GASTRIC ROUX-EN-Y N/A 01/13/2021  ? Procedure: LAPAROSCOPIC ROUX-EN-Y GASTRIC BYPASS WITH UPPER ENDOSCOPY;  Surgeon: Clovis Riley, MD;  Location: [facility];  Service: General;  Laterality: N/A;  ? HIATAL HERNIA REPAIR N/A 01/13/2021  ? Procedure: HERNIA REPAIR HIATAL;  Surgeon: Clovis Riley, MD;  Location: [facility];  Service: General;  Laterality: N/A;  ? ? ?Family History  ?Problem Relation Age of Onset  ? Breast cancer Mother   ? Hypertension Father   ? Breast cancer Paternal Aunt   ? Breast  cancer Maternal Grandmother   ? Colon cancer Neg Hx   ? Pancreatic cancer Neg Hx   ? Esophageal cancer Neg Hx   ? Liver cancer Neg Hx   ? Stomach cancer Neg Hx   ? Colon polyps Neg Hx   ? ? ?Social History  ? ?Tobacco Use  ? Smoking status: Never  ? Smokeless tobacco: Never  ?Vaping Use  ? Vaping Use: Never used  ?Substance Use Topics  ? Alcohol use: Yes  ?  Comment: occasional  ? Drug use: No  ?  ? ?Allergies  ?Allergen Reactions  ? Codeine Other (See Comments)  ?  Other reaction(s): Dizziness  ? Nifedipine Nausea And Vomiting  ?  Caused severe headache.  ? Raspberry Other (See Comments)  ?  Causes blisters on lips  ? ? ?Review of Systems ?NEGATIVE UNLESS OTHERWISE INDICATED IN HPI ? ? ?   ?Objective:  ?  ? ?BP 135/88   Pulse 71   Temp 98.1 ?F (36.7 ?C) (Temporal)   Ht $R'5\' 5"'nh$  (1.651 m)   Wt 213 lb 6.4 oz (96.8 kg)   SpO2 99%   BMI 35.51 kg/m?  ? ?Wt Readings from Last 3 Encounters:  ?06/02/21 213 lb 6.4 oz (96.8 kg)  ?05/14/21 213 lb 1.6 oz (96.7 kg)  ?05/14/21 212 lb 12.8 oz (96.5 kg)  ? ? ?BP Readings from Last 3 Encounters:  ?06/02/21 135/88  ?05/14/21 116/72  ?04/30/21  114/82  ?  ? ?Physical Exam ?Vitals and nursing note reviewed.  ?Constitutional:   ?   Appearance: Normal appearance.  ?Musculoskeletal:  ?     Arms: ? ?Neurological:  ?   Mental Status: She is alert.  ? ? ?   ?Assessment & Plan:  ? ?Problem List Items Addressed This Visit   ?None ?Visit Diagnoses   ? ? Rhomboid muscle pain    -  Primary  ? ?  ? ? ? ?Meds ordered this encounter  ?Medications  ? diclofenac Sodium (VOLTAREN) 1 % GEL  ?  Sig: Apply 2 g topically 4 (four) times daily.  ?  Dispense:  50 g  ?  Refill:  0  ? ?Plan: ?I don't see any alarming features on exam today. This could still be some inflammation from your recent muscular knot. I would try Voltaren gel OTC, ice roller, gentle massage and stretches. Consider dry needling or trigger point injection if worse or no better.  ? ? ?Ledell Codrington M Deuntae Kocsis, PA-C ?

## 2021-06-04 ENCOUNTER — Other Ambulatory Visit: Payer: Self-pay

## 2021-07-31 ENCOUNTER — Ambulatory Visit
Admission: RE | Admit: 2021-07-31 | Discharge: 2021-07-31 | Disposition: A | Discharge status: Home / Self Care | Payer: BC Managed Care – PPO | Source: Ambulatory Visit | Attending: Obstetrics | Admitting: Obstetrics

## 2021-07-31 DIAGNOSIS — Z803 Family history of malignant neoplasm of breast: Secondary | ICD-10-CM

## 2021-07-31 MED ORDER — GADOBUTROL 1 MMOL/ML IV SOLN
9.0000 mL | Freq: Once | INTRAVENOUS | Status: AC | PRN
  Administered 2021-07-31: 9 mL via INTRAVENOUS

## 2021-08-02 ENCOUNTER — Other Ambulatory Visit: Payer: Self-pay | Admitting: Family Medicine

## 2021-08-14 ENCOUNTER — Ambulatory Visit: Payer: BC Managed Care – PPO | Admitting: Skilled Nursing Facility1

## 2021-10-07 ENCOUNTER — Telehealth: Payer: BC Managed Care – PPO | Admitting: Nurse Practitioner

## 2021-10-07 DIAGNOSIS — R0982 Postnasal drip: Secondary | ICD-10-CM

## 2021-10-07 DIAGNOSIS — R051 Acute cough: Secondary | ICD-10-CM

## 2021-10-07 MED ORDER — BENZONATATE 100 MG PO CAPS: 100.0000 mg | ORAL_CAPSULE | Freq: Three times a day (TID) | ORAL | 0 refills | Status: DC | PRN

## 2021-10-07 MED ORDER — FLUTICASONE PROPIONATE 50 MCG/ACT NA SUSP: 2.0000 | Freq: Every day | NASAL | 6 refills | Status: DC

## 2021-10-07 NOTE — Progress Notes (Signed)
Virtual Visit Consent   Sabrina White, you are scheduled for a virtual visit with a [facility] provider today. Just as with appointments in the office, your consent must be obtained to participate. Your consent will be active for this visit and any virtual visit you may have with one of our providers in the next 365 days. If you have a MyChart account, a copy of this consent can be sent to you electronically.  As this is a virtual visit, video technology does not allow for your provider to perform a traditional examination. This may limit your provider's ability to fully assess your condition. If your provider identifies any concerns that need to be evaluated in person or the need to arrange testing (such as labs, EKG, etc.), we will make arrangements to do so. Although advances in technology are sophisticated, we cannot ensure that it will always work on either your end or our end. If the connection with a video visit is poor, the visit may have to be switched to a telephone visit. With either a video or telephone visit, we are not always able to ensure that we have a secure connection.  By engaging in this virtual visit, you consent to the provision of healthcare and authorize for your insurance to be billed (if applicable) for the services provided during this visit. Depending on your insurance coverage, you may receive a charge related to this service.  I need to obtain your verbal consent now. Are you willing to proceed with your visit today? Sabrina White has provided verbal consent on 10/07/2021 for a virtual visit (video or telephone). Viviano Simas, FNP  Date: 10/07/2021 11:10 AM  Virtual Visit via Video Note   I, Viviano Simas, connected with  Sabrina White  ([id number], March 13, 1985) on 10/07/21 at 11:15 AM EDT by a video-enabled telemedicine application and verified that I am speaking with the correct person using two identifiers.  Location: Patient: Virtual Visit Location  Patient: Home Provider: Virtual Visit Location Provider: Home Office   I discussed the limitations of evaluation and management by telemedicine and the availability of in person appointments. The patient expressed understanding and agreed to proceed.    History of Present Illness: Sabrina White is a 36 y.o. who identifies as a female who was assigned female at birth, and is being seen today with complaints of scratchy throat, two days ago she started to develop a dry cough that is keeping her up at night as well.   She has taken two negative home COVID tests.   She has been using warm drinks for relief. Feels her voice is being bothered now.   Denies congestion or PND   Does not feel like strep  Denies fever   Denies a history of allergies   She has had bronchitis in the past and has used inhalers in the past but denies any wheezing with this episode   She is s/p Gastric bypass- does not take ibuprofen   Denies any reflux feelings History of GERD that has been relieves since bypass.   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   Raspberry Other (See Comments)    Causes blisters on lips   Medications:  Current Outpatient Medications:    albuterol (VENTOLIN HFA) 108 (90  Base) MCG/ACT inhaler, Inhale into the lungs every 6 (six) hours as needed for wheezing or shortness of breath. (Patient not taking: Reported on 04/30/2021), Disp: , Rfl:    ALPRAZolam (XANAX) 0.5 MG tablet, TAKE 1/2 TO 1 TABLET(0.25 TO 0.5 MG) BY MOUTH TWICE DAILY AS NEEDED FOR ANXIETY OR PANIC ATTACKS, Disp: 30 tablet, Rfl: 1   diclofenac Sodium (VOLTAREN) 1 % GEL, Apply 2 g topically 4 (four) times daily., Disp: 50 g, Rfl: 0   nystatin-triamcinolone ointment (MYCOLOG), Apply topically 2 (two) times daily. (Patient not taking: Reported on 05/14/2021), Disp: , Rfl:    sertraline (ZOLOFT) 25 MG tablet, Take 1 tablet (25 mg total) by mouth daily for 14 days, THEN 2 tablets (50 mg total) daily for 14 days., Disp: 42 tablet, Rfl: 1  Observations/Objective: Patient is well-developed, well-nourished in no acute distress.  Resting comfortably  at home.  Head is normocephalic, atraumatic.  No labored breathing.  Speech is clear and coherent with logical content.  Patient is alert and oriented at baseline.    Assessment and Plan: 1. Post-nasal drainage Recommended voice rest, pushing fluids, humidifier, tea/honey or cough drops with natural honey for relief of throat irritation and voice preservation.   - benzonatate (TESSALON) 100 MG capsule; Take 1 capsule (100 mg total) by mouth 3 (three) times daily as needed for cough (1-2 capsules three times daily. 6 max in 24 hours).  Dispense: 30 capsule; Refill: 0 - fluticasone (FLONASE) 50 MCG/ACT nasal spray; Place 2 sprays into both nostrils daily.  Dispense: 16 g; Refill: 6  2. Acute cough  - benzonatate (TESSALON) 100 MG capsule; Take 1 capsule (100 mg total) by mouth 3 (three) times daily as needed for cough (1-2 capsules three times daily. 6 max in 24 hours).  Dispense: 30 capsule; Refill: 0     Follow Up Instructions: I discussed the assessment and treatment plan with the patient. The patient was provided an opportunity to ask questions and all  were answered. The patient agreed with the plan and demonstrated an understanding of the instructions.  A copy of instructions were sent to the patient via MyChart unless otherwise noted below.    The patient was advised to call back or seek an in-person evaluation if the symptoms worsen or if the condition fails to improve as anticipated.  Time:  I spent 10 minutes with the patient via telehealth technology discussing the above problems/concerns.    Viviano Simas, FNP

## 2021-10-29 ENCOUNTER — Other Ambulatory Visit: Payer: Self-pay | Admitting: Family Medicine

## 2021-11-17 ENCOUNTER — Encounter: Payer: Self-pay | Admitting: *Deleted

## 2022-02-05 ENCOUNTER — Encounter: Payer: Self-pay | Admitting: *Deleted

## 2022-03-19 LAB — HM PAP SMEAR

## 2022-03-24 ENCOUNTER — Other Ambulatory Visit: Payer: Self-pay | Admitting: Family Medicine

## 2022-03-25 ENCOUNTER — Other Ambulatory Visit: Payer: Self-pay | Admitting: Family Medicine

## 2022-03-26 NOTE — Telephone Encounter (Addendum)
LAST APPOINTMENT DATE: 03/24/2022  Visit date not found 05/14/2021

## 2022-03-29 ENCOUNTER — Telehealth: Payer: BC Managed Care – PPO | Admitting: Family

## 2022-03-29 DIAGNOSIS — U071 COVID-19: Secondary | ICD-10-CM | POA: Diagnosis not present

## 2022-03-29 MED ORDER — MOLNUPIRAVIR EUA 200MG CAPSULE: 4.0000 | ORAL_CAPSULE | Freq: Two times a day (BID) | ORAL | 0 refills | Status: AC

## 2022-03-29 MED ORDER — BENZONATATE 100 MG PO CAPS: 100.0000 mg | ORAL_CAPSULE | Freq: Three times a day (TID) | ORAL | 0 refills | Status: DC | PRN

## 2022-03-29 NOTE — Progress Notes (Signed)
Virtual Visit Consent   Sabrina White, you are scheduled for a virtual visit with a [facility] provider today. Just as with appointments in the office, your consent must be obtained to participate. Your consent will be active for this visit and any virtual visit you may have with one of our providers in the next 365 days. If you have a MyChart account, a copy of this consent can be sent to you electronically.  As this is a virtual visit, video technology does not allow for your provider to perform a traditional examination. This may limit your provider's ability to fully assess your condition. If your provider identifies any concerns that need to be evaluated in person or the need to arrange testing (such as labs, EKG, etc.), we will make arrangements to do so. Although advances in technology are sophisticated, we cannot ensure that it will always work on either your end or our end. If the connection with a video visit is poor, the visit may have to be switched to a telephone visit. With either a video or telephone visit, we are not always able to ensure that we have a secure connection.  By engaging in this virtual visit, you consent to the provision of healthcare and authorize for your insurance to be billed (if applicable) for the services provided during this visit. Depending on your insurance coverage, you may receive a charge related to this service.  I need to obtain your verbal consent now. Are you willing to proceed with your visit today? [name] has provided verbal consent on 03/29/2022 for a virtual visit (video or telephone). [name], FNP  Date: 03/29/2022 10:00 AM  Virtual Visit via Video Note   I, [name], connected with  Sabrina White  ([id number], 10-29-1985) on 03/29/22 at 10:00 AM EST by a video-enabled telemedicine application and verified that I am speaking with the correct person using two identifiers.  Location: Patient: Virtual Visit Location  Patient: Home Provider: Virtual Visit Location Provider: Home Office   I discussed the limitations of evaluation and management by telemedicine and the availability of in person appointments. The patient expressed understanding and agreed to proceed.    History of Present Illness: Sabrina White is a 37 y.o. who identifies as a female who was assigned female at birth, and is being seen today for [place]. Reports her symptoms started this morning and tested positive. Her 41 year old son had COVID last week.   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   Raspberry Other (See Comments)    Causes blisters on lips   Medications:  Current Outpatient Medications:    benzonatate (TESSALON PERLES) 100 MG capsule, Take 1 capsule (100 mg total) by mouth 3 (three) times daily as needed., Disp: 20  capsule, Rfl: 0   molnupiravir EUA (LAGEVRIO) 200 mg CAPS capsule, Take 4 capsules (800 mg total) by mouth 2 (two) times daily for 5 days., Disp: 40 capsule, Rfl: 0   albuterol (VENTOLIN HFA) 108 (90 Base) MCG/ACT inhaler, Inhale into the lungs every 6 (six) hours as needed for wheezing or shortness of breath. (Patient not taking: Reported on 04/30/2021), Disp: , Rfl:    ALPRAZolam (XANAX) 0.5 MG  tablet, TAKE 1/2 TO 1 TABLET(0.25 TO 0.5 MG) BY MOUTH TWICE DAILY AS NEEDED FOR ANXIETY OR PANIC ATTACKS, Disp: 30 tablet, Rfl: 0   sertraline (ZOLOFT) 25 MG tablet, Take 1 tablet (25 mg total) by mouth daily for 14 days, THEN 2 tablets (50 mg total) daily for 14 days., Disp: 42 tablet, Rfl: 1   sertraline (ZOLOFT) 50 MG tablet, TAKE 1 TABLET(50 MG) BY MOUTH DAILY, Disp: 90 tablet, Rfl: 3  Observations/Objective: Patient is well-developed, well-nourished in no acute distress.  Resting comfortably  at home.  Head is normocephalic, atraumatic.  No labored breathing.  Speech is clear and coherent with logical content.  Patient is alert and oriented at baseline.    Assessment and Plan: 1. COVID-19 - molnupiravir EUA (LAGEVRIO) 200 mg CAPS capsule; Take 4 capsules (800 mg total) by mouth 2 (two) times daily for 5 days.  Dispense: 40 capsule; Refill: 0 - benzonatate (TESSALON PERLES) 100 MG capsule; Take 1 capsule (100 mg total) by mouth 3 (three) times daily as needed.  Dispense: 20 capsule; Refill: 0  COVID positive, rest, force fluids, tylenol as needed, Quarantine for at least 5 days and you are fever free, then must wear a mask out in public from day 3-78, report any worsening symptoms such as increased shortness of breath, swelling, or continued high fevers. Possible adverse effects discussed with antivirals.    Follow Up Instructions: I discussed the assessment and treatment plan with the patient. The patient was provided an opportunity to ask questions and all were answered. The patient agreed with the plan and demonstrated an understanding of the instructions.  A copy of instructions were sent to the patient via MyChart unless otherwise noted below.     The patient was advised to call back or seek an in-person evaluation if the symptoms worsen or if the condition fails to improve as anticipated.  Time:  I spent 6 minutes with the patient via telehealth technology discussing the above  problems/concerns.    [name], FNP

## 2022-06-22 ENCOUNTER — Telehealth: Payer: BC Managed Care – PPO | Admitting: Physician Assistant

## 2022-06-22 DIAGNOSIS — B379 Candidiasis, unspecified: Secondary | ICD-10-CM | POA: Diagnosis not present

## 2022-06-22 DIAGNOSIS — T3695XA Adverse effect of unspecified systemic antibiotic, initial encounter: Secondary | ICD-10-CM | POA: Diagnosis not present

## 2022-06-22 DIAGNOSIS — R197 Diarrhea, unspecified: Secondary | ICD-10-CM | POA: Diagnosis not present

## 2022-06-22 MED ORDER — CIPROFLOXACIN HCL 500 MG PO TABS
500.0000 mg | ORAL_TABLET | Freq: Two times a day (BID) | ORAL | 0 refills | Status: AC
Start: 2022-06-22 — End: 2022-06-25

## 2022-06-22 MED ORDER — DICYCLOMINE HCL 10 MG PO CAPS
10.0000 mg | ORAL_CAPSULE | Freq: Three times a day (TID) | ORAL | 0 refills | Status: DC
Start: 2022-06-22 — End: 2022-08-19

## 2022-06-22 MED ORDER — FLUCONAZOLE 150 MG PO TABS
150.0000 mg | ORAL_TABLET | ORAL | 0 refills | Status: DC | PRN
Start: 2022-06-22 — End: 2022-08-19

## 2022-06-22 NOTE — Patient Instructions (Signed)
Aleyna Derrill White, thank you for joining Sabrina Loveless, PA-C for today's virtual visit.  While this provider is not your primary care provider (PCP), if your PCP is located in our provider database this encounter information will be shared with them immediately following your visit.   A [facility] MyChart account gives you access to today's visit and all your visits, tests, and labs performed at [facility] " click here if you don't have a [facility] MyChart account or go to [email or web address]  Consent: (Patient) Sabrina White provided verbal consent for this virtual visit at the beginning of the encounter.  Current Medications:  Current Outpatient Medications:    ciprofloxacin (CIPRO) 500 MG tablet, Take 1 tablet (500 mg total) by mouth 2 (two) times daily for 3 days., Disp: 6 tablet, Rfl: 0   dicyclomine (BENTYL) 10 MG capsule, Take 1 capsule (10 mg total) by mouth 4 (four) times daily -  before meals and at bedtime., Disp: 21 capsule, Rfl: 0   fluconazole (DIFLUCAN) 150 MG tablet, Take 1 tablet (150 mg total) by mouth every 3 (three) days as needed., Disp: 2 tablet, Rfl: 0   albuterol (VENTOLIN HFA) 108 (90 Base) MCG/ACT inhaler, Inhale into the lungs every 6 (six) hours as needed for wheezing or shortness of breath. (Patient not taking: Reported on 04/30/2021), Disp: , Rfl:    ALPRAZolam (XANAX) 0.5 MG tablet, TAKE 1/2 TO 1 TABLET(0.25 TO 0.5 MG) BY MOUTH TWICE DAILY AS NEEDED FOR ANXIETY OR PANIC ATTACKS, Disp: 30 tablet, Rfl: 0   benzonatate (TESSALON PERLES) 100 MG capsule, Take 1 capsule (100 mg total) by mouth 3 (three) times daily as needed., Disp: 20 capsule, Rfl: 0   sertraline (ZOLOFT) 25 MG tablet, Take 1 tablet (25 mg total) by mouth daily for 14 days, THEN 2 tablets (50 mg total) daily for 14 days., Disp: 42 tablet, Rfl: 1   sertraline (ZOLOFT) 50 MG tablet, TAKE 1 TABLET(50 MG) BY MOUTH DAILY, Disp: 90 tablet, Rfl: 3   Medications ordered in this  encounter:  Meds ordered this encounter  Medications   dicyclomine (BENTYL) 10 MG capsule    Sig: Take 1 capsule (10 mg total) by mouth 4 (four) times daily -  before meals and at bedtime.    Dispense:  21 capsule    Refill:  0    Order Specific Question:   Supervising Provider    Answer:   Merrilee Jansky [id number]   ciprofloxacin (CIPRO) 500 MG tablet    Sig: Take 1 tablet (500 mg total) by mouth 2 (two) times daily for 3 days.    Dispense:  6 tablet    Refill:  0    Order Specific Question:   Supervising Provider    Answer:   Merrilee Jansky [[id number]]   fluconazole (DIFLUCAN) 150 MG tablet    Sig: Take 1 tablet (150 mg total) by mouth every 3 (three) days as needed.    Dispense:  2 tablet    Refill:  0    Order Specific Question:   Supervising Provider    Answer:   Merrilee Jansky [id number]     *If you need refills on other medications prior to your next appointment, please contact your pharmacy*  Follow-Up: Call back or seek an in-person evaluation if the symptoms worsen or if the condition fails to improve as anticipated.  [facility] [phone]  Other Instructions Diarrhea, Adult Diarrhea is frequent loose and sometimes  watery bowel movements. Diarrhea can make you feel weak and cause you to become dehydrated. Dehydration is a condition in which there is not enough water or other fluids in the body. Dehydration can make you tired and thirsty, cause you to have a dry mouth, and decrease how often you urinate. Diarrhea typically lasts 2-3 days. However, it can last longer if it is a sign of something more serious. It is important to treat your diarrhea as told by your health care provider. Follow these instructions at home: Eating and drinking     Follow these recommendations as told by your health care provider: Take an oral rehydration solution (ORS). This is an over-the-counter medicine that helps return your body to its normal balance of  nutrients and water. It is found at pharmacies and retail stores. Drink enough fluid to keep your urine pale yellow. Drink fluids such as water, diluted fruit juice, and low-calorie sports drinks. You can drink milk also, if desired. Sucking on ice chips is another way to get fluids. Avoid drinking fluids that contain a lot of sugar or caffeine, such as soda, energy drinks, and regular sports drinks. Avoid alcohol. Eat bland, easy-to-digest foods in small amounts as you are able. These foods include bananas, applesauce, rice, lean meats, toast, and crackers. Avoid spicy or fatty foods.  Medicines Take over-the-counter and prescription medicines only as told by your health care provider. If you were prescribed antibiotics, take them as told by your health care provider. Do not stop using the antibiotic even if you start to feel better. General instructions  Wash your hands often using soap and water for at least 20 seconds. If soap and water are not available, use hand sanitizer. Others in the household should wash their hands as well. Hands should be washed: After using the toilet or changing a diaper. Before preparing, cooking, or serving food. While caring for a sick person or while visiting someone in a hospital. Rest at home while you recover. Take a warm bath to relieve any burning or pain from frequent diarrhea episodes. Watch your condition for any changes. Contact a health care provider if: You have a fever. Your diarrhea gets worse. You have new symptoms. You vomit every time you eat or drink. You feel light-headed, dizzy, or have a headache. You have muscle cramps. You have signs of dehydration, such as: Dark urine, very little urine, or no urine. Cracked lips. Dry mouth. Sunken eyes. Sleepiness. Weakness. You have bloody or black stools or stools that look like tar. You have severe pain, cramping, or bloating in your abdomen. Your skin feels cold and clammy. You feel  confused. Get help right away if: You have chest pain or your heart is beating very quickly. You have trouble breathing or you are breathing very quickly. You feel extremely weak or you faint. These symptoms may be an emergency. Get help right away. Call 911. Do not wait to see if the symptoms will go away. Do not drive yourself to the hospital. This information is not intended to replace advice given to you by your health care provider. Make sure you discuss any questions you have with your health care provider. Document Revised: 07/29/2021 Document Reviewed: 07/29/2021 Elsevier Patient Education  2023 Elsevier Inc.    If you have been instructed to have an in-person evaluation today at a local Urgent Care facility, please use the link below. It will take you to a list of all of our available [facility], including address, phone number and hours of operation. Please do not delay care.  [facility]  If you or a family member do not have a primary care provider, use the link below to schedule a visit and establish care. When you choose a [facility] primary care physician or advanced practice provider, you gain a long-term partner in health. Find a Primary Care Provider  Learn more about [facility]'s in-office and virtual care options: [facility]

## 2022-06-22 NOTE — Progress Notes (Signed)
Virtual Visit Consent   Sabrina White, you are scheduled for a virtual visit with a [facility] provider today. Just as with appointments in the office, your consent must be obtained to participate. Your consent will be active for this visit and any virtual visit you may have with one of our providers in the next 365 days. If you have a MyChart account, a copy of this consent can be sent to you electronically.  As this is a virtual visit, video technology does not allow for your provider to perform a traditional examination. This may limit your provider's ability to fully assess your condition. If your provider identifies any concerns that need to be evaluated in person or the need to arrange testing (such as labs, EKG, etc.), we will make arrangements to do so. Although advances in technology are sophisticated, we cannot ensure that it will always work on either your end or our end. If the connection with a video visit is poor, the visit may have to be switched to a telephone visit. With either a video or telephone visit, we are not always able to ensure that we have a secure connection.  By engaging in this virtual visit, you consent to the provision of healthcare and authorize for your insurance to be billed (if applicable) for the services provided during this visit. Depending on your insurance coverage, you may receive a charge related to this service.  I need to obtain your verbal consent now. Are you willing to proceed with your visit today? Sabrina White has provided verbal consent on 06/22/2022 for a virtual visit (video or telephone). Sabrina Loveless, PA-C  Date: 06/22/2022 8:11 AM  Virtual Visit via Video Note   I, Sabrina White, connected with  Sabrina White  ([id number], 1986-02-15) on 06/22/22 at  8:00 AM EDT by a video-enabled telemedicine application and verified that I am speaking with the correct person using two identifiers.  Location: Patient: Virtual Visit  Location Patient: Home Provider: Virtual Visit Location Provider: Home Office   I discussed the limitations of evaluation and management by telemedicine and the availability of in person appointments. The patient expressed understanding and agreed to proceed.    History of Present Illness: Sabrina White is a 37 y.o. who identifies as a female who was assigned female at birth, and is being seen today for diarrhea.  HPI: Diarrhea  This is a new problem. The current episode started in the past 7 days (Started last Thursday, 06/18/22, but worsened last night, 06/21/22). The problem occurs 2 to 4 times per day. The problem has been gradually worsening. Diarrhea characteristics: loose but not watery, but became watery last night. The patient states that diarrhea awakens her from sleep. Associated symptoms include abdominal pain, chills, a fever (T 101 on Thursday) and increased flatus. Pertinent negatives include no bloating, headaches, myalgias, sweats, URI or vomiting. Associated symptoms comments: Tenesmus after eating. Risk factors: s/p gastric bypass. Treatments tried: pepcid. The treatment provided no relief.     Problems:  Patient Active Problem List   Diagnosis Date Noted   GAD (generalized anxiety disorder) 05/14/2021   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/2020 04/30/2021   Vitamin D deficiency 10/27/2019   Panic attacks 10/27/2019   History of classical cesarean section 07/27/2019   History of maternal deep vein thrombosis (DVT) 05/10/2019   Eczema of external ear 07/14/2018   Family history of breast cancer in mother 04/05/2017   Essential hypertension 04/01/2015  Gastroesophageal reflux disease without esophagitis 04/01/2015   PCOS (polycystic ovarian syndrome) 04/01/2015    Allergies:  Allergies  Allergen Reactions   Codeine Other (See Comments)    Other reaction(s): Dizziness   Nifedipine Nausea And Vomiting    Caused severe headache.   Raspberry Other (See Comments)     Causes blisters on lips   Medications:  Current Outpatient Medications:    ciprofloxacin (CIPRO) 500 MG tablet, Take 1 tablet (500 mg total) by mouth 2 (two) times daily for 3 days., Disp: 6 tablet, Rfl: 0   dicyclomine (BENTYL) 10 MG capsule, Take 1 capsule (10 mg total) by mouth 4 (four) times daily -  before meals and at bedtime., Disp: 21 capsule, Rfl: 0   fluconazole (DIFLUCAN) 150 MG tablet, Take 1 tablet (150 mg total) by mouth every 3 (three) days as needed., Disp: 2 tablet, Rfl: 0   albuterol (VENTOLIN HFA) 108 (90 Base) MCG/ACT inhaler, Inhale into the lungs every 6 (six) hours as needed for wheezing or shortness of breath. (Patient not taking: Reported on 04/30/2021), Disp: , Rfl:    ALPRAZolam (XANAX) 0.5 MG tablet, TAKE 1/2 TO 1 TABLET(0.25 TO 0.5 MG) BY MOUTH TWICE DAILY AS NEEDED FOR ANXIETY OR PANIC ATTACKS, Disp: 30 tablet, Rfl: 0   benzonatate (TESSALON PERLES) 100 MG capsule, Take 1 capsule (100 mg total) by mouth 3 (three) times daily as needed., Disp: 20 capsule, Rfl: 0   sertraline (ZOLOFT) 25 MG tablet, Take 1 tablet (25 mg total) by mouth daily for 14 days, THEN 2 tablets (50 mg total) daily for 14 days., Disp: 42 tablet, Rfl: 1   sertraline (ZOLOFT) 50 MG tablet, TAKE 1 TABLET(50 MG) BY MOUTH DAILY, Disp: 90 tablet, Rfl: 3  Observations/Objective: Patient is well-developed, well-nourished in no acute distress.  Resting comfortably at home.  Head is normocephalic, atraumatic.  No labored breathing.  Speech is clear and coherent with logical content.  Patient is alert and oriented at baseline.    Assessment and Plan: 1. Diarrhea of presumed infectious origin - dicyclomine (BENTYL) 10 MG capsule; Take 1 capsule (10 mg total) by mouth 4 (four) times daily -  before meals and at bedtime.  Dispense: 21 capsule; Refill: 0 - ciprofloxacin (CIPRO) 500 MG tablet; Take 1 tablet (500 mg total) by mouth 2 (two) times daily for 3 days.  Dispense: 6 tablet; Refill: 0  2.  Antibiotic-induced yeast infection - fluconazole (DIFLUCAN) 150 MG tablet; Take 1 tablet (150 mg total) by mouth every 3 (three) days as needed.  Dispense: 2 tablet; Refill: 0  - Suspect infectious diarrhea - Bentyl for cramping - Cipro for potential bacterial infection - Push fluids, electrolyte beverages - Liquid diet, then increase to soft/bland (BRAT) diet over next day, then increase diet as tolerated - Seek in person evaluation if not improving or symptoms worsen   Follow Up Instructions: I discussed the assessment and treatment plan with the patient. The patient was provided an opportunity to ask questions and all were answered. The patient agreed with the plan and demonstrated an understanding of the instructions.  A copy of instructions were sent to the patient via MyChart unless otherwise noted below.    The patient was advised to call back or seek an in-person evaluation if the symptoms worsen or if the condition fails to improve as anticipated.  Time:  I spent 10 minutes with the patient via telehealth technology discussing the above problems/concerns.    Sabrina Loveless, PA-C

## 2022-07-20 ENCOUNTER — Other Ambulatory Visit: Payer: Self-pay | Admitting: Family Medicine

## 2022-07-21 MED ORDER — ALPRAZOLAM 0.5 MG PO TABS: 0.5000 mg | ORAL_TABLET | Freq: Every day | ORAL | 0 refills | Status: DC | PRN

## 2022-07-21 NOTE — Telephone Encounter (Signed)
Last ov: 05/14/2021 Last refill: 03/25/2022 Disp: 30     Refill : 0

## 2022-08-08 ENCOUNTER — Telehealth: Payer: BC Managed Care – PPO | Admitting: Physician Assistant

## 2022-08-08 DIAGNOSIS — L237 Allergic contact dermatitis due to plants, except food: Secondary | ICD-10-CM

## 2022-08-08 MED ORDER — HYDROXYZINE PAMOATE 25 MG PO CAPS
25.0000 mg | ORAL_CAPSULE | Freq: Three times a day (TID) | ORAL | 0 refills | Status: DC | PRN
Start: 2022-08-08 — End: 2022-11-17

## 2022-08-08 MED ORDER — PREDNISONE 10 MG PO TABS
10.0000 mg | ORAL_TABLET | Freq: Every day | ORAL | 0 refills | Status: DC
Start: 2022-08-08 — End: 2022-08-19

## 2022-08-08 NOTE — Progress Notes (Signed)
Virtual Visit Consent   Sabrina White, you are scheduled for a virtual visit with a [facility] provider today. Just as with appointments in the office, your consent must be obtained to participate. Your consent will be active for this visit and any virtual visit you may have with one of our providers in the next 365 days. If you have a MyChart account, a copy of this consent can be sent to you electronically.  As this is a virtual visit, video technology does not allow for your provider to perform a traditional examination. This may limit your provider's ability to fully assess your condition. If your provider identifies any concerns that need to be evaluated in person or the need to arrange testing (such as labs, EKG, etc.), we will make arrangements to do so. Although advances in technology are sophisticated, we cannot ensure that it will always work on either your end or our end. If the connection with a video visit is poor, the visit may have to be switched to a telephone visit. With either a video or telephone visit, we are not always able to ensure that we have a secure connection.  By engaging in this virtual visit, you consent to the provision of healthcare and authorize for your insurance to be billed (if applicable) for the services provided during this visit. Depending on your insurance coverage, you may receive a charge related to this service.  I need to obtain your verbal consent now. Are you willing to proceed with your visit today? [name] has provided verbal consent on 08/08/2022 for a virtual visit (video or telephone). [name], PA-C  Date: 08/08/2022 9:20 AM  Virtual Visit via Video Note   I, [name], connected with  Sabrina White  ([id number], 07-15-1985) on 08/08/22 at  9:00 AM EDT by a video-enabled telemedicine application and verified that I am speaking with the correct person using two identifiers.  Location: Patient: Virtual Visit Location  Patient: Home Provider: Virtual Visit Location Provider: Office/Clinic   I discussed the limitations of evaluation and management by telemedicine and the availability of in person appointments. The patient expressed understanding and agreed to proceed.    History of Present Illness: Sabrina White is a 37 y.o. who identifies as a female who was assigned female at birth, and is being seen today for poison ivy rash to her chest and legs that started several days ago after working outside.  She reports several episodes of poison ivy in the past and has taken prednisone.  She reports when she takes prednisone she is very sensitive to sun exposure and reports she is going to [place] soon and would like to avoid prednisone now if possible. She has tried topical creams with temporary relief. She reports difficulty sleeping due to itching.  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   Raspberry Other (See Comments)    Causes blisters on lips   Medications:  Current Outpatient Medications:    hydrOXYzine (VISTARIL) 25 MG capsule, Take 1 capsule (25 mg total) by mouth every 8 (eight) hours as needed., Disp: 30 capsule, Rfl: 0   predniSONE (DELTASONE) 10 MG tablet, Take 1 tablet (10 mg  total) by mouth daily with breakfast for 15 days. Day 1-5 take 4 tablets daily Day 6-10 take 2 tablets daily Day 11-15 take 1 tablet daily, Disp: 35 tablet, Rfl: 0   albuterol (VENTOLIN HFA) 108 (90 Base) MCG/ACT inhaler, Inhale into the lungs every 6 (six) hours as needed for wheezing or shortness of breath. (Patient not taking: Reported on 04/30/2021), Disp: , Rfl:    ALPRAZolam (XANAX) 0.5 MG tablet, Take 1 tablet (0.5 mg total) by mouth daily as needed for anxiety., Disp: 30 tablet, Rfl: 0   benzonatate (TESSALON PERLES) 100 MG capsule, Take 1 capsule (100 mg total) by mouth 3 (three) times daily as needed., Disp: 20 capsule, Rfl: 0   dicyclomine (BENTYL) 10 MG capsule, Take 1 capsule (10 mg total) by mouth 4 (four) times daily -  before meals and at bedtime., Disp: 21 capsule, Rfl: 0   fluconazole (DIFLUCAN) 150 MG tablet, Take 1 tablet (150 mg total) by mouth every 3 (three) days as needed., Disp: 2 tablet, Rfl: 0   sertraline (ZOLOFT) 25 MG tablet, Take 1 tablet (25 mg total) by mouth daily for 14 days, THEN 2 tablets (50 mg total) daily for 14 days., Disp: 42 tablet, Rfl: 1   sertraline (ZOLOFT) 50 MG tablet, TAKE 1 TABLET(50 MG) BY MOUTH DAILY, Disp: 90 tablet, Rfl: 3  Observations/Objective: Patient is well-developed, well-nourished in no acute distress.  Resting comfortably at home.  Head is normocephalic, atraumatic.  No labored breathing.  Speech is clear and coherent with logical content.  Patient is alert and oriented at baseline.    Assessment and Plan: 1. Poison ivy - hydrOXYzine (VISTARIL) 25 MG capsule; Take 1 capsule (25 mg total) by mouth every 8 (eight) hours as needed.  Dispense: 30 capsule; Refill: 0 - predniSONE (DELTASONE) 10 MG tablet; Take 1 tablet (10 mg total) by mouth daily with breakfast for 15 days. Day 1-5 take 4 tablets daily Day 6-10 take 2 tablets daily Day 11-15 take 1 tablet daily  Dispense: 35 tablet; Refill: 0    Follow Up Instructions: I discussed the  assessment and treatment plan with the patient. The patient was provided an opportunity to ask questions and all were answered. The patient agreed with the plan and demonstrated an understanding of the instructions.  A copy of instructions were sent to the patient via MyChart unless otherwise noted below.     The patient was advised to call back or seek an in-person evaluation if the symptoms worsen or if the condition fails to improve as anticipated.  Time:  I spent 15 minutes with the patient via telehealth technology discussing the above problems/concerns.    [name], PA-C

## 2022-08-08 NOTE — Patient Instructions (Signed)
Sabrina White, thank you for joining Sabrina Fantasia Ward, PA-C for today's virtual visit.  While this provider is not your primary care provider (PCP), if your PCP is located in our provider database this encounter information will be shared with them immediately following your visit.   A [facility] MyChart account gives you access to today's visit and all your visits, tests, and labs performed at [facility] " click here if you don't have a [place] MyChart account or go to [email or web address]  Consent: (Patient) Sabrina White provided verbal consent for this virtual visit at the beginning of the encounter.  Current Medications:  Current Outpatient Medications:    hydrOXYzine (VISTARIL) 25 MG capsule, Take 1 capsule (25 mg total) by mouth every 8 (eight) hours as needed., Disp: 30 capsule, Rfl: 0   predniSONE (DELTASONE) 10 MG tablet, Take 1 tablet (10 mg total) by mouth daily with breakfast for 15 days. Day 1-5 take 4 tablets daily Day 6-10 take 2 tablets daily Day 11-15 take 1 tablet daily, Disp: 35 tablet, Rfl: 0   albuterol (VENTOLIN HFA) 108 (90 Base) MCG/ACT inhaler, Inhale into the lungs every 6 (six) hours as needed for wheezing or shortness of breath. (Patient not taking: Reported on 04/30/2021), Disp: , Rfl:    ALPRAZolam (XANAX) 0.5 MG tablet, Take 1 tablet (0.5 mg total) by mouth daily as needed for anxiety., Disp: 30 tablet, Rfl: 0   benzonatate (TESSALON PERLES) 100 MG capsule, Take 1 capsule (100 mg total) by mouth 3 (three) times daily as needed., Disp: 20 capsule, Rfl: 0   dicyclomine (BENTYL) 10 MG capsule, Take 1 capsule (10 mg total) by mouth 4 (four) times daily -  before meals and at bedtime., Disp: 21 capsule, Rfl: 0   fluconazole (DIFLUCAN) 150 MG tablet, Take 1 tablet (150 mg total) by mouth every 3 (three) days as needed., Disp: 2 tablet, Rfl: 0   sertraline (ZOLOFT) 25 MG tablet, Take 1 tablet (25 mg total) by mouth daily for 14 days, THEN 2  tablets (50 mg total) daily for 14 days., Disp: 42 tablet, Rfl: 1   sertraline (ZOLOFT) 50 MG tablet, TAKE 1 TABLET(50 MG) BY MOUTH DAILY, Disp: 90 tablet, Rfl: 3   Medications ordered in this encounter:  Meds ordered this encounter  Medications   hydrOXYzine (VISTARIL) 25 MG capsule    Sig: Take 1 capsule (25 mg total) by mouth every 8 (eight) hours as needed.    Dispense:  30 capsule    Refill:  0    Order Specific Question:   Supervising Provider    Answer:   Sabrina White [[id number]]   predniSONE (DELTASONE) 10 MG tablet    Sig: Take 1 tablet (10 mg total) by mouth daily with breakfast for 15 days. Day 1-5 take 4 tablets daily Day 6-10 take 2 tablets daily Day 11-15 take 1 tablet daily    Dispense:  35 tablet    Refill:  0    Order Specific Question:   Supervising Provider    Answer:   Sabrina White [[id number]]     *If you need refills on other medications prior to your next appointment, please contact your pharmacy*  Follow-Up: Call back or seek an in-person evaluation if the symptoms worsen or if the condition fails to improve as anticipated.  [facility] [phone]  Other Instructions Take prednisone taper as prescribed.   Recommend topical calamine lotion or Zanfel cream.  If you have been instructed to have an in-person evaluation today at a local Urgent Care facility, please use the link below. It will take you to a list of all of our available [facility], including address, phone number and hours of operation. Please do not delay care.  [facility]  If you or a family member do not have a primary care provider, use the link below to schedule a visit and establish care. When you choose a [facility] primary care physician or advanced practice provider, you gain a long-term partner in health. Find a Primary Care Provider  Learn more about [facility]'s in-office and virtual care options: [facility]

## 2022-08-19 ENCOUNTER — Encounter: Payer: Self-pay | Admitting: Family Medicine

## 2022-08-19 ENCOUNTER — Ambulatory Visit: Payer: BC Managed Care – PPO | Admitting: Family Medicine

## 2022-08-19 VITALS — BP 136/84 | HR 84 | Temp 98.2°F | Ht 65.0 in | Wt 221.4 lb

## 2022-08-19 DIAGNOSIS — L237 Allergic contact dermatitis due to plants, except food: Secondary | ICD-10-CM | POA: Diagnosis not present

## 2022-08-19 DIAGNOSIS — G43909 Migraine, unspecified, not intractable, without status migrainosus: Secondary | ICD-10-CM | POA: Insufficient documentation

## 2022-08-19 DIAGNOSIS — L03116 Cellulitis of left lower limb: Secondary | ICD-10-CM | POA: Diagnosis not present

## 2022-08-19 DIAGNOSIS — L509 Urticaria, unspecified: Secondary | ICD-10-CM

## 2022-08-19 MED ORDER — METHYLPREDNISOLONE ACETATE 80 MG/ML IJ SUSP
80.0000 mg | Freq: Once | INTRAMUSCULAR | Status: DC
Start: 2022-08-19 — End: 2022-08-19

## 2022-08-19 MED ORDER — METHYLPREDNISOLONE ACETATE 80 MG/ML IJ SUSP
80.0000 mg | Freq: Once | INTRAMUSCULAR | Status: AC
Start: 2022-08-19 — End: 2022-08-19
  Administered 2022-08-19: 80 mg via INTRAMUSCULAR

## 2022-08-19 MED ORDER — CEPHALEXIN 500 MG PO CAPS: 500.0000 mg | ORAL_CAPSULE | Freq: Two times a day (BID) | ORAL | 0 refills | Status: DC

## 2022-08-19 MED ORDER — FAMOTIDINE 20 MG PO TABS: 20.0000 mg | ORAL_TABLET | Freq: Two times a day (BID) | ORAL | 0 refills | Status: DC

## 2022-08-19 NOTE — Patient Instructions (Signed)
Please return for your annual complete physical; please come fasting.   Start zyrtec 10 nightly along with the keflex antibiotic and pepcid twice a day.  Topical creams/benadryl/aloe as needed  If you have any questions or concerns, please don't hesitate to send me a message via MyChart or call the office at [phone]. Thank you for visiting with [place] today! It's our pleasure caring for you.

## 2022-08-19 NOTE — Progress Notes (Signed)
Subjective  CC:  Chief Complaint  Patient presents with   Poison Ivy    Pt stated that she has had poison ivy for the past 2 weeks and the worst is on her Lt thigh    HPI: Sabrina White is a 37 y.o. female who presents to the office today to address the problems listed above in the chief complaint. Highly allergic to poison ivy.  Had a virtual visit on June 15 and treated with prednisone taper and hydroxyzine.  Unfortunately, could not tolerate prednisone because it gave her headaches and some nausea.  She went out of town to [place] on vacation and symptoms worsened.  Describes worsening poison ivy rash, hives and crusting on left leg, torso arms.  New lesions are arising.  No fevers or chills.  Feels warm and itchy.  Assessment  1. Poison ivy dermatitis   2. Cellulitis of left lower extremity   3. Urticaria      Plan  Poison ivy dermatitis with secondary cellulitis and urticaria: Diffuse, treat with Depo-Medrol 80 mg IM.  She has tolerated this in the past.  Start Zyrtec 10 nightly and Pepcid 20 twice daily.  Keflex 500 twice daily to cover for cellulitis of left thigh.  Topical Benadryl creams and aloe vera for supportive care.  Follow-up in 5 to 7 days if not proving.  Follow up: Needs complete physical Visit date not found  No orders of the defined types were placed in this encounter.  Meds ordered this encounter  Medications   DISCONTD: methylPREDNISolone acetate (DEPO-MEDROL) injection 80 mg   cephALEXin (KEFLEX) 500 MG capsule    Sig: Take 1 capsule (500 mg total) by mouth 2 (two) times daily.    Dispense:  14 capsule    Refill:  0   famotidine (PEPCID) 20 MG tablet    Sig: Take 1 tablet (20 mg total) by mouth 2 (two) times daily.    Dispense:  60 tablet    Refill:  0   methylPREDNISolone acetate (DEPO-MEDROL) injection 80 mg      I reviewed the patients updated PMH, FH, and SocHx.    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   cephALEXin (KEFLEX) 500 MG capsule Take 1 capsule (500 mg total) by mouth 2 (two) times daily.   famotidine (PEPCID) 20 MG tablet Take 1 tablet (20 mg total) by mouth 2 (two) times daily.   hydrOXYzine (VISTARIL) 25 MG capsule Take 1 capsule (25 mg total) by mouth every 8 (eight) hours as needed.   sertraline (ZOLOFT) 50 MG tablet TAKE 1 TABLET(50 MG) BY MOUTH DAILY   Current Facility-Administered Medications for the 08/19/22 encounter (Office Visit) with Willow Ora, MD  Medication   methylPREDNISolone acetate (DEPO-MEDROL) injection 80 mg    Allergies: Patient is allergic to codeine, nifedipine, and raspberry. Family History: Patient family history  includes Breast cancer in her maternal grandmother, mother, and paternal aunt; Hypertension in her father. Social History:  Patient  reports that she has never smoked. She has never used smokeless tobacco. She reports current alcohol use. She reports that she does not use drugs.  Review of Systems: Constitutional: Negative for fever malaise or anorexia Cardiovascular: negative for chest pain Respiratory: negative for SOB or persistent cough Gastrointestinal: negative for abdominal pain  Objective  Vitals: BP 136/84   Pulse 84   Temp 98.2 F (36.8 C)   Ht 5\' 5"  (1.651 m)   Wt 221 lb 6.4 oz (100.4 kg)   SpO2 98%   BMI 36.84 kg/m  General: no acute distress  , A&Ox3 No respiratory distress, no facial swelling Skin: Erythematous rash on abdomen, chest, wrist, bilateral lower extremities with some crusting and blisters.  Left upper thigh with round erythematous warm area, some hives remain  Commons side effects, risks, benefits, and alternatives for medications and treatment plan prescribed today were discussed, and the patient expressed understanding of the given instructions. Patient is instructed to call or message via MyChart if he/she has any questions or concerns regarding our treatment plan. No barriers to understanding were identified. We discussed Red Flag symptoms and signs in detail. Patient expressed understanding regarding what to do in case of urgent or emergency type symptoms.  Medication list was reconciled, printed and provided to the patient in AVS. Patient instructions and summary information was reviewed with the patient as documented in the AVS. This note was prepared with assistance of Dragon voice recognition software. Occasional wrong-word or sound-a-like substitutions may have occurred due to the inherent limitations of voice recognition software

## 2022-08-24 ENCOUNTER — Encounter (HOSPITAL_COMMUNITY): Payer: Self-pay | Admitting: *Deleted

## 2022-08-25 ENCOUNTER — Encounter: Payer: Self-pay | Admitting: Family Medicine

## 2022-08-26 MED ORDER — FLUCONAZOLE 150 MG PO TABS
ORAL_TABLET | ORAL | 0 refills | Status: DC
Start: 2022-08-26 — End: 2022-11-17

## 2022-10-05 ENCOUNTER — Encounter: Payer: Self-pay | Admitting: Family Medicine

## 2022-10-05 ENCOUNTER — Ambulatory Visit: Payer: BC Managed Care – PPO | Admitting: Family Medicine

## 2022-10-05 VITALS — BP 126/84 | HR 79 | Temp 98.1°F | Ht 65.0 in | Wt 217.6 lb

## 2022-10-05 DIAGNOSIS — L255 Unspecified contact dermatitis due to plants, except food: Secondary | ICD-10-CM

## 2022-10-05 MED ORDER — METHYLPREDNISOLONE ACETATE 80 MG/ML IJ SUSP
80.0000 mg | Freq: Once | INTRAMUSCULAR | Status: AC
Start: 2022-10-05 — End: 2022-10-05
  Administered 2022-10-05: 80 mg via INTRAMUSCULAR

## 2022-10-05 NOTE — Progress Notes (Signed)
Subjective  CC:  Chief Complaint  Patient presents with   Poison Ivy    HPI: Sabrina White is a 37 y.o. female who presents to the office today to address the problems listed above in the chief complaint. 37 yo with long h/o moderate to severe reactions to poison ivy with new rash x 2 days: typical PI rash on left upper extremity and right upper eyelid. Typically needs high dose steroid injection due to severe reactions. Has used calamine lotion. No systemic sxs. Wants to treat early; had prolonged case in June. See notes.    Assessment  1. Rhus dermatitis      Plan  Poison ivy:  depo-medrol 80mg  IM administered in office. Skin care routine guidance  Follow up: as scheduled 11/17/2022  No orders of the defined types were placed in this encounter.  Meds ordered this encounter  Medications   methylPREDNISolone acetate (DEPO-MEDROL) injection 80 mg      I reviewed the patients updated PMH, FH, and SocHx.    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   fluconazole (DIFLUCAN) 150 MG tablet Take one tablet today; may repeat in 3 days if symptoms persist   sertraline (ZOLOFT) 50 MG tablet TAKE 1 TABLET(50 MG) BY MOUTH DAILY   Current Facility-Administered Medications for the 10/05/22 encounter (Office Visit) with Willow Ora, MD  Medication   methylPREDNISolone acetate  (DEPO-MEDROL) injection 80 mg    Allergies: Patient is allergic to codeine, nifedipine, and raspberry. Family History: Patient family history includes Breast cancer in her maternal grandmother, mother, and paternal aunt; Hypertension in her father. Social History:  Patient  reports that she has never smoked. She has never used smokeless tobacco. She reports current alcohol use. She reports that she does not use drugs.  Review of Systems: Constitutional: Negative for fever malaise or anorexia Cardiovascular: negative for chest pain Respiratory: negative for SOB or persistent cough Gastrointestinal: negative for abdominal pain  Objective  Vitals: BP 126/84   Pulse 79   Temp 98.1 F (36.7 C)   Ht 5\' 5"  (1.651 m)   Wt 217 lb 9.6 oz (98.7 kg)   SpO2 97%   BMI 36.21 kg/m  General: no acute distress , A&Ox3 HEENT: PEERL, conjunctiva normal, neck is supple Skin:  Warm, vesicular erythematous patch on right UE and right upper eyelid w/ swelling and rash  Commons side effects, risks, benefits, and alternatives for medications and treatment plan prescribed today were discussed, and the patient expressed understanding of the given instructions. Patient is instructed to call or message via MyChart if he/she has any questions or concerns regarding our treatment plan. No barriers to understanding were identified. We discussed Red Flag symptoms and signs in detail. Patient expressed understanding regarding what to do in case of urgent or emergency type symptoms.  Medication list was reconciled, printed and provided to  the patient in AVS. Patient instructions and summary information was reviewed with the patient as documented in the AVS. This note was prepared with assistance of Dragon voice recognition software. Occasional wrong-word or sound-a-like substitutions may have occurred due to the inherent limitations of voice recognition software

## 2022-10-07 ENCOUNTER — Encounter: Payer: Self-pay | Admitting: Family Medicine

## 2022-10-07 MED ORDER — PREDNISONE 20 MG PO TABS: ORAL_TABLET | ORAL | 0 refills | Status: DC

## 2022-10-08 ENCOUNTER — Encounter (INDEPENDENT_AMBULATORY_CARE_PROVIDER_SITE_OTHER): Payer: Self-pay

## 2022-10-17 ENCOUNTER — Other Ambulatory Visit: Payer: Self-pay | Admitting: Family Medicine

## 2022-10-19 MED ORDER — ALPRAZOLAM 0.5 MG PO TABS: 0.5000 mg | ORAL_TABLET | Freq: Every day | ORAL | 0 refills | Status: DC | PRN

## 2022-10-19 NOTE — Telephone Encounter (Signed)
Last OV: 10/05/22  Next OV; 11/17/22  Last Filled: 07/21/22  Quantity: 30 w/ 0 refills   Please advise refill

## 2022-11-10 ENCOUNTER — Other Ambulatory Visit: Payer: Self-pay | Admitting: Family Medicine

## 2022-11-17 ENCOUNTER — Ambulatory Visit (INDEPENDENT_AMBULATORY_CARE_PROVIDER_SITE_OTHER): Payer: BC Managed Care – PPO | Admitting: Family Medicine

## 2022-11-17 ENCOUNTER — Encounter: Payer: Self-pay | Admitting: Family Medicine

## 2022-11-17 VITALS — BP 120/86 | HR 81 | Temp 97.8°F | Ht 65.0 in | Wt 218.8 lb

## 2022-11-17 DIAGNOSIS — E282 Polycystic ovarian syndrome: Secondary | ICD-10-CM

## 2022-11-17 DIAGNOSIS — J301 Allergic rhinitis due to pollen: Secondary | ICD-10-CM

## 2022-11-17 DIAGNOSIS — Z9884 Bariatric surgery status: Secondary | ICD-10-CM

## 2022-11-17 DIAGNOSIS — Z0001 Encounter for general adult medical examination with abnormal findings: Secondary | ICD-10-CM

## 2022-11-17 DIAGNOSIS — E559 Vitamin D deficiency, unspecified: Secondary | ICD-10-CM | POA: Diagnosis not present

## 2022-11-17 DIAGNOSIS — L255 Unspecified contact dermatitis due to plants, except food: Secondary | ICD-10-CM | POA: Insufficient documentation

## 2022-11-17 DIAGNOSIS — F411 Generalized anxiety disorder: Secondary | ICD-10-CM

## 2022-11-17 DIAGNOSIS — Z13220 Encounter for screening for lipoid disorders: Secondary | ICD-10-CM

## 2022-11-17 DIAGNOSIS — Z8679 Personal history of other diseases of the circulatory system: Secondary | ICD-10-CM | POA: Insufficient documentation

## 2022-11-17 DIAGNOSIS — H60543 Acute eczematoid otitis externa, bilateral: Secondary | ICD-10-CM | POA: Diagnosis not present

## 2022-11-17 DIAGNOSIS — Z23 Encounter for immunization: Secondary | ICD-10-CM | POA: Diagnosis not present

## 2022-11-17 LAB — COMPREHENSIVE METABOLIC PANEL
ALT: 25 U/L (ref 0–35)
AST: 22 U/L (ref 0–37)
Albumin: 4 g/dL (ref 3.5–5.2)
Alkaline Phosphatase: 79 U/L (ref 39–117)
BUN: 18 mg/dL (ref 6–23)
CO2: 26 mEq/L (ref 19–32)
Calcium: 9 mg/dL (ref 8.4–10.5)
Chloride: 104 mEq/L (ref 96–112)
Creatinine, Ser: 0.72 mg/dL (ref 0.40–1.20)
GFR: 106.96 mL/min (ref >60.00)
Glucose, Bld: 94 mg/dL (ref 70–99)
Potassium: 4.1 mEq/L (ref 3.5–5.1)
Sodium: 138 mEq/L (ref 135–145)
Total Bilirubin: 0.5 mg/dL (ref 0.2–1.2)
Total Protein: 6.7 g/dL (ref 6.0–8.3)

## 2022-11-17 LAB — LIPID PANEL
Cholesterol: 187 mg/dL (ref 0–200)
HDL: 41.5 mg/dL (ref >39.00)
LDL Cholesterol: 116 mg/dL — ABNORMAL HIGH (ref 0–99)
NonHDL: 145.57
Total CHOL/HDL Ratio: 5
Triglycerides: 148 mg/dL (ref 0.0–149.0)
VLDL: 29.6 mg/dL (ref 0.0–40.0)

## 2022-11-17 LAB — CBC WITH DIFFERENTIAL/PLATELET
Basophils Absolute: 0.1 10*3/uL (ref 0.0–0.1)
Basophils Relative: 0.6 % (ref 0.0–3.0)
Eosinophils Absolute: 0.3 10*3/uL (ref 0.0–0.7)
Eosinophils Relative: 2.6 % (ref 0.0–5.0)
HCT: 41 % (ref 36.0–46.0)
Hemoglobin: 13.3 g/dL (ref 12.0–15.0)
Lymphocytes Relative: 22.9 % (ref 12.0–46.0)
Lymphs Abs: 2.6 10*3/uL (ref 0.7–4.0)
MCHC: 32.6 g/dL (ref 30.0–36.0)
MCV: 85.4 fl (ref 78.0–100.0)
Monocytes Absolute: 0.6 10*3/uL (ref 0.1–1.0)
Monocytes Relative: 5.2 % (ref 3.0–12.0)
Neutro Abs: 7.7 10*3/uL (ref 1.4–7.7)
Neutrophils Relative %: 68.7 % (ref 43.0–77.0)
Platelets: 282 10*3/uL (ref 150.0–400.0)
RBC: 4.8 Mil/uL (ref 3.87–5.11)
RDW: 13.2 % (ref 11.5–15.5)
WBC: 11.2 10*3/uL — ABNORMAL HIGH (ref 4.0–10.5)

## 2022-11-17 LAB — VITAMIN B12: Vitamin B-12: 333 pg/mL (ref 211–911)

## 2022-11-17 LAB — TSH: TSH: 2.15 u[IU]/mL (ref 0.35–5.50)

## 2022-11-17 LAB — HEMOGLOBIN A1C: Hgb A1c MFr Bld: 5.6 % (ref 4.6–6.5)

## 2022-11-17 LAB — VITAMIN D 25 HYDROXY (VIT D DEFICIENCY, FRACTURES): VITD: 26.8 ng/mL — ABNORMAL LOW (ref 30.00–100.00)

## 2022-11-17 MED ORDER — SERTRALINE HCL 100 MG PO TABS: 100.0000 mg | ORAL_TABLET | Freq: Every day | ORAL | 3 refills | Status: DC

## 2022-11-17 NOTE — Patient Instructions (Signed)
Please return in 12 months for your annual complete physical; please come fasting.   I will release your lab results to you on your MyChart account with further instructions. You may see the results before I do, but when I review them I will send you a message with my report or have my assistant call you if things need to be discussed. Please reply to my message with any questions. Thank you!   If you have any questions or concerns, please don't hesitate to send me a message via MyChart or call the office at [phone]. Thank you for visiting with Sabrina White today! It's our pleasure caring for you.   Preventive Care 24-37 Years Old, Female Preventive care refers to lifestyle choices and visits with your health care provider that can promote health and wellness. Preventive care visits are also called wellness exams. What can I expect for my preventive care visit? Counseling During your preventive care visit, your health care provider may ask about your: Medical history, including: Past medical problems. Family medical history. Pregnancy history. Current health, including: Menstrual cycle. Method of birth control. Emotional well-being. Home life and relationship well-being. Sexual activity and sexual health. Lifestyle, including: Alcohol, nicotine or tobacco, and drug use. Access to firearms. Diet, exercise, and sleep habits. Work and work Astronomer. Sunscreen use. Safety issues such as seatbelt and bike helmet use. Physical exam Your health care provider may check your: Height and weight. These may be used to calculate your BMI (body mass index). BMI is a measurement that tells if you are at a healthy weight. Waist circumference. This measures the distance around your waistline. This measurement also tells if you are at a healthy weight and may help predict your risk of certain diseases, such as type 2 diabetes and high blood pressure. Heart rate and blood pressure. Body  temperature. Skin for abnormal spots. What immunizations do I need?  Vaccines are usually given at various ages, according to a schedule. Your health care provider will recommend vaccines for you based on your age, medical history, and lifestyle or other factors, such as travel or where you work. What tests do I need? Screening Your health care provider may recommend screening tests for certain conditions. This may include: Pelvic exam and Pap test. Lipid and cholesterol levels. Diabetes screening. This is done by checking your blood sugar (glucose) after you have not eaten for a while (fasting). Hepatitis B test. Hepatitis C test. HIV (human immunodeficiency virus) test. STI (sexually transmitted infection) testing, if you are at risk. BRCA-related cancer screening. This may be done if you have a family history of breast, ovarian, tubal, or peritoneal cancers. Talk with your health care provider about your test results, treatment options, and if necessary, the need for more tests. Follow these instructions at home: Eating and drinking  Eat a healthy diet that includes fresh fruits and vegetables, whole grains, lean protein, and low-fat dairy products. Take vitamin and mineral supplements as recommended by your health care provider. Do not drink alcohol if: Your health care provider tells you not to drink. You are pregnant, may be pregnant, or are planning to become pregnant. If you drink alcohol: Limit how much you have to 0-1 drink a day. Know how much alcohol is in your drink. In the [place] one drink equals one 12 oz bottle of beer (355 mL), one 5 oz glass of wine (148 mL), or one 1 oz glass of hard liquor (44 mL). Lifestyle Brush your teeth every morning and  night with fluoride toothpaste. Floss one time each day. Exercise for at least 30 minutes 5 or more days each week. Do not use any products that contain nicotine or tobacco. These products include cigarettes, chewing tobacco,  and vaping devices, such as e-cigarettes. If you need help quitting, ask your health care provider. Do not use drugs. If you are sexually active, practice safe sex. Use a condom or other form of protection to prevent STIs. If you do not wish to become pregnant, use a form of birth control. If you plan to become pregnant, see your health care provider for a prepregnancy visit. Find healthy ways to manage stress, such as: Meditation, yoga, or listening to music. Journaling. Talking to a trusted person. Spending time with friends and family. Minimize exposure to UV radiation to reduce your risk of skin cancer. Safety Always wear your seat belt while driving or riding in a vehicle. Do not drive: If you have been drinking alcohol. Do not ride with someone who has been drinking. If you have been using any mind-altering substances or drugs. While texting. When you are tired or distracted. Wear a helmet and other protective equipment during sports activities. If you have firearms in your house, make sure you follow all gun safety procedures. Seek help if you have been physically or sexually abused. What's next? Go to your health care provider once a year for an annual wellness visit. Ask your health care provider how often you should have your eyes and teeth checked. Stay up to date on all vaccines. This information is not intended to replace advice given to you by your health care provider. Make sure you discuss any questions you have with your health care provider. Document Revised: 08/07/2020 Document Reviewed: 08/07/2020 Elsevier Patient Education  2024 [employer].

## 2022-11-17 NOTE — Progress Notes (Signed)
Subjective  Chief Complaint  Patient presents with   Annual Exam   Hypertension    HPI: Sabrina White is a 37 y.o. female who presents to [facility] [place] today for a Female Wellness Visit.  She also has the concerns and/or needs as listed above in the chief complaint. These will be addressed in addition to the Health Maintenance Visit.   Wellness Visit: annual visit with health maintenance review and exam Health maintenance: Pap smear is done by Dr. Ernestina Penna and are current.  We are calling for records.  Exercises regularly.  Healthy diet.  Weight loss is persistent.  Flu shot eligible today.  Chronic disease management visit and/or acute problem visit: PCOS with regular menstrual cycles. History of hypertension: Has been off medications and blood pressure is stable however diastolics are running in the 80s.  She does have a home blood pressure cuff.  Feels well without chest pain or shortness of breath.  Low-sodium diet.  She is fasting this morning for labs Vitamin D deficiency, gastric bypass status.  Occasionally takes supplements. Generalized anxiety disorder: Fortunately has been doing much better on Zoloft 50 mg daily.  Has a hectic life with 2 young children, full-time work, partner with full-time job etc.  Handling stressors well however feels like she is constantly up against something.  No depressive symptoms.  No recent panic attacks.  However is using Xanax about twice weekly to get through her weeks.  Has never been on a higher dose of Zoloft. Chronic eczema: Would like this rechecked.  No pain or itching or flaking.  Uses a oil-based steroid solution as needed. Complains of postnasal drainage and did not clear her throat in the morning over the last few weeks.  Some sneezing.  Not on any medicines for allergies.  Assessment  1. Encounter for well adult exam with abnormal findings   2. Need for influenza vaccination   3. PCOS (polycystic ovarian  syndrome)   4. History of hypertension   5. Vitamin D deficiency   6. 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/2020   7. GAD (generalized anxiety disorder)   8. Eczema of both external ears   9. Seasonal allergic rhinitis due to pollen      Plan  Female Wellness Visit: Age appropriate Health Maintenance and Prevention measures were discussed with patient. Included topics are cancer screening recommendations, ways to keep healthy (see AVS) including dietary and exercise recommendations, regular eye and dental care, use of seat belts, and avoidance of moderate alcohol use and tobacco use.  Will get Pap smear records BMI: discussed patient's BMI and encouraged positive lifestyle modifications to help get to or maintain a target BMI. HM needs and immunizations were addressed and ordered. See below for orders. See HM and immunization section for updates.  Flu shot given today Routine labs and screening tests ordered including cmp, cbc and lipids where appropriate. Discussed recommendations regarding Vit D and calcium supplementation (see AVS)  Chronic disease f/u and/or acute problem visit: (deemed necessary to be done in addition to the wellness visit): PCOS: Continue regular exercise and low-carb diet.  Monitor A1c History of hypertension: Will start home blood pressure monitoring.  Low-sodium diet.  High risk for crossing over into hypertension again. Gastric bypass status: Will check vitamin D, B12 and iron levels.  Weight loss has persisted.  She continues regular exercise and healthy diet Seasonal allergies: Start over-the-counter oral antihistamine Eczema, otic: Currently stable Generalized anxiety disorder: Counseling done.  Fairly well-controlled but not completely.  Will increase Zoloft to 100 mg daily.  Education counseling provided.  Discussed goal of using Xanax rarely once anxiety is very well-controlled.  She will let me know if she has any problems with increasing the dose.  Follow  up: 12 months for your complete annual physical exam with blood work. Please come fasting.  Orders Placed This Encounter  Procedures   Flu vaccine trivalent PF, 6mos and older(Flulaval,Afluria,Fluarix,Fluzone)   VITAMIN D 25 Hydroxy (Vit-D Deficiency, Fractures)   CBC with Differential/Platelet   Comprehensive metabolic panel   Lipid panel   TSH   Hemoglobin A1c   Vitamin B12   Iron, TIBC and Ferritin Panel   Meds ordered this encounter  Medications   sertraline (ZOLOFT) 100 MG tablet    Sig: Take 1 tablet (100 mg total) by mouth daily.    Dispense:  90 tablet    Refill:  3       Body mass index is 36.41 kg/m. 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 Paternal aunt, negative BRCA gene tested.    PCOS (polycystic ovarian syndrome) 04/01/2015     Priority: High   Migraine 08/19/2022     Priority: Medium    Hiatal hernia 02/12/2021     Priority: Medium     repaired 01/13/2021    History of classical cesarean section 07/27/2019     Priority: Medium    History of maternal deep vein thrombosis (DVT) 05/10/2019     Priority: Medium    Seasonal allergic rhinitis due to pollen 11/17/2022     Priority: Low   Rhus dermatitis 11/17/2022     Priority: Low    Severely allergic    Vitamin D  deficiency 10/27/2019     Priority: Low   Eczema of external ear 07/14/2018     Priority: Low    PENTA in [place], oil based steroid suspension as needed    Health Maintenance  Topic Date Due   COVID-19 Vaccine (4 - 2023-24 season) 12/03/2022 (Originally 10/25/2022)   Cervical Cancer Screening (HPV/Pap Cotest)  03/13/2027   DTaP/Tdap/Td (3 - Td or Tdap) 03/21/2029   INFLUENZA VACCINE  Completed   Hepatitis C Screening  Completed   HIV Screening  Completed   HPV VACCINES  Aged Out   Immunization History  Administered Date(s) Administered   Influenza, Seasonal, Injecte, Preservative Fre 01/24/2015, 11/17/2022   Influenza,inj,Quad PF,6+ Mos 10/21/2017, 12/06/2018, 10/27/2019, 02/12/2021   Influenza-Unspecified 12/24/2016   Moderna SARS-COV2 Booster Vaccination 02/14/2020   PFIZER(Purple Top)SARS-COV-2 Vaccination 05/21/2019, 06/11/2019   Tdap 04/28/2010, 03/22/2019   We updated and reviewed the patient's past history in detail and it is documented  below. Allergies: Patient  reports current alcohol use. 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 Past Surgical History Patient  has a past surgical history that includes Cesarean section (N/A, 05/06/2019); Esophagogastroduodenoscopy endoscopy (11/19/2020); Gastric Roux-En-Y (N/A, 01/13/2021); and Hiatal hernia repair (N/A, 01/13/2021). Social History   Socioeconomic History   Marital status: Married    Spouse name: Not on file   Number of children: Not on file   Years of education: Not on file   Highest education level: Not on file  Occupational History    Employer: [employer]  Tobacco Use   Smoking  status: Never   Smokeless tobacco: Never  Vaping Use   Vaping status: Never Used  Substance and Sexual Activity   Alcohol use: Yes    Comment: occasional   Drug use: No   Sexual activity: Yes  Other Topics Concern   Not on file  Social History Narrative   Not on file   Social Determinants of Health   Financial Resource Strain: Not on file  Food Insecurity: Not on file  Transportation Needs: Not on file  Physical Activity: Not on file  Stress: Not on file  Social Connections: Not on file   Family History  Problem Relation Age of Onset   Breast cancer Mother    Hypertension Father    Breast cancer Paternal Aunt    Breast cancer Maternal Grandmother    Colon cancer Neg Hx    Pancreatic cancer Neg Hx    Esophageal cancer Neg Hx    Liver cancer Neg Hx    Stomach cancer Neg Hx    Colon polyps Neg Hx     Review of Systems: Constitutional: negative for fever or malaise Ophthalmic: negative for photophobia, double vision or loss of vision Cardiovascular: negative for chest pain, dyspnea on exertion, or new LE swelling Respiratory: negative for SOB or persistent cough Gastrointestinal: negative for abdominal pain, change in bowel habits or melena Genitourinary: negative for dysuria or gross hematuria, no abnormal uterine bleeding or disharge Musculoskeletal: negative for new gait disturbance or muscular weakness Integumentary: negative for new or persistent rashes, no breast lumps Neurological: negative for TIA or stroke symptoms Psychiatric: negative for SI or delusions Allergic/Immunologic: negative for hives  Patient Care Team    Relationship Specialty Notifications Start End  Willow Ora, MD PCP - General Family Medicine  04/05/17   Noland Fordyce, MD Consulting Physician Obstetrics and Gynecology  11/17/22     Objective  Vitals: BP 120/86   Pulse 81   Temp 97.8 F (36.6 C)   Ht 5\' 5"  (1.651 m)   Wt 218 lb 12.8 oz (99.2 kg)   SpO2 96%   BMI 36.41 kg/m   General:  Well developed, well nourished, no acute distress  Psych:  Alert and orientedx3,normal mood and affect HEENT:  Normocephalic, atraumatic, non-icteric sclera, PERRL, supple neck without adenopathy, mass or thyromegaly Cardiovascular:  Normal S1, S2, RRR without gallop, rub or murmur Respiratory:  Good breath sounds bilaterally, CTAB with normal respiratory effort Gastrointestinal: normal bowel sounds, soft, non-tender, no noted masses. No HSM MSK: no deformities, contusions. Joints are without erythema or swelling.  Skin:  Warm, no rashes or suspicious lesions noted Neurologic:    Mental status  is normal. Gross motor and sensory exams are normal. Normal gait. No tremor    Commons side effects, risks, benefits, and alternatives for medications and treatment plan prescribed today were discussed, and the patient expressed understanding of the given instructions. Patient is instructed to call or message via MyChart if he/she has any questions or concerns regarding our treatment plan. No barriers to understanding were identified. We discussed Red Flag symptoms and signs in detail. Patient expressed understanding regarding what to do in case of urgent or emergency type symptoms.  Medication list was reconciled, printed and provided to the patient in AVS. Patient instructions and summary information was reviewed with the patient as documented in the AVS. This note was prepared with assistance of Dragon voice recognition software. Occasional wrong-word or sound-a-like substitutions may have occurred due to the inherent limitations of voice recognition software .

## 2022-11-18 LAB — IRON,TIBC AND FERRITIN PANEL
%SAT: 25 % (calc) (ref 16–45)
Ferritin: 6 ng/mL — ABNORMAL LOW (ref 16–154)
Iron: 89 ug/dL (ref 40–190)
TIBC: 352 mcg/dL (calc) (ref 250–450)

## 2022-11-19 ENCOUNTER — Encounter: Payer: Self-pay | Admitting: Family Medicine

## 2022-11-19 DIAGNOSIS — E611 Iron deficiency: Secondary | ICD-10-CM | POA: Insufficient documentation

## 2022-11-19 DIAGNOSIS — E538 Deficiency of other specified B group vitamins: Secondary | ICD-10-CM | POA: Insufficient documentation

## 2022-11-19 NOTE — Progress Notes (Signed)
See mychart note Dear Ms. Paredez, Your labs look great except for your iron, vitamin D and vitamin B. As expected, all are low. You are not yet anemic which is good.  Please take OTD Vitamin D 4000 units daily, vitamin B12 1000 mg daily and over-the-counter iron 325 mg daily.  Everything else looks great glad you are feeling well Sincerely, Dr. Mardelle Matte

## 2023-01-10 ENCOUNTER — Telehealth: Payer: BC Managed Care – PPO | Admitting: Family

## 2023-01-10 DIAGNOSIS — J029 Acute pharyngitis, unspecified: Secondary | ICD-10-CM | POA: Diagnosis not present

## 2023-01-10 MED ORDER — FLUCONAZOLE 150 MG PO TABS: 150.0000 mg | ORAL_TABLET | ORAL | 0 refills | Status: DC | PRN

## 2023-01-10 MED ORDER — AMOXICILLIN 500 MG PO CAPS
500.0000 mg | ORAL_CAPSULE | Freq: Two times a day (BID) | ORAL | 0 refills | Status: AC
Start: 2023-01-10 — End: 2023-01-20

## 2023-01-10 NOTE — Progress Notes (Signed)
Virtual Visit Consent   Sabrina White, you are scheduled for a virtual visit with a [facility] provider today. Just as with appointments in the office, your consent must be obtained to participate. Your consent will be active for this visit and any virtual visit you may have with one of our providers in the next 365 days. If you have a MyChart account, a copy of this consent can be sent to you electronically.  As this is a virtual visit, video technology does not allow for your provider to perform a traditional examination. This may limit your provider's ability to fully assess your condition. If your provider identifies any concerns that need to be evaluated in person or the need to arrange testing (such as labs, EKG, etc.), we will make arrangements to do so. Although advances in technology are sophisticated, we cannot ensure that it will always work on either your end or our end. If the connection with a video visit is poor, the visit may have to be switched to a telephone visit. With either a video or telephone visit, we are not always able to ensure that we have a secure connection.  By engaging in this virtual visit, you consent to the provision of healthcare and authorize for your insurance to be billed (if applicable) for the services provided during this visit. Depending on your insurance coverage, you may receive a charge related to this service.  I need to obtain your verbal consent now. Are you willing to proceed with your visit today? Sabrina White has provided verbal consent on 01/10/2023 for a virtual visit (video or telephone). Sabrina Rodney, FNP  Date: 01/10/2023 6:01 PM  Virtual Visit via Video Note   I, Sabrina White, connected with  Sabrina White  ([id number], 37-31-1987) on 01/10/23 at  6:00 PM EST by a video-enabled telemedicine application and verified that I am speaking with the correct person using two identifiers.  Location: Patient: Virtual Visit Location  Patient: Home Provider: Virtual Visit Location Provider: Home Office   I discussed the limitations of evaluation and management by telemedicine and the availability of in person appointments. The patient expressed understanding and agreed to proceed.    History of Present Illness: Sabrina White is a 37 y.o. who identifies as a female who was assigned female at birth, and is being seen today for sore throat for over a week.  HPI: Sore Throat  This is a new problem. The current episode started 1 to 4 weeks ago. The problem has been gradually worsening. There has been no fever. The pain is at a severity of 5/10. The pain is moderate. Associated symptoms include congestion, coughing, headaches, swollen glands and trouble swallowing. Pertinent negatives include no ear discharge, ear pain or shortness of breath. She has tried acetaminophen and NSAIDs for the symptoms. The treatment provided mild relief.    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   Raspberry Other (See Comments)    Causes blisters on lips   Medications:  Current Outpatient Medications:     amoxicillin (AMOXIL) 500 MG capsule, Take 1 capsule (500 mg total) by mouth 2 (two) times daily for 10 days., Disp: 20 capsule, Rfl: 0   ALPRAZolam (XANAX) 0.5 MG tablet, Take 1 tablet (0.5 mg total) by mouth daily as needed for anxiety., Disp: 30 tablet, Rfl: 0   sertraline (ZOLOFT) 100 MG tablet, Take 1 tablet (100 mg total) by mouth daily., Disp: 90 tablet, Rfl: 3  Observations/Objective: Patient is well-developed, well-nourished in no acute distress.  Resting comfortably  at home.  Head is normocephalic, atraumatic.  No labored breathing.  Speech is clear and coherent with logical content.  Patient is alert and oriented at baseline.  Tonsils erythemas and white patches bilateral   Assessment and Plan: 1. Acute pharyngitis, unspecified etiology - amoxicillin (AMOXIL) 500 MG capsule; Take 1 capsule (500 mg total) by mouth 2 (two) times daily for 10 days.  Dispense: 20 capsule; Refill: 0  - Take meds as prescribed - Use a cool mist humidifier  -Use saline nose sprays frequently -Force fluids -For any cough or congestion  Use plain Mucinex- regular strength or max strength is fine -For fever or aces or pains- take tylenol or ibuprofen. -Throat lozenges if help -New toothbrush in 3 days     Follow Up Instructions: I discussed the assessment and treatment plan with the patient. The patient was provided an opportunity to ask questions and all were answered. The patient agreed with the plan and demonstrated an understanding of the instructions.  A copy of instructions were sent to the patient via MyChart unless otherwise noted below.     The patient was advised to call back or seek an in-person evaluation if the symptoms worsen or if the condition fails to improve as anticipated.    Sabrina Rodney, FNP

## 2023-02-04 ENCOUNTER — Telehealth: Payer: BC Managed Care – PPO | Admitting: Physician Assistant

## 2023-02-04 DIAGNOSIS — J329 Chronic sinusitis, unspecified: Secondary | ICD-10-CM

## 2023-02-04 MED ORDER — AZITHROMYCIN 250 MG PO TABS: ORAL_TABLET | ORAL | 0 refills | Status: AC

## 2023-02-04 MED ORDER — GUAIFENESIN ER 600 MG PO TB12: 600.0000 mg | ORAL_TABLET | Freq: Two times a day (BID) | ORAL | 0 refills | Status: AC

## 2023-02-04 MED ORDER — FLUTICASONE PROPIONATE 50 MCG/ACT NA SUSP: 2.0000 | Freq: Every day | NASAL | 0 refills | Status: DC

## 2023-02-04 NOTE — Progress Notes (Signed)
Virtual Visit Consent   Sabrina White, you are scheduled for a virtual visit with a [facility] provider today. Just as with appointments in the office, your consent must be obtained to participate. Your consent will be active for this visit and any virtual visit you may have with one of our providers in the next 365 days. If you have a MyChart account, a copy of this consent can be sent to you electronically.  As this is a virtual visit, video technology does not allow for your provider to perform a traditional examination. This may limit your provider's ability to fully assess your condition. If your provider identifies any concerns that need to be evaluated in person or the need to arrange testing (such as labs, EKG, etc.), we will make arrangements to do so. Although advances in technology are sophisticated, we cannot ensure that it will always work on either your end or our end. If the connection with a video visit is poor, the visit may have to be switched to a telephone visit. With either a video or telephone visit, we are not always able to ensure that we have a secure connection.  By engaging in this virtual visit, you consent to the provision of healthcare and authorize for your insurance to be billed (if applicable) for the services provided during this visit. Depending on your insurance coverage, you may receive a charge related to this service.  I need to obtain your verbal consent now. Are you willing to proceed with your visit today? Sabrina White has provided verbal consent on 02/04/2023 for a virtual visit (video or telephone). Karrie Meres, [place]  Date: 02/04/2023 3:37 PM  Virtual Visit via Video Note   I, Rochelle Larue S Jailee Jaquez, connected with  Sabrina White  ([id number], 11/06/1985) on 02/04/23 at  3:30 PM EST by a video-enabled telemedicine application and verified that I am speaking with the correct person using two identifiers.  Location: Patient: Virtual Visit  Location Patient: Home Provider: Virtual Visit Location Provider: Home Office   I discussed the limitations of evaluation and management by telemedicine and the availability of in person appointments. The patient expressed understanding and agreed to proceed.    History of Present Illness: Sabrina White is a 37 y.o. who identifies as a female who was assigned female at birth, and is being seen today for uri sxs. Pt states this morning she woke up with mucous, sinus pressure, post nasal drip. She has not had a fever.   She denies fevers.  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   Raspberry Other (See Comments)  Causes blisters on lips   Medications:  Current Outpatient Medications:    [START ON 02/11/2023] azithromycin (ZITHROMAX) 250 MG tablet, Take 2 tablets on day 1, then 1 tablet daily on days 2 through 5, Disp: 6 tablet, Rfl: 0   fluticasone (FLONASE) 50 MCG/ACT nasal spray, Place 2 sprays into both nostrils daily., Disp: 16 g, Rfl: 0   guaiFENesin (MUCINEX) 600 MG 12 hr tablet, Take 1 tablet (600 mg total) by mouth 2 (two) times  daily for 7 days., Disp: 14 tablet, Rfl: 0   ALPRAZolam (XANAX) 0.5 MG tablet, Take 1 tablet (0.5 mg total) by mouth daily as needed for anxiety., Disp: 30 tablet, Rfl: 0   fluconazole (DIFLUCAN) 150 MG tablet, Take 1 tablet (150 mg total) by mouth every three (3) days as needed., Disp: 3 tablet, Rfl: 0   sertraline (ZOLOFT) 100 MG tablet, Take 1 tablet (100 mg total) by mouth daily., Disp: 90 tablet, Rfl: 3  Observations/Objective: Patient is well-developed, well-nourished in no acute distress.  Resting comfortably  at home.  Head is normocephalic, atraumatic.  No labored breathing.  Speech is clear and coherent with logical content.  Patient is alert and oriented at baseline.    Assessment and Plan: 1. Sinusitis, unspecified chronicity, unspecified location (Primary)  Pt likely has viral uri. Sxs onset <24 hours. Discussed that this is likely viral and abx are not currently indicated or recommended. Rx for fluticasone and mucinex sent. Advised to watch and wait and if sxs persist or worsen she can fill rx for azithromycin.   Follow Up Instructions: I discussed the assessment and treatment plan with the patient. The patient was provided an opportunity to ask questions and all were answered. The patient agreed with the plan and demonstrated an understanding of the instructions.  A copy of instructions were sent to the patient via MyChart unless otherwise noted below.   The patient was advised to call back or seek an in-person evaluation if the symptoms worsen or if the condition fails to improve as anticipated.    Bresha Hosack Manfred Shirts, PA-C

## 2023-02-04 NOTE — Patient Instructions (Signed)
Graceann Derrill Kay, thank you for joining [employer], PA-C for today's virtual visit.  While this provider is not your primary care provider (PCP), if your PCP is located in our provider database this encounter information will be shared with them immediately following your visit.   A [facility] MyChart account gives you access to today's visit and all your visits, tests, and labs performed at [facility] " click here if you don't have a [facility] MyChart account or go to [email or web address]  Consent: (Patient) Sabrina White provided verbal consent for this virtual visit at the beginning of the encounter.  Current Medications:  Current Outpatient Medications:    [START ON 02/11/2023] azithromycin (ZITHROMAX) 250 MG tablet, Take 2 tablets on day 1, then 1 tablet daily on days 2 through 5, Disp: 6 tablet, Rfl: 0   fluticasone (FLONASE) 50 MCG/ACT nasal spray, Place 2 sprays into both nostrils daily., Disp: 16 g, Rfl: 0   guaiFENesin (MUCINEX) 600 MG 12 hr tablet, Take 1 tablet (600 mg total) by mouth 2 (two) times daily for 7 days., Disp: 14 tablet, Rfl: 0   ALPRAZolam (XANAX) 0.5 MG tablet, Take 1 tablet (0.5 mg total) by mouth daily as needed for anxiety., Disp: 30 tablet, Rfl: 0   fluconazole (DIFLUCAN) 150 MG tablet, Take 1 tablet (150 mg total) by mouth every three (3) days as needed., Disp: 3 tablet, Rfl: 0   sertraline (ZOLOFT) 100 MG tablet, Take 1 tablet (100 mg total) by mouth daily., Disp: 90 tablet, Rfl: 3   Medications ordered in this encounter:  Meds ordered this encounter  Medications   fluticasone (FLONASE) 50 MCG/ACT nasal spray    Sig: Place 2 sprays into both nostrils daily.    Dispense:  16 g    Refill:  0   guaiFENesin (MUCINEX) 600 MG 12 hr tablet    Sig: Take 1 tablet (600 mg total) by mouth 2 (two) times daily for 7 days.    Dispense:  14 tablet    Refill:  0   azithromycin (ZITHROMAX) 250 MG tablet    Sig: Take 2 tablets on day 1,  then 1 tablet daily on days 2 through 5    Dispense:  6 tablet    Refill:  0     *If you need refills on other medications prior to your next appointment, please contact your pharmacy*  Follow-Up: Call back or seek an in-person evaluation if the symptoms worsen or if the condition fails to improve as anticipated.  [facility] [phone]  Other Instructions If you were given a prescription, please take the prescription as you were instructed and follow the directions given on the discharge paperwork.   Over the next several days you should rest as much as possible, and drink more fluids than usual. Liquids will help thin and loosen mucus so you can cough it up. Liquids will also help prevent dehydration. Using a cool mist humidifier or a vaporizer to increase air moisture in your home can also make it easier for you to breathe and help decrease your cough.  To help soothe a sore throat gargle with warm salt water.  Make salt water by dissolving  teaspoon salt in 1 cup warm water. You may also use throat lozenges and over the counter sore throat spray.  Please follow up with your primary care provider within 5-7 days for re-evaluation of your symptoms. If you do not have a primary care provider,  information for a healthcare clinic has been provided for you to make arrangements for follow up care. Please return to the emergency department for any persistent fevers, worsening sore throat/hoarse voice, inability to swallow, persistent vomiting, chest pain, shortness of breath, coughing up blood, or any new or worsening symptoms.    If you have been instructed to have an in-person evaluation today at a local Urgent Care facility, please use the link below. It will take you to a list of all of our available [facility], including address, phone number and hours of operation. Please do not delay care.  [facility]  If you or a family member do not have a  primary care provider, use the link below to schedule a visit and establish care. When you choose a [facility] primary care physician or advanced practice provider, you gain a long-term partner in health. Find a Primary Care Provider  Learn more about [facility]'s in-office and virtual care options: [facility] Get Care Now

## 2023-03-09 ENCOUNTER — Telehealth: Payer: Self-pay | Admitting: Nurse Practitioner

## 2023-03-09 DIAGNOSIS — B379 Candidiasis, unspecified: Secondary | ICD-10-CM

## 2023-03-09 DIAGNOSIS — T3695XA Adverse effect of unspecified systemic antibiotic, initial encounter: Secondary | ICD-10-CM

## 2023-03-09 MED ORDER — FLUCONAZOLE 150 MG PO TABS
ORAL_TABLET | ORAL | 0 refills | Status: DC
Start: 2023-03-09 — End: 2023-04-13

## 2023-03-09 NOTE — Progress Notes (Signed)
 E-Visit for Vaginal Symptoms  We are sorry that you are not feeling well. Here is how we plan to help! Based on what you shared with me it looks like you: May have a yeast vaginosis  Vaginosis is an inflammation of the vagina that can result in discharge, itching and pain. The cause is usually a change in the normal balance of vaginal bacteria or an infection. Vaginosis can also result from reduced estrogen levels after menopause.  The most common causes of vaginosis are:   Bacterial vaginosis which results from an overgrowth of one on several organisms that are normally present in your vagina.   Yeast infections which are caused by a naturally occurring fungus called candida.   Vaginal atrophy (atrophic vaginosis) which results from the thinning of the vagina from reduced estrogen levels after menopause.   Trichomoniasis which is caused by a parasite and is commonly transmitted by sexual intercourse.  Factors that increase your risk of developing vaginosis include: Medications, such as antibiotics and steroids Uncontrolled diabetes Use of hygiene products such as bubble bath, vaginal spray or vaginal deodorant Douching Wearing damp or tight-fitting clothing Using an intrauterine device (IUD) for birth control Hormonal changes, such as those associated with pregnancy, birth control pills or menopause Sexual activity Having a sexually transmitted infection  Your treatment plan is A single Diflucan  (fluconazole ) 150mg  tablet once, you ay repeat the dosage after 72 hours if needed.  I have electronically sent this prescription into the pharmacy that you have chosen.  Be sure to take all of the medication as directed. Stop taking any medication if you develop a rash, tongue swelling or shortness of breath. Mothers who are breast feeding should consider pumping and discarding their breast milk while on these antibiotics. However, there is no consensus that infant exposure at these doses  would be harmful.  Remember that medication creams can weaken latex condoms. SABRA   HOME CARE:  Good hygiene may prevent some types of vaginosis from recurring and may relieve some symptoms:  Avoid baths, hot tubs and whirlpool spas. Rinse soap from your outer genital area after a shower, and dry the area well to prevent irritation. Don't use scented or harsh soaps, such as those with deodorant or antibacterial action. Avoid irritants. These include scented tampons and pads. Wipe from front to back after using the toilet. Doing so avoids spreading fecal bacteria to your vagina.  Other things that may help prevent vaginosis include:  Don't douche. Your vagina doesn't require cleansing other than normal bathing. Repetitive douching disrupts the normal organisms that reside in the vagina and can actually increase your risk of vaginal infection. Douching won't clear up a vaginal infection. Use a latex condom. Both female and female latex condoms may help you avoid infections spread by sexual contact. Wear cotton underwear. Also wear pantyhose with a cotton crotch. If you feel comfortable without it, skip wearing underwear to bed. Yeast thrives in [place] [facility] Your symptoms should improve in the next day or two.  GET HELP RIGHT AWAY IF:  You have pain in your lower abdomen ( pelvic area or over your ovaries) You develop nausea or vomiting You develop a fever Your discharge changes or worsens You have persistent pain with intercourse You develop shortness of breath, a rapid pulse, or you faint.  These symptoms could be signs of problems or infections that need to be evaluated by a medical provider now.  MAKE SURE YOU   Understand these instructions. Will watch your  condition. Will get help right away if you are not doing well or get worse.  Thank you for choosing an e-visit.  Your e-visit answers were reviewed by a board certified advanced clinical practitioner to complete your  personal care plan. Depending upon the condition, your plan could have included both over the counter or prescription medications.  Please review your pharmacy choice. Make sure the pharmacy is open so you can pick up prescription now. If there is a problem, you may contact your provider through [employer] and have the prescription routed to another pharmacy.  Your safety is important to us . If you have drug allergies check your prescription carefully.   For the next 24 hours you can use MyChart to ask questions about today's visit, request a non-urgent call back, or ask for a work or school excuse. You will get an email in the next two days asking about your experience. I hope that your e-visit has been valuable and will speed your recovery.   Meds ordered this encounter  Medications   fluconazole  (DIFLUCAN ) 150 MG tablet    Sig: Take one tablet by mouth, repeat after 72 hours if needed x1    Dispense:  2 tablet    Refill:  0    I spent approximately 5 minutes reviewing the patient's history, current symptoms and coordinating their care today.

## 2023-04-13 ENCOUNTER — Other Ambulatory Visit (HOSPITAL_COMMUNITY): Payer: Self-pay

## 2023-04-13 ENCOUNTER — Telehealth: Payer: 59 | Admitting: Physician Assistant

## 2023-04-13 DIAGNOSIS — J029 Acute pharyngitis, unspecified: Secondary | ICD-10-CM

## 2023-04-13 DIAGNOSIS — Z20818 Contact with and (suspected) exposure to other bacterial communicable diseases: Secondary | ICD-10-CM | POA: Diagnosis not present

## 2023-04-13 MED ORDER — FLUCONAZOLE 150 MG PO TABS
ORAL_TABLET | ORAL | 0 refills | Status: DC
  Filled 2023-04-13: qty 2, 3d supply, fill #0

## 2023-04-13 MED ORDER — AZITHROMYCIN 250 MG PO TABS
ORAL_TABLET | ORAL | 0 refills | Status: AC
Start: 2023-04-13 — End: 2023-04-18
  Filled 2023-04-13: qty 6, 5d supply, fill #0

## 2023-04-13 NOTE — Progress Notes (Signed)
 E-Visit for Sore Throat - Strep Symptoms  We are sorry that you are not feeling well.  Here is how we plan to help!  Based on what you have shared with me it is likely that you have strep pharyngitis.  Strep pharyngitis is inflammation and infection in the back of the throat.  This is an infection cause by bacteria and is treated with antibiotics.  I have prescribed Azithromycin 250 mg two tablets today and then one daily for 4 additional days. For throat pain, we recommend over the counter oral pain relief medications such as acetaminophen or aspirin, or anti-inflammatory medications such as ibuprofen or naproxen sodium. Topical treatments such as oral throat lozenges or sprays may be used as needed. Strep infections are not as easily transmitted as other respiratory infections, however we still recommend that you avoid close contact with loved ones, especially the very young and elderly.  Remember to wash your hands thoroughly throughout the day as this is the number one way to prevent the spread of infection and wipe down door knobs and counters with disinfectant.   Home Care: Only take medications as instructed by your medical team. Complete the entire course of an antibiotic. Do not take these medications with alcohol. A steam or ultrasonic humidifier can help congestion.  You can place a towel over your head and breathe in the steam from hot water coming from a faucet. Avoid close contacts especially the very young and the elderly. Cover your mouth when you cough or sneeze. Always remember to wash your hands.  Get Help Right Away If: You develop worsening fever or sinus pain. You develop a severe head ache or visual changes. Your symptoms persist after you have completed your treatment plan.  Make sure you Understand these instructions. Will watch your condition. Will get help right away if you are not doing well or get worse.   Thank you for choosing an e-visit.  Your e-visit  answers were reviewed by a board certified advanced clinical practitioner to complete your personal care plan. Depending upon the condition, your plan could have included both over the counter or prescription medications.  Please review your pharmacy choice. Make sure the pharmacy is open so you can pick up prescription now. If there is a problem, you may contact your provider through [employer] and have the prescription routed to another pharmacy.  Your safety is important to [place]. If you have drug allergies check your prescription carefully.   For the next 24 hours you can use MyChart to ask questions about today's visit, request a non-urgent call back, or ask for a work or school excuse. You will get an email in the next two days asking about your experience. I hope that your e-visit has been valuable and will speed your recovery.

## 2023-04-13 NOTE — Progress Notes (Signed)
 Message sent to patient requesting further input regarding current symptoms. Awaiting patient response.

## 2023-04-13 NOTE — Progress Notes (Signed)
 I have spent 5 minutes in review of e-visit questionnaire, review and updating patient chart, medical decision making and response to patient.   Piedad Climes, PA-C

## 2023-04-13 NOTE — Addendum Note (Signed)
Addended by: Waldon Merl on: 04/13/2023 08:46 AM   Modules accepted: Orders

## 2023-04-14 ENCOUNTER — Encounter: Payer: Self-pay | Admitting: Family Medicine

## 2023-04-14 DIAGNOSIS — J0301 Acute recurrent streptococcal tonsillitis: Secondary | ICD-10-CM

## 2023-05-11 ENCOUNTER — Telehealth

## 2023-05-11 DIAGNOSIS — T3695XA Adverse effect of unspecified systemic antibiotic, initial encounter: Secondary | ICD-10-CM | POA: Diagnosis not present

## 2023-05-11 DIAGNOSIS — B379 Candidiasis, unspecified: Secondary | ICD-10-CM | POA: Diagnosis not present

## 2023-05-12 ENCOUNTER — Other Ambulatory Visit (HOSPITAL_COMMUNITY): Payer: Self-pay

## 2023-05-12 MED ORDER — FLUCONAZOLE 150 MG PO TABS
ORAL_TABLET | ORAL | 0 refills | Status: DC
  Filled 2023-05-12: qty 2, 3d supply, fill #0

## 2023-05-12 NOTE — Progress Notes (Signed)

## 2023-05-18 NOTE — Progress Notes (Signed)
 I have spent 5 minutes in review of e-visit questionnaire, review and updating patient chart, medical decision making and response to patient.   Piedad Climes, PA-C

## 2023-05-25 ENCOUNTER — Encounter: Payer: Self-pay | Admitting: Obstetrics

## 2023-05-25 DIAGNOSIS — Z803 Family history of malignant neoplasm of breast: Secondary | ICD-10-CM

## 2023-05-26 ENCOUNTER — Other Ambulatory Visit: Payer: Self-pay | Admitting: Obstetrics

## 2023-05-26 DIAGNOSIS — Z803 Family history of malignant neoplasm of breast: Secondary | ICD-10-CM

## 2023-06-09 ENCOUNTER — Other Ambulatory Visit: Payer: Self-pay | Admitting: Family Medicine

## 2023-06-09 MED ORDER — SERTRALINE HCL 100 MG PO TABS: 100.0000 mg | ORAL_TABLET | Freq: Every day | ORAL | 3 refills | Status: AC

## 2023-06-09 NOTE — Telephone Encounter (Signed)
 Copied from CRM [id number]. Topic: Clinical - Medication Refill >> Jun 09, 2023  9:41 AM Bambi Bonine D wrote: Most Recent Primary Care Visit:  Provider: Luevenia Saha  Department: [facility]  Visit Type: PHYSICAL  Date: 11/17/2022  Medication: sertraline (ZOLOFT) 100 MG tablet  Has the patient contacted their pharmacy? Yes (Agent: If no, request that the patient contact the pharmacy for the refill. If patient does not wish to contact the pharmacy document the reason why and proceed with request.) (Agent: If yes, when and what did the pharmacy advise?)  Is this the correct pharmacy for this prescription? Yes If no, delete pharmacy and type the correct one.  This is the patient's preferred pharmacy:  [id number] - [place], [place] - [address] AT [facility] OF [address] [place] [address] Phone: [phone] Fax: [phone]   Has the prescription been filled recently? No  Is the patient out of the medication? Yes  Has the patient been seen for an appointment in the last year OR does the patient have an upcoming appointment? Yes  Can we respond through MyChart? Yes  Agent: Please be advised that Rx refills may take up to 3 business days. We ask that you follow-up with your pharmacy.

## 2023-06-10 ENCOUNTER — Ambulatory Visit (INDEPENDENT_AMBULATORY_CARE_PROVIDER_SITE_OTHER): Payer: 59 | Admitting: Otolaryngology

## 2023-06-10 ENCOUNTER — Encounter (INDEPENDENT_AMBULATORY_CARE_PROVIDER_SITE_OTHER): Payer: Self-pay | Admitting: Otolaryngology

## 2023-06-10 VITALS — BP 120/88 | HR 96 | Ht 65.0 in | Wt 231.0 lb

## 2023-06-10 DIAGNOSIS — R0981 Nasal congestion: Secondary | ICD-10-CM

## 2023-06-10 DIAGNOSIS — J312 Chronic pharyngitis: Secondary | ICD-10-CM | POA: Diagnosis not present

## 2023-06-10 DIAGNOSIS — R0982 Postnasal drip: Secondary | ICD-10-CM | POA: Diagnosis not present

## 2023-06-10 DIAGNOSIS — J3089 Other allergic rhinitis: Secondary | ICD-10-CM | POA: Diagnosis not present

## 2023-06-10 MED ORDER — FLUTICASONE PROPIONATE 50 MCG/ACT NA SUSP: 2.0000 | Freq: Every day | NASAL | 6 refills | Status: DC

## 2023-06-10 MED ORDER — CETIRIZINE HCL 10 MG PO TABS: 10.0000 mg | ORAL_TABLET | Freq: Every day | ORAL | 11 refills | Status: DC

## 2023-06-10 NOTE — Progress Notes (Signed)
 ENT CONSULT:  Reason for Consult: recurrent throat infections   HPI: Discussed the use of AI scribe software for clinical note transcription with the patient, who gave verbal consent to proceed.  History of Present Illness Sabrina White is a 38 year old female who presents for evaluation of chronic sore throat, recurrent tonsillitis.  She experiences recurrent strep throat infections, with at least four documented episodes over the past two and a half years. The most severe episode occurred in February, causing significant pain and difficulty swallowing. Antibiotic treatment, typically a Z-Pak, provides relief after several days of severe symptoms. Her child has also tested positive for strep, suggesting a possible source of infection. She uses lidocaine syrup for symptomatic relief, although it provides only short-term relief.  She has a history of GERD, which was severe prior to undergoing gastric bypass surgery. Since the surgery, she reports no GERD symptoms. She also underwent hernia repair and removal of stomach polyps during the same procedure.  No daily postnasal drainage and she does not consider herself a 'sniffly person.' However, during allergy season, she experiences occasional throat discomfort, raising concerns about potential strep infections.   Records Reviewed:  E-visit 04/13/23 Based on what you have shared with me it is likely that you have strep pharyngitis.  Strep pharyngitis is inflammation and infection in the back of the throat.  This is an infection cause by bacteria and is treated with antibiotics.  I have prescribed Azithromycin 250 mg two tablets today and then one daily for 4 additional days. For throat pain, we recommend over the counter oral pain relief medications such as acetaminophen or aspirin, or anti-inflammatory medications such as ibuprofen or naproxen sodium. Topical treatments such as oral throat lozenges or sprays may be used as needed. Strep  infections are not as easily transmitted as other respiratory infections, however we still recommend that you avoid close contact with loved ones, especially the very young and elderly.  Remember to wash your hands thoroughly throughout the day as this is the number one way to prevent the spread of infection and wipe down door knobs and counters with disinfectant.     Past Medical History:  Diagnosis Date   Anxiety    Family history of adverse reaction to anesthesia    Mother has PONV   Family history of breast cancer in mother 04/05/2017   Mother and maternal grandmother, not genetically tested. Paternal aunt, negative BRCA gene tested.   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/2020 04/30/2021   GERD (gastroesophageal reflux disease)    History of hiatal hernia    Hypertension    Hypothyroidism    resolved after pregnancy   PCOS (polycystic ovarian syndrome)     Past Surgical History:  Procedure Laterality Date   CESAREAN SECTION N/A 05/06/2019   Procedure: CESAREAN SECTION;  Surgeon: Noland Fordyce, MD;  Location: [facility];  Service: Obstetrics;  Laterality: N/A;   ESOPHAGOGASTRODUODENOSCOPY ENDOSCOPY  11/19/2020   GASTRIC ROUX-EN-Y N/A 01/13/2021   Procedure: LAPAROSCOPIC ROUX-EN-Y GASTRIC BYPASS WITH UPPER ENDOSCOPY;  Surgeon: Berna Bue, MD;  Location: [facility] ORS;  Service: General;  Laterality: N/A;   HIATAL HERNIA REPAIR N/A 01/13/2021   Procedure: HERNIA REPAIR HIATAL;  Surgeon: Berna Bue, MD;  Location: [facility] ORS;  Service: General;  Laterality: N/A;    Family History  Problem Relation Age of Onset   Breast cancer Mother    Hypertension Father    Breast cancer Paternal Aunt    Breast cancer Maternal Grandmother  Colon cancer Neg Hx    Pancreatic cancer Neg Hx    Esophageal cancer Neg Hx    Liver cancer Neg Hx    Stomach cancer Neg Hx    Colon polyps Neg Hx     Social History:  reports that she has never smoked. She has never used smokeless tobacco. She  reports current alcohol use. She reports that she does not use drugs.  Allergies:  Allergies  Allergen Reactions   Codeine Other (See Comments)    Other reaction(s): Dizziness   Nifedipine Nausea And Vomiting    Caused severe headache.   Raspberry Other (See Comments)    Causes blisters on lips    Medications: I have reviewed the patient's current medications.  The PMH, PSH, Medications, Allergies, and SH were reviewed and updated.  ROS: Constitutional: Negative for fever, weight loss and weight gain. Cardiovascular: Negative for chest pain and dyspnea on exertion. Respiratory: Is not experiencing shortness of breath at rest. Gastrointestinal: Negative for nausea and vomiting. Neurological: Negative for headaches. Psychiatric: The patient is not nervous/anxious  Blood pressure 120/88, pulse 96, height 5\' 5"  (1.651 m), weight 231 lb (104.8 kg), SpO2 99%, unknown if currently breastfeeding. Body mass index is 38.44 kg/m.  PHYSICAL EXAM:  Exam: General: Well-developed, well-nourished Respiratory Respiratory effort: Equal inspiration and expiration without stridor Cardiovascular Peripheral Vascular: Warm extremities with equal color/perfusion Eyes: No nystagmus with equal extraocular motion bilaterally Neuro/Psych/Balance: Patient oriented to person, place, and time; Appropriate mood and affect; Gait is intact with no imbalance; Cranial nerves I-XII are intact Head and Face Inspection: Normocephalic and atraumatic without mass or lesion Palpation: Facial skeleton intact without bony stepoffs Salivary Glands: No mass or tenderness Facial Strength: Facial motility symmetric and full bilaterally ENT Pinna: External ear intact and fully developed External canal: Canal is patent with intact skin Tympanic Membrane: Clear and mobile External Nose: No scar or anatomic deformity Internal Nose: Septum is relatively straight. No polyp, or purulence. Mucosal edema and erythema  present.  Lips, Teeth, and gums: Mucosa and teeth intact and viable TMJ: No pain to palpation with full mobility Oral cavity/oropharynx: No erythema or exudate, no lesions present 2+ tonsils tonsil stones bilaterally Neck Neck and Trachea: Midline trachea without mass or lesion Thyroid: No mass or nodularity Lymphatics: No lymphadenopathy   Assessment/Plan: Encounter Diagnoses  Name Primary?   Chronic sore throat Yes   Environmental and seasonal allergies    Post-nasal drip    Nasal congestion     Assessment and Plan Assessment & Plan Recurrent Tonsillitis Four episodes in 2.5 years, severe pain during episodes. Sx respond to abx. Exam with 2+ tonsils tonsil stones, no exudate. Consider tonsillectomy if conservative measures fail. - Advised daily cetirizine, fluticasone twice a day, and nasal rinses with a neti pot. - Recommend daily gargling with salt water. - Consider tonsillectomy if recurrent episodes persist. - Advise obtaining a strep test during future episodes. - Use amoxicillin-clavulanate if symptoms persist beyond azithromycin.  Tonsil Stones Tonsil stones present, not causing significant symptoms. - Advise gargling with salt water. - Consider using a water pick for removal.      Thank you for allowing me to participate in the care of this patient. Please do not hesitate to contact me with any questions or concerns.   Ashok Croon, MD Otolaryngology [facility] Phone: [phone] Fax: [phone]    06/10/2023, 8:14 PM

## 2023-06-12 IMAGING — RF DG UGI W SINGLE CM
12 of 16 series · 12 of 24 positions shown · non-contrast
Comparison: None.

CLINICAL DATA: Preoperative for bariatric surgery, morbid obesity.

EXAM:
UPPER GI SERIES WITH KUB
TECHNIQUE: After obtaining a scout radiograph a routine upper GI series was
performed using thin barium
FLUOROSCOPY TIME:  Fluoroscopy Time:  2 minutes, 42 seconds
Radiation Exposure Index (if provided by the fluoroscopic device):
59.2 mGy
Number of Acquired Spot Images: For

[Series 2: cp_standard · 0.34mm/px · 1 of 11 frames shown (1 of 11)]
[frame 6/11]
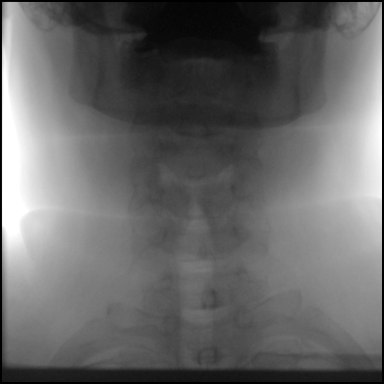

[Series 3: cp_standard · 0.34mm/px · 1 of 40 frames shown (2 of 11)]
[frame 35/40]
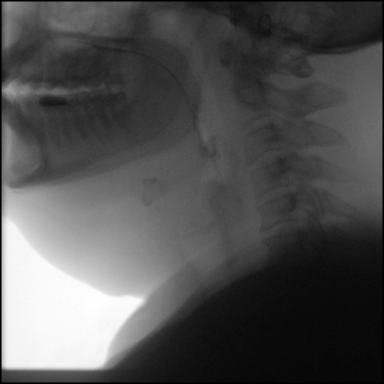

[Series 4: cp_standard · 0.34mm/px · 1 of 71 frames shown (3 of 11)]
[frame 61/71]
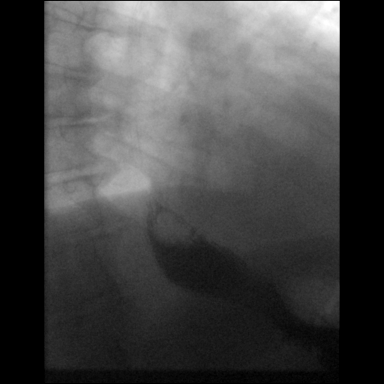

[Series 5: cp_standard · 0.34mm/px · 1 of 32 frames shown (4 of 11)]
[frame 28/32]
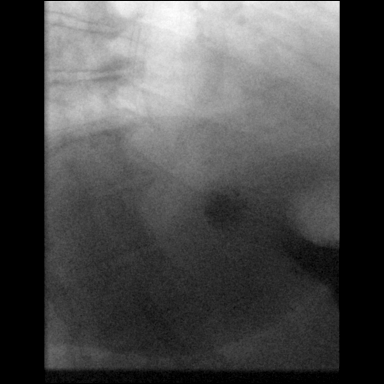

[Series 7: fluoro_barium singleshot_bw · 0.17mm/px · 1 of 1 slices shown]
[im 1/1]
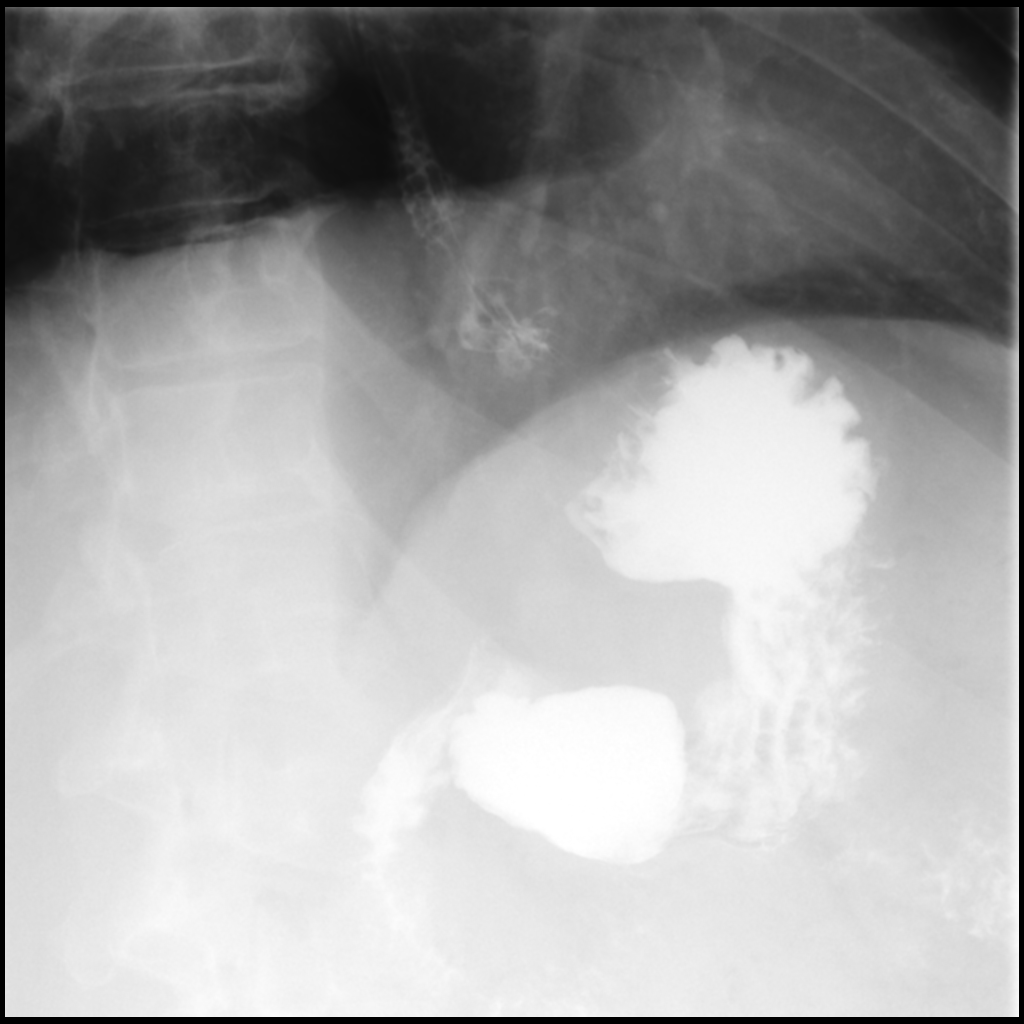

[Series 11: cp_standard · 0.35mm/px · 1 of 40 frames shown (5 of 11)]
[frame 21/40]
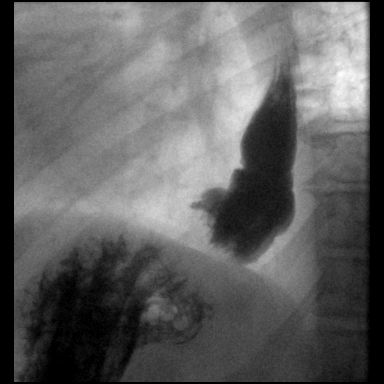

[Series 12: cp_standard · 0.35mm/px · 1 of 42 frames shown (6 of 11)]
[frame 41/42]
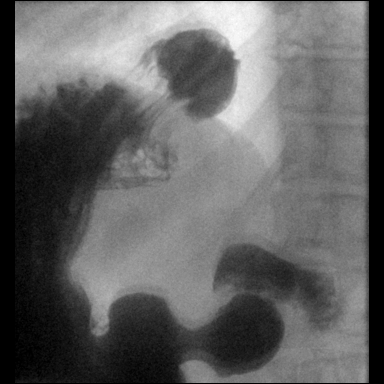

[Series 13: cp_standard · 0.36mm/px · 1 of 40 frames shown (7 of 11)]
[frame 38/40]
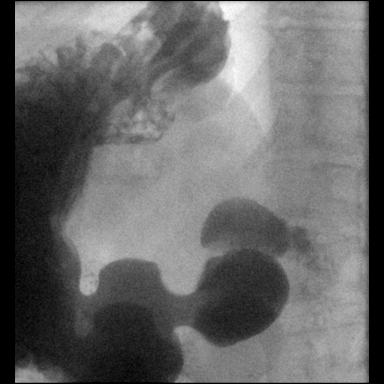

[Series 15: cp_standard · 0.36mm/px · 1 of 23 frames shown (8 of 11)]
[frame 4/23]
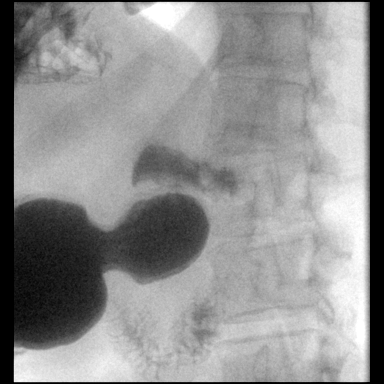

[Series 16: cp_standard · 0.36mm/px · 1 of 21 frames shown (9 of 11)]
[frame 11/21]
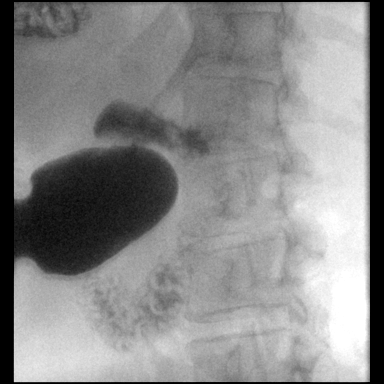

[Series 17: cp_standard · 0.53mm/px · 1 of 14 frames shown (10 of 11)]
[frame 8/14]
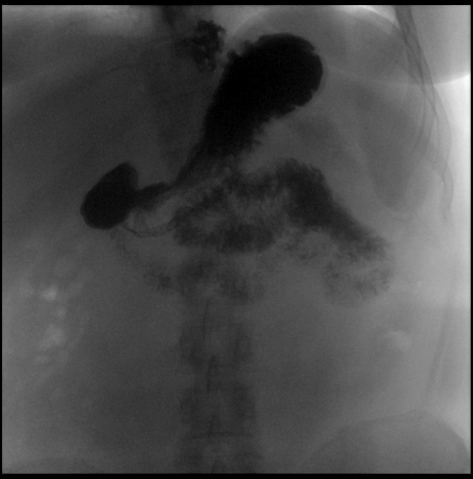

[Series 18: cp_standard · 0.36mm/px · 1 of 13 frames shown (11 of 11)]
[frame 12/13]
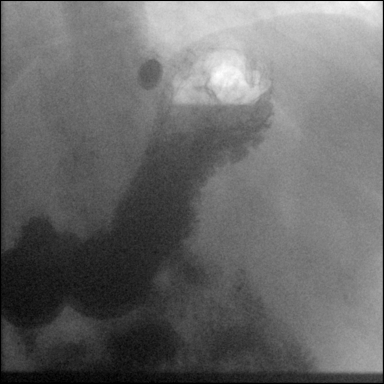

[12 of 24 positions shown; findings below may reference images not displayed]

FINDINGS: The initial KUB demonstrates dextroconvex thoracic and mild
levoconvex lumbar scoliosis. Unremarkable bowel gas pattern in the
upper abdomen.

The pharyngeal phase of swallowing appears normal. Primary
peristaltic waves in the esophagus are normal on [DATE] swallows.

Distal esophageal mucosal ring 2.1 cm in diameter. Schatzki rings of
this diameter rarely cause symptoms.

Small to moderate-sized type 1 hiatal hernia as shown for example on
image 31 series 12.

No esophageal stricture or ulceration identified.

Aside from the hiatal hernia, gastric morphology is normal. Mucosal
relief compression fluoroscopy was performed. The duodenal bulb in
the remainder of the duodenum demonstrate normal appearance and
configuration as shown for example on image 10 series 17.

A 13 mm barium tablet passed briskly down into the region of the
hiatal hernia.
IMPRESSION: 1. Small to moderate-sized type 1 hiatal hernia.
2. Widely patent (approximately 2.1 cm in diameter) distal
esophageal mucosal ring. Unlikely to be causing symptoms given this
diameter.
3. There is a moderate degree of dextroconvex thoracic scoliosis and
mild levoconvex lumbar scoliosis.

## 2023-06-21 ENCOUNTER — Ambulatory Visit (INDEPENDENT_AMBULATORY_CARE_PROVIDER_SITE_OTHER): Admitting: Family Medicine

## 2023-06-21 ENCOUNTER — Encounter: Payer: Self-pay | Admitting: Family Medicine

## 2023-06-21 VITALS — BP 118/88 | HR 89 | Temp 97.7°F | Ht 65.0 in | Wt 232.6 lb

## 2023-06-21 DIAGNOSIS — F411 Generalized anxiety disorder: Secondary | ICD-10-CM | POA: Diagnosis not present

## 2023-06-21 DIAGNOSIS — Z9884 Bariatric surgery status: Secondary | ICD-10-CM | POA: Diagnosis not present

## 2023-06-21 DIAGNOSIS — E282 Polycystic ovarian syndrome: Secondary | ICD-10-CM

## 2023-06-21 MED ORDER — ALPRAZOLAM 0.5 MG PO TABS: 0.5000 mg | ORAL_TABLET | Freq: Every day | ORAL | 0 refills | Status: DC | PRN

## 2023-06-21 MED ORDER — WEGOVY 0.25 MG/0.5ML ~~LOC~~ SOAJ
0.2500 mg | SUBCUTANEOUS | 0 refills | Status: AC
Start: 2023-06-21 — End: ?

## 2023-06-21 NOTE — Patient Instructions (Addendum)
 Please return in September for your complete physical.  If you have any questions or concerns, please don't hesitate to send me a message via MyChart or call the office at [phone]. Thank you for visiting with us  today! It's our pleasure caring for you.    VISIT SUMMARY: Today, we discussed your chronic back pain, weight management, and anxiety. We reviewed your history of severe scoliosis and the impact it has on your daily life. We also talked about your weight gain following gastric bypass surgery and your current physical activities. Additionally, we addressed your anxiety and the medications you are using to manage it.  YOUR PLAN: -OBESITY: Obesity is a condition where excess body fat has accumulated to the extent that it may have a negative effect on health. We discussed starting you on a medication called Wegovy, which is a GLP-1 agonist, to help with weight management. You should take 0.25 mg weekly and we will adjust the dose based on your tolerance and weight loss. Remember to stay hydrated and eat small portion sizes. Keep up with your physical activities.  -POLYCYSTIC OVARY SYNDROME (PCOS): PCOS is a hormonal disorder common among women of reproductive age, which can complicate weight management. The GLP-1 agonist therapy we discussed for your weight management may also help with the metabolic symptoms of PCOS.  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 Since the previous SI joint injection did not help, we will proceed with an MRI to get a better understanding of your back pain. Continue working with your current specialists for pain management.  -ANXIETY DISORDER: Anxiety disorder is a mental health condition characterized by excessive worry or fear. You are currently managing it with sertraline , and you have  Xanax  for occasional use during heightened anxiety episodes. Continue taking sertraline  100 mg daily and use Xanax  as needed.  INSTRUCTIONS: We will order an MRI to evaluate your back pain further. Please continue to follow up with your current specialists for pain management. If you have any new symptoms or concerns, schedule an appointment.                      Contains text generated by Abridge.                                 Contains text generated by Abridge.

## 2023-06-21 NOTE — Progress Notes (Signed)
 Subjective  CC:  Chief Complaint  Patient presents with   Back Pain    MRI is scheduled for Monday and want to F/U with PCP on Anxiety meds and wt   Anxiety   Weight Loss    HPI: Sabrina White is a 38 y.o. female who presents to the office today to address the problems listed above in the chief complaint. Discussed the use of AI scribe software for clinical note transcription with the patient, who gave verbal consent to proceed.  History of Present Illness Sabrina White is a 38 year old female with severe scoliosis who presents for weight management and anxiety.  She has been experiencing chronic back pain for about three months, described as constant and severe, with spasms that feel like 'a lightning strike' in her back. This pain significantly impacts her daily life. She has a history of severe scoliosis and has been evaluated by specialists, including Dr. Vaughn Georges at [facility] and Dr. Lyanne Sample for pain management. Previous treatments, including an injection directly into the SI joint, have not provided relief.  She is dealing with weight management issues following gastric bypass surgery two years ago, which initially resulted in significant weight loss. However, she has noticed a gradual weight gain and is currently back in the 230s. Despite being more active than ever, engaging in activities like Pilates, low-impact spinning, and walking, she feels that her back pain and possibly elevated cortisol levels are contributing to her weight gain. She maintains a healthy diet, cooking most meals at home, and takes bariatric supplements regularly.  She is also managing anxiety, for which she takes sertraline . The current dose helps maintain a low baseline of anxiety, but she still experiences episodes of heightened anxiety when faced with stressful situations. She uses Xanax  as a rescue medication for these episodes, although she uses it sparingly, less than twice a month. She is  concerned about the impact of her anxiety and medication on her weight management efforts.   Assessment  1. GAD (generalized anxiety disorder)   2. Morbid obesity (HCC)   3. 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 PCOS (polycystic ovarian syndrome)      Plan  Assessment and Plan Assessment & Plan Obesity Obesity with history of gastric bypass. Interested in GLP-1 agonists for weight management. Discussed benefits and side effects. GLP-1 agonists deemed safe and effective, likely covered by insurance. - Order Wegovy 0.25 mg weekly, titrate based on tolerance and weight loss. - Advise on hydration and small portion sizes. - Encourage continued physical activity.  Polycystic Ovary Syndrome (PCOS) PCOS complicates weight management. GLP-1 agonists may benefit weight and metabolic symptoms. - Consider GLP-1 agonist therapy for PCOS.  Chronic back pain with scoliosis and disc degeneration Chronic back pain with scoliosis and disc degeneration. Previous SI joint injection ineffective. Severe pain with spasms. - Proceed with MRI to evaluate back pain. - Continue management with current specialists.  Anxiety disorder Anxiety disorder managed with sertraline . Occasional exacerbations managed with Xanax . Discussed SSRIs' role in weight management. - Continue sertraline  100 mg daily. - Refill Xanax  for as-needed use.    No orders of the defined types were placed in this encounter.  Meds ordered this encounter  Medications   ALPRAZolam  (XANAX ) 0.5 MG tablet    Sig: Take 1 tablet (0.5 mg total) by mouth daily as needed for anxiety.    Dispense:  30 tablet    Refill:  0   WEGOVY 0.25 MG/0.5ML SOAJ  Sig: Inject 0.25 mg into the skin once a week.    Dispense:  2 mL    Refill:  0     I reviewed the patients updated PMH, FH, and SocHx.    Patient Active Problem List   Diagnosis Date Noted   History of hypertension 11/17/2022    Priority: High   GAD (generalized anxiety  disorder) 05/14/2021    Priority: High   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/2020 04/30/2021    Priority: High   Panic attacks 10/27/2019    Priority: High   Family history of breast cancer in mother 04/05/2017    Priority: High   PCOS (polycystic ovarian syndrome) 04/01/2015    Priority: High   Migraine 08/19/2022    Priority: Medium    Hiatal hernia 02/12/2021    Priority: Medium    History of classical cesarean section 07/27/2019    Priority: Medium    History of maternal deep vein thrombosis (DVT) 05/10/2019    Priority: Medium    Seasonal allergic rhinitis due to pollen 11/17/2022    Priority: Low   Rhus dermatitis 11/17/2022    Priority: Low   Vitamin D  deficiency 10/27/2019    Priority: Low   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   fluticasone  (FLONASE ) 50 MCG/ACT nasal spray Place 2 sprays into both nostrils daily.   methocarbamol  (ROBAXIN ) 750 MG tablet Take 750 mg by mouth 2 (two) times daily.   sertraline  (ZOLOFT ) 100 MG tablet Take 1 tablet (100 mg total) by mouth daily.   [DISCONTINUED] ALPRAZolam  (XANAX ) 0.5 MG tablet Take 1 tablet (0.5 mg total) by mouth daily as needed for anxiety.   WEGOVY 0.25 MG/0.5ML SOAJ Inject 0.25 mg into the skin once a week.    Allergies: Patient is allergic to codeine, nifedipine , and raspberry. Family History: Patient family history includes Breast cancer in her maternal grandmother, mother, and paternal aunt; Hypertension in her father. Social History:  Patient  reports that she has never smoked. She has never used smokeless tobacco. She reports current alcohol use. She reports that she does not use drugs.  Review of Systems: Constitutional: Negative for fever malaise or anorexia Cardiovascular: negative for chest pain Respiratory:  negative for SOB or persistent cough Gastrointestinal: negative for abdominal pain  Objective  Vitals: BP 118/88   Pulse 89   Temp 97.7 F (36.5 C)   Ht 5\' 5"  (1.651 m)   Wt 232 lb 9.6 oz (105.5 kg)   SpO2 95%   BMI 38.71 kg/m  General: no acute distress , A&Ox3   Commons side effects, risks, benefits, and alternatives for medications and treatment plan prescribed today were discussed, and the patient expressed understanding of the given instructions. Patient is instructed to call or message via MyChart if he/she has any questions or concerns regarding our treatment plan. No barriers to understanding were identified. We discussed Red Flag symptoms and signs in detail. Patient expressed understanding regarding what to do in case of urgent or emergency type symptoms.  Medication list was reconciled, printed and provided to the patient in AVS. Patient instructions and summary information was reviewed with the patient as documented in the AVS. This note was prepared with assistance of Dragon voice recognition software. Occasional wrong-word or sound-a-like substitutions may  have occurred due to the inherent limitations of voice recognition software

## 2023-07-21 ENCOUNTER — Ambulatory Visit: Payer: Self-pay

## 2023-07-21 NOTE — Telephone Encounter (Signed)
 Appt tomorrow.

## 2023-07-21 NOTE — Telephone Encounter (Signed)
 Chief Complaint: swollen lymph node Symptoms: pain and swelling Frequency:  x 1 day Pertinent Negatives: Patient denies sob Disposition: [] ED /[] Urgent Care (no appt availability in office) / [x] Appointment(In office/virtual)/ []  [facility]/ [] Home Care/ [] Refused Recommended Disposition /[] [facility]/ []  Follow-up with PCP Additional Notes: pt states that pain is under her chin. States she thinks its her lymph nodes. States its the left side under her chin. States that she noticed it yesterday and it worse today and size of a marble. Denies any cold symptoms or sore throat. States pain 3/10 when touching or turning head.   Copied from CRM [id number]. Topic: Clinical - Red Word Triage >> Jul 21, 2023 11:58 AM Bambi Bonine D wrote: Red Word that prompted transfer to Nurse Triage: pain, sharp pain  Pt stated that she is experiencing pain under her chin. Pt stated that when she turns her head she feels a sharp pain. Pt would like to schedule an appt with PCP. Reason for Disposition  [1] Very tender to the touch AND [2] no fever  Protocols used: Lymph Nodes - Swollen-A-AH

## 2023-07-22 ENCOUNTER — Ambulatory Visit (INDEPENDENT_AMBULATORY_CARE_PROVIDER_SITE_OTHER): Admitting: Family Medicine

## 2023-07-22 ENCOUNTER — Encounter: Payer: Self-pay | Admitting: Family Medicine

## 2023-07-22 ENCOUNTER — Other Ambulatory Visit (HOSPITAL_COMMUNITY): Payer: Self-pay

## 2023-07-22 VITALS — BP 119/82 | HR 82 | Temp 97.9°F | Ht 65.0 in | Wt 251.8 lb

## 2023-07-22 DIAGNOSIS — J0301 Acute recurrent streptococcal tonsillitis: Secondary | ICD-10-CM | POA: Diagnosis not present

## 2023-07-22 DIAGNOSIS — Z9884 Bariatric surgery status: Secondary | ICD-10-CM | POA: Diagnosis not present

## 2023-07-22 DIAGNOSIS — R590 Localized enlarged lymph nodes: Secondary | ICD-10-CM | POA: Diagnosis not present

## 2023-07-22 MED ORDER — AMOXICILLIN 875 MG PO TABS
875.0000 mg | ORAL_TABLET | Freq: Two times a day (BID) | ORAL | 0 refills | Status: AC
  Filled 2023-07-22: qty 14, 7d supply, fill #0

## 2023-07-22 MED ORDER — FLUCONAZOLE 150 MG PO TABS
ORAL_TABLET | ORAL | 0 refills | Status: DC
  Filled 2023-07-22: qty 2, 3d supply, fill #0

## 2023-07-22 NOTE — Progress Notes (Signed)
 Subjective  CC:  Chief Complaint  Patient presents with   Adenopathy    Swollen lymph nodes on the lt side of the neck. Noticed 3 days ago. Pt stated that she has had them before     HPI: Sabrina White is a 38 y.o. female who presents to the office today to address the problems listed above in the chief complaint. Discussed the use of AI scribe software for clinical note transcription with the patient, who gave verbal consent to proceed.  History of Present Illness Sabrina White is a 38 year old female with recurrent strep throat and tonsil stones who presents with neck soreness and jaw pain.  She has been experiencing neck soreness and a sensation of something growing since January 3rd. Initially, she sought care at [facility], where a strep test was negative, and she was treated with antibiotics for a suspected infection. She has a history of recurrent strep throat and tonsil stones, confirmed by an ENT, who mentioned the option of a tonsillectomy but warned about the difficult recovery for adults.  The current episode began two nights ago with soreness in the neck, progressing to pain when turning her neck and opening her jaw. Her throat is not sore, but she feels something under her neck. Her last severe sore throat occurred at the end of February. She is vigilant about monitoring her throat due to her history of recurrent infections.  She has not experienced halitosis or visible tonsil stones unless she has a full-blown strep infection.  She is also using semaglutide  from an online pharmacy for weight management, which has reduced her food noise and occasionally caused nausea after eating. She has completed two doses and is about to take the third. No adverse effects yet.   Wt Readings from Last 3 Encounters:  07/22/23 251 lb 12.8 oz (114.2 kg)  06/21/23 232 lb 9.6 oz (105.5 kg)  06/10/23 231 lb (104.8 kg)      Assessment  1. Reactive cervical lymphadenopathy   2.  Recurrent streptococcal tonsillitis   3. 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 Morbid obesity (HCC)      Plan  Assessment and Plan Assessment & Plan Recurrent sore throat with tonsil stones with reactive cervical lymphadenopathy Recurrent sore throat with tonsil stones, recent neck soreness, and throat redness suggest inflammation or infection. Reactive lymph node indicates infection. Malignancy unlikely. Tonsillectomy discussed but conservative management chosen. - Prescribed amoxicillin  for infection. - Advised ibuprofen  for pain and inflammation. - Monitor lymph node size and tenderness over 2-3 weeks. Return if LN persists. - Discussed tonsillectomy with ENT, opted for conservative management due to recovery concerns.  Obesity management with semaglutide  Utilizing semaglutide  for obesity management after insurance denial for Wegovy . Reports decreased appetite and occasional nausea. Treatment effectively reduces appetite. - Continue semaglutide  as prescribed. - Monitor for side effects such as nausea.    Follow up: 11/23/2023 For cpe No orders of the defined types were placed in this encounter.  Meds ordered this encounter  Medications   amoxicillin  (AMOXIL ) 875 MG tablet    Sig: Take 1 tablet (875 mg total) by mouth 2 (two) times daily for 7 days.    Dispense:  14 tablet    Refill:  0   fluconazole  (DIFLUCAN ) 150 MG tablet    Sig: Take one tablet today; may repeat in 3 days if symptoms persist    Dispense:  2 tablet    Refill:  0  I reviewed the patients updated PMH, FH, and SocHx.  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 04/30/2021    Priority: High   Panic attacks 10/27/2019    Priority: High   Family history of breast cancer in mother 04/05/2017    Priority: High   PCOS  (polycystic ovarian syndrome) 04/01/2015    Priority: High   Migraine 08/19/2022    Priority: Medium    Hiatal hernia 02/12/2021    Priority: Medium    History of classical cesarean section 07/27/2019    Priority: Medium    History of maternal deep vein thrombosis (DVT) 05/10/2019    Priority: Medium    Seasonal allergic rhinitis due to pollen 11/17/2022    Priority: Low   Rhus dermatitis 11/17/2022    Priority: Low   Vitamin D  deficiency 10/27/2019    Priority: Low   Eczema of external ear 07/14/2018    Priority: Low   Vitamin B12 deficiency 11/19/2022   Iron  deficiency 11/19/2022   Current Meds  Medication Sig   ALPRAZolam  (XANAX ) 0.5 MG tablet Take 1 tablet (0.5 mg total) by mouth daily as needed for anxiety.   amoxicillin  (AMOXIL ) 875 MG tablet Take 1 tablet (875 mg total) by mouth 2 (two) times daily for 7 days.   cetirizine  (ZYRTEC ) 10 MG tablet Take 1 tablet (10 mg total) by mouth daily.   fluconazole  (DIFLUCAN ) 150 MG tablet Take one tablet today; may repeat in 3 days if symptoms persist   methocarbamol  (ROBAXIN ) 750 MG tablet Take 750 mg by mouth 2 (two) times daily.   sertraline  (ZOLOFT ) 100 MG tablet Take 1 tablet (100 mg total) by mouth daily.   WEGOVY  0.25 MG/0.5ML SOAJ Inject 0.25 mg into the skin once a week.   Allergies: Patient is allergic to codeine, nifedipine , and raspberry. Family History: Patient family history includes Breast cancer in her maternal grandmother, mother, and paternal aunt; Hypertension in her father. Social History:  Patient  reports that she has never smoked. She has never used smokeless tobacco. She reports current alcohol use. She reports that she does not use drugs.  Review of Systems: Constitutional: Negative for fever malaise or anorexia Cardiovascular: negative for chest pain Respiratory: negative for SOB or persistent cough Gastrointestinal: negative for abdominal pain  Objective  Vitals: BP 119/82   Pulse 82   Temp 97.9 F  (36.6 C)   Ht 5\' 5"  (1.651 m)   Wt 251 lb 12.8 oz (114.2 kg)   SpO2 97%   BMI 41.90 kg/m  General: no acute distress , A&Ox3 HEENT: PEERL, conjunctiva normal, neck is supple, posterior pharynx is erythematous w/o exudate. Aprrox 1cm tender left cervical lymph node palpable and mobile. No posterior nodes. No supraclavicular nodes Cardiovascular:  RRR without murmur or gallop.  Respiratory:  Good breath sounds bilaterally, CTAB with normal respiratory effort Skin:  Warm, no rashes Commons side effects, risks, benefits, and alternatives for medications and treatment plan prescribed today were discussed, and the patient expressed understanding of the given instructions. Patient is instructed to call or message via MyChart if he/she has any questions or concerns regarding our treatment plan. No barriers to understanding were identified. We discussed Red Flag symptoms and signs in detail. Patient expressed understanding regarding what to do in case of urgent or emergency type  symptoms.  Medication list was reconciled, printed and provided to the patient in AVS. Patient instructions and summary information was reviewed with the patient as documented in the AVS. This note was prepared with assistance of Dragon voice recognition software. Occasional wrong-word or sound-a-like substitutions may have occurred due to the inherent limitations of voice recognition software

## 2023-08-20 ENCOUNTER — Encounter (HOSPITAL_COMMUNITY): Payer: Self-pay | Admitting: *Deleted

## 2023-11-23 ENCOUNTER — Ambulatory Visit: Admitting: Family Medicine

## 2023-11-23 ENCOUNTER — Encounter: Payer: Self-pay | Admitting: Family Medicine

## 2023-11-23 VITALS — BP 117/87 | HR 73 | Temp 98.1°F | Ht 65.0 in | Wt 223.6 lb

## 2023-11-23 DIAGNOSIS — E611 Iron deficiency: Secondary | ICD-10-CM | POA: Diagnosis not present

## 2023-11-23 DIAGNOSIS — F411 Generalized anxiety disorder: Secondary | ICD-10-CM | POA: Diagnosis not present

## 2023-11-23 DIAGNOSIS — Z0000 Encounter for general adult medical examination without abnormal findings: Secondary | ICD-10-CM

## 2023-11-23 DIAGNOSIS — Z23 Encounter for immunization: Secondary | ICD-10-CM

## 2023-11-23 DIAGNOSIS — Z9884 Bariatric surgery status: Secondary | ICD-10-CM

## 2023-11-23 DIAGNOSIS — E559 Vitamin D deficiency, unspecified: Secondary | ICD-10-CM | POA: Diagnosis not present

## 2023-11-23 DIAGNOSIS — E538 Deficiency of other specified B group vitamins: Secondary | ICD-10-CM | POA: Diagnosis not present

## 2023-11-23 DIAGNOSIS — E282 Polycystic ovarian syndrome: Secondary | ICD-10-CM

## 2023-11-23 DIAGNOSIS — Z0001 Encounter for general adult medical examination with abnormal findings: Secondary | ICD-10-CM

## 2023-11-23 LAB — VITAMIN D 25 HYDROXY (VIT D DEFICIENCY, FRACTURES): VITD: 37.17 ng/mL (ref 30.00–100.00)

## 2023-11-23 LAB — COMPREHENSIVE METABOLIC PANEL WITH GFR
ALT: 23 U/L (ref 0–35)
AST: 19 U/L (ref 0–37)
Albumin: 4.2 g/dL (ref 3.5–5.2)
Alkaline Phosphatase: 59 U/L (ref 39–117)
BUN: 12 mg/dL (ref 6–23)
CO2: 30 meq/L (ref 19–32)
Calcium: 9.4 mg/dL (ref 8.4–10.5)
Chloride: 102 meq/L (ref 96–112)
Creatinine, Ser: 0.8 mg/dL (ref 0.40–1.20)
GFR: 93.59 mL/min (ref >60.00)
Glucose, Bld: 77 mg/dL (ref 70–99)
Potassium: 4.6 meq/L (ref 3.5–5.1)
Sodium: 138 meq/L (ref 135–145)
Total Bilirubin: 0.4 mg/dL (ref 0.2–1.2)
Total Protein: 7.2 g/dL (ref 6.0–8.3)

## 2023-11-23 LAB — CBC WITH DIFFERENTIAL/PLATELET
Basophils Absolute: 0.1 K/uL (ref 0.0–0.1)
Basophils Relative: 0.8 % (ref 0.0–3.0)
Eosinophils Absolute: 0.2 K/uL (ref 0.0–0.7)
Eosinophils Relative: 3.4 % (ref 0.0–5.0)
HCT: 43.2 % (ref 36.0–46.0)
Hemoglobin: 14.2 g/dL (ref 12.0–15.0)
Lymphocytes Relative: 33.6 % (ref 12.0–46.0)
Lymphs Abs: 2.4 K/uL (ref 0.7–4.0)
MCHC: 32.9 g/dL (ref 30.0–36.0)
MCV: 85.5 fl (ref 78.0–100.0)
Monocytes Absolute: 0.4 K/uL (ref 0.1–1.0)
Monocytes Relative: 5.9 % (ref 3.0–12.0)
Neutro Abs: 4 K/uL (ref 1.4–7.7)
Neutrophils Relative %: 56.3 % (ref 43.0–77.0)
Platelets: 245 K/uL (ref 150.0–400.0)
RBC: 5.05 Mil/uL (ref 3.87–5.11)
RDW: 13.5 % (ref 11.5–15.5)
WBC: 7.2 K/uL (ref 4.0–10.5)

## 2023-11-23 LAB — LIPID PANEL
Cholesterol: 178 mg/dL (ref 0–200)
HDL: 35.5 mg/dL — ABNORMAL LOW (ref >39.00)
LDL Cholesterol: 103 mg/dL — ABNORMAL HIGH (ref 0–99)
NonHDL: 142.41
Total CHOL/HDL Ratio: 5
Triglycerides: 195 mg/dL — ABNORMAL HIGH (ref 0.0–149.0)
VLDL: 39 mg/dL (ref 0.0–40.0)

## 2023-11-23 LAB — VITAMIN B12: Vitamin B-12: 1043 pg/mL — ABNORMAL HIGH (ref 211–911)

## 2023-11-23 LAB — TSH: TSH: 2.02 u[IU]/mL (ref 0.35–5.50)

## 2023-11-23 LAB — HEMOGLOBIN A1C: Hgb A1c MFr Bld: 5.7 % (ref 4.6–6.5)

## 2023-11-23 NOTE — Patient Instructions (Addendum)
 Please return in 12 months for your annual complete physical; please come fasting.   I will release your lab results to you on your MyChart account with further instructions. You may see the results before I do, but when I review them I will send you a message with my report or have my assistant call you if things need to be discussed. Please reply to my message with any questions. Thank you!   If you have any questions or concerns, please don't hesitate to send me a message via MyChart or call the office at [phone]. Thank you for visiting with us  today! It's our pleasure caring for you.    VISIT SUMMARY: Today, you came in for your annual physical exam. We discussed your current treatments and overall health, including weight management, anxiety, and nutritional supplementation. You are making good progress with your weight and managing your anxiety well. Your adherence to your vitamin regimen is also commendable.  YOUR PLAN: -WEIGHT MANAGEMENT AND APPETITE SUPPRESSION: You are using semaglutide  to help manage your weight and suppress your appetite. This medication has been effective, and you have lost weight since your last visit. Continue using semaglutide  as it is working well for you.  -NUTRITIONAL SUPPLEMENTATION: You are taking a chewable vitamin supplement that includes iron , B12, and vitamin D . This is important to prevent deficiencies, especially after bariatric surgery. We will recheck your levels of iron , vitamin D , and B12 to ensure they are within the normal range.  -GENERALIZED ANXIETY DISORDER: You are managing your anxiety with sertraline  and occasionally using Xanax  for acute episodes. Continue using Xanax  as needed for acute anxiety.  INSTRUCTIONS: Please continue with your current medications and supplements. We will recheck your levels of iron , vitamin D , and B12 at your next visit. If you have any new symptoms or concerns, please schedule an  appointment.                      Contains text generated by Abridge.                                 Contains text generated by Abridge.

## 2023-11-23 NOTE — Progress Notes (Signed)
 Subjective  Chief Complaint  Patient presents with   Annual Exam    HPI: Sabrina White is a 38 y.o. female who presents to [facility] [place] today for a Female Wellness Visit.  She also has the concerns and/or needs as listed above in the chief complaint. These will be addressed in addition to the Health Maintenance Visit.   Wellness Visit: annual visit with health maintenance review and exam HM: screens are current. Doing well. Kids are well. Imms: eligible for flu shot today. Healthy lifestyle Chronic disease management visit and/or acute problem visit: Discussed the use of AI scribe software for clinical note transcription with the patient, who gave verbal consent to proceed.  History of Present Illness Sabrina White is a 38 year old female who presents for an annual physical exam.  Weight management and appetite suppression, PCOS - Currently using semaglutide , prescribed by her provider but obtained through an online service due to insurance issues - Pays approximately $300 per month for semaglutide , including a membership fee - Semaglutide  has been effective in reducing 'food noise' and suppressing appetite - Current weight is 223 pounds, decreased from a previous weight of 230 pounds - History of bariatric surgery  Anxiety and mood stability - Takes sertraline  for anxiety with stable mood - Occasionally takes half a Xanax  for persistent ruminating thoughts, which helps manage anxiety and improve sleep  Nutritional supplementation for vitamin B12 D and iron  deficiencies - Takes a chewable vitamin supplement nightly, containing iron , B12, and vitamin D  - Previously inconsistent with supplement use last year, but now adheres regularly after finding a more palatable option  Eczematous otitis externa - History of eczema in the ear canals, described as 'tricky ears'  Cervical lymphadenopathy resolved.  No recent strep infections.   Assessment   1. Encounter for well adult exam with abnormal findings   2. Need for influenza vaccination   3. GAD (generalized anxiety disorder)   4. 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 PCOS (polycystic ovarian syndrome)   6. Iron  deficiency   7. Vitamin B12 deficiency   8. Vitamin D  deficiency   9. Morbid obesity [facility])      Plan  Female Wellness Visit: Age appropriate Health Maintenance and Prevention measures were discussed with patient. Included topics are cancer screening recommendations, ways to keep healthy (see AVS) including dietary and exercise recommendations, regular eye and dental care, use of seat belts, and avoidance of moderate alcohol use and tobacco use.  Screens are current BMI: discussed patient's BMI and encouraged positive lifestyle modifications to help get to or maintain a target BMI. HM needs and immunizations were addressed and ordered. See below for orders. See HM and immunization section for updates.  Flu shot updated today Routine labs and screening tests ordered including cmp, cbc and lipids where appropriate. Discussed recommendations regarding Vit D and calcium  supplementation (see AVS)  Chronic disease f/u and/or acute problem visit: (deemed necessary to be done in addition to the wellness visit): Assessment and Plan Assessment & Plan History of morbid obesity, status post bariatric surgery Weight management post-bariatric surgery with semaglutide  is effective. Current weight is 223 lbs. Monthly cost of semaglutide  is manageable. - Continue semaglutide  for weight management.  Titrate dose up for further weight management strategies.  Iron , vitamin D , and B group vitamin deficiencies, status post bariatric surgery Adherent to multivitamin regimen with iron , B12, and D. - Recheck levels of iron , vitamin D , and B12.  Generalized  anxiety disorder Well-controlled on sertraline .  PCOS: Suspect better metabolic functioning on GLP-1.  Check insulin and  C-peptide     Follow up: 1 year for complete physical  Orders Placed This Encounter  Procedures   Flu vaccine trivalent PF, 6mos and older(Flulaval,Afluria,Fluarix,Fluzone)   TSH   VITAMIN D  25 Hydroxy (Vit-D Deficiency, Fractures)   CBC with Differential/Platelet   Comprehensive metabolic panel with GFR   Lipid panel   Vitamin B12   Hemoglobin A1c   Insulin and C-Peptide   Iron , TIBC and Ferritin Panel   No orders of the defined types were placed in this encounter.      Body mass index is 37.21 kg/m. 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 Paternal aunt, negative BRCA gene tested.    Morbid obesity (HCC) 06/24/2015    Priority: High   PCOS (polycystic ovarian syndrome) 04/01/2015    Priority: High   Migraine 08/19/2022    Priority: Medium    Hiatal hernia 02/12/2021    Priority: Medium     repaired 01/13/2021    History of classical cesarean section 07/27/2019    Priority: Medium    History of maternal deep vein thrombosis (DVT) 05/10/2019    Priority: Medium    Vitamin B12 deficiency 11/19/2022    Priority: Low   Iron  deficiency 11/19/2022    Priority: Low   Seasonal allergic rhinitis due to pollen 11/17/2022    Priority: Low   Rhus dermatitis 11/17/2022    Priority: Low    Severely  allergic    Vitamin D  deficiency 10/27/2019    Priority: Low   Eczema of external ear 07/14/2018    Priority: Low    PENTA in [place], oil based steroid suspension as needed    Health Maintenance  Topic Date Due   Hepatitis B Vaccines 19-59 Average Risk (1 of 3 - 19+ 3-dose series) Never done   HPV VACCINES (1 - Risk 3-dose SCDM series) Never done   COVID-19 Vaccine (3 - Pfizer risk series) 12/09/2023 (Originally 03/13/2020)   Cervical Cancer Screening (HPV/Pap Cotest)  03/20/2027   DTaP/Tdap/Td (3 - Td or Tdap) 03/21/2029   Influenza Vaccine  Completed   Hepatitis C Screening  Completed   HIV Screening  Completed   Pneumococcal Vaccine  Aged Out   Meningococcal B Vaccine  Aged Out   Immunization History  Administered Date(s) Administered   Influenza, Seasonal, Injecte, Preservative Fre 01/24/2015, 11/17/2022, 11/23/2023   Influenza,inj,Quad PF,6+ Mos 10/21/2017, 12/06/2018, 10/27/2019, 02/12/2021   Influenza-Unspecified 12/24/2016   Moderna SARS-COV2 Booster Vaccination 02/14/2020   PFIZER(Purple Top)SARS-COV-2 Vaccination 05/21/2019, 06/11/2019   Tdap 04/28/2010, 03/22/2019   We updated and reviewed the patient's past history in detail and it is  documented below. Allergies: Patient  reports current alcohol use. 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 Past Surgical History Patient  has a past surgical history that includes Cesarean section (N/A, 05/06/2019); Esophagogastroduodenoscopy endoscopy (11/19/2020); Gastric Roux-En-Y (N/A, 01/13/2021); and Hiatal hernia repair (N/A, 01/13/2021). Social History   Socioeconomic History   Marital status: Married     Spouse name: Not on file   Number of children: Not on file   Years of education: Not on file   Highest education level: Master's degree (e.g., MA, MS, MEng, MEd, MSW, MBA)  Occupational History    Employer: [employer]  Tobacco Use   Smoking status: Never   Smokeless tobacco: Never  Vaping Use   Vaping status: Never Used  Substance and Sexual Activity   Alcohol use: Yes    Comment: occasional   Drug use: No   Sexual activity: Yes  Other Topics Concern   Not on file  Social History Narrative   Not on file   Social Drivers of Health   Financial Resource Strain: Low Risk  (06/20/2023)   Overall Financial Resource Strain (CARDIA)    Difficulty of Paying Living Expenses: Not hard at all  Food Insecurity: No Food Insecurity (06/20/2023)   Hunger Vital Sign    Worried About Running Out of Food in the Last Year: Never true    Ran Out of Food in the Last Year: Never true  Transportation Needs: No Transportation Needs (06/20/2023)   PRAPARE - Administrator, Civil Service (Medical): No    Lack of Transportation (Non-Medical): No  Physical Activity: Insufficiently Active (06/20/2023)   Exercise Vital Sign    Days of Exercise per Week: 3 days    Minutes of Exercise per Session: 40 min  Stress: No Stress Concern Present (06/20/2023)   [employer] of Occupational Health - Occupational Stress Questionnaire    Feeling of Stress : Only a little  Social Connections: Socially Integrated (06/20/2023)   Social Connection and Isolation Panel    Frequency of Communication with Friends and Family: More than three times a week    Frequency of Social Gatherings with Friends and Family: Twice a week    Attends Religious Services: More than 4 times per year    Active Member of [employer] or Organizations: Yes    Attends Engineer, structural: More than 4 times per year    Marital Status: Married   Family History  Problem Relation Age of Onset   Breast cancer Mother     Hypertension Father    Breast cancer Paternal Aunt    Breast cancer Maternal Grandmother    Colon cancer Neg Hx    Pancreatic cancer Neg Hx    Esophageal cancer Neg Hx    Liver cancer Neg Hx    Stomach cancer Neg Hx    Colon polyps Neg Hx     Review of Systems: Constitutional: negative for fever or malaise Ophthalmic: negative for photophobia, double vision or loss of vision Cardiovascular: negative for chest pain, dyspnea on exertion, or new LE swelling Respiratory: negative for SOB or persistent cough Gastrointestinal: negative for abdominal pain, change in bowel habits or melena Genitourinary: negative for dysuria or gross hematuria, no abnormal uterine bleeding or  disharge Musculoskeletal: negative for new gait disturbance or muscular weakness Integumentary: negative for new or persistent rashes, no breast lumps Neurological: negative for TIA or stroke symptoms Psychiatric: negative for SI or delusions Allergic/Immunologic: negative for hives  Patient Care Team    Relationship Specialty Notifications Start End  Jodie Lavern CROME, MD PCP - General Family Medicine  04/05/17   Kandyce Sor, MD Consulting Physician Obstetrics and Gynecology  11/17/22   Okey Burns, MD Consulting Physician Otolaryngology  06/21/23     Objective  Vitals: BP 117/87   Pulse 73   Temp 98.1 F (36.7 C)   Ht 5' 5 (1.651 m)   Wt 223 lb 9.6 oz (101.4 kg)   SpO2 97%   BMI 37.21 kg/m  General:  Well developed, well nourished, no acute distress  Psych:  Alert and orientedx3,normal mood and affect HEENT:  Normocephalic, atraumatic, non-icteric sclera, PERRL, supple neck without adenopathy, mass or thyromegaly Cardiovascular:  Normal S1, S2, RRR without gallop, rub or murmur Respiratory:  Good breath sounds bilaterally, CTAB with normal respiratory effort Gastrointestinal: normal bowel sounds, soft, non-tender, no noted masses. No HSM MSK: no deformities, contusions. Joints are without  erythema or swelling.  Skin:  Warm, no rashes or suspicious lesions noted Neurologic:    Mental status is normal. Gross motor and sensory exams are normal. Normal gait. No tremor   Commons side effects, risks, benefits, and alternatives for medications and treatment plan prescribed today were discussed, and the patient expressed understanding of the given instructions. Patient is instructed to call or message via MyChart if he/she has any questions or concerns regarding our treatment plan. No barriers to understanding were identified. We discussed Red Flag symptoms and signs in detail. Patient expressed understanding regarding what to do in case of urgent or emergency type symptoms.  Medication list was reconciled, printed and provided to the patient in AVS. Patient instructions and summary information was reviewed with the patient as documented in the AVS. This note was prepared with assistance of Dragon voice recognition software. Occasional wrong-word or sound-a-like substitutions may have occurred due to the inherent limitations of voice recognition software .

## 2023-11-24 LAB — INSULIN AND C-PEPTIDE, SERUM
C-Peptide: 3.3 ng/mL (ref 1.1–4.4)
INSULIN: 6.8 u[IU]/mL (ref 2.6–24.9)

## 2023-11-24 LAB — IRON,TIBC AND FERRITIN PANEL
%SAT: 26 % (ref 16–45)
Ferritin: 13 ng/mL — ABNORMAL LOW (ref 16–154)
Iron: 80 ug/dL (ref 40–190)
TIBC: 313 ug/dL (ref 250–450)

## 2023-11-28 ENCOUNTER — Ambulatory Visit: Payer: Self-pay | Admitting: Family Medicine

## 2023-11-28 NOTE — Progress Notes (Signed)
 See mychart note Dear Ms. Kludt, Your lab results look good overall. The only thing that needs attention is your iron  stores are drifting down again. Please continue iron  supplements or restart them. Everything else is good.  Sincerely, Dr. Jodie

## 2024-01-11 ENCOUNTER — Other Ambulatory Visit (HOSPITAL_COMMUNITY): Payer: Self-pay

## 2024-01-11 ENCOUNTER — Telehealth: Admitting: Physician Assistant

## 2024-01-11 DIAGNOSIS — B9789 Other viral agents as the cause of diseases classified elsewhere: Secondary | ICD-10-CM

## 2024-01-11 DIAGNOSIS — J019 Acute sinusitis, unspecified: Secondary | ICD-10-CM | POA: Diagnosis not present

## 2024-01-11 MED ORDER — PREDNISONE 10 MG (21) PO TBPK
ORAL_TABLET | ORAL | 0 refills | Status: DC
  Filled 2024-01-11: qty 21, 6d supply, fill #0

## 2024-01-11 NOTE — Progress Notes (Signed)
 We are sorry that you are not feeling well.  Here is how we plan to help!  Based on what you have shared with me it looks like you have sinusitis.  Sinusitis is inflammation and infection in the sinus cavities of the head.  Based on your presentation I believe you most likely have Acute Viral Sinusitis.This is an infection most likely caused by a virus. There is not specific treatment for viral sinusitis other than to help you with the symptoms until the infection runs its course.  You may use an oral decongestant such as Mucinex  D or if you have glaucoma or high blood pressure use plain Mucinex . Saline nasal spray help and can safely be used as often as needed for congestion. Continue your Flonase  nasal spray once daily.  I have prescribed a prednisone  pack to reduce inflammation in the sinuses which will help with pain and pressure, and hopefully help you recover faster.  Some authorities believe that zinc sprays or the use of Echinacea may shorten the course of your symptoms.  Sinus infections are not as easily transmitted as other respiratory infection, however we still recommend that you avoid close contact with loved ones, especially the very young and elderly.  Remember to wash your hands thoroughly throughout the day as this is the number one way to prevent the spread of infection!  Home Care: Only take medications as instructed by your medical team. Do not take these medications with alcohol. A steam or ultrasonic humidifier can help congestion.  You can place a towel over your head and breathe in the steam from hot water  coming from a faucet. Avoid close contacts especially the very young and the elderly. Cover your mouth when you cough or sneeze. Always remember to wash your hands.  Get Help Right Away If: You develop worsening fever or sinus pain. You develop a severe head ache or visual changes. Your symptoms persist after you have completed your treatment plan.  Make sure  you Understand these instructions. Will watch your condition. Will get help right away if you are not doing well or get worse.  Your e-visit answers were reviewed by a board certified advanced clinical practitioner to complete your personal care plan.  Depending on the condition, your plan could have included both over the counter or prescription medications.  If there is a problem please reply  once you have received a response from your provider.  Your safety is important to us .  If you have drug allergies check your prescription carefully.    You can use MyChart to ask questions about today's visit, request a non-urgent call back, or ask for a work or school excuse for 24 hours related to this e-Visit. If it has been greater than 24 hours you will need to follow up with your provider, or enter a new e-Visit to address those concerns.  You will get an e-mail in the next two days asking about your experience.  I hope that your e-visit has been valuable and will speed your recovery. Thank you for using e-visits.  I have spent 5 minutes in review of e-visit questionnaire, review and updating patient chart, medical decision making and response to patient.   Elsie Velma Lunger, PA-C

## 2024-02-25 ENCOUNTER — Telehealth: Admitting: Physician Assistant

## 2024-02-25 ENCOUNTER — Other Ambulatory Visit (HOSPITAL_COMMUNITY): Payer: Self-pay

## 2024-02-25 DIAGNOSIS — T3695XA Adverse effect of unspecified systemic antibiotic, initial encounter: Secondary | ICD-10-CM

## 2024-02-25 DIAGNOSIS — J019 Acute sinusitis, unspecified: Secondary | ICD-10-CM | POA: Diagnosis not present

## 2024-02-25 DIAGNOSIS — B9689 Other specified bacterial agents as the cause of diseases classified elsewhere: Secondary | ICD-10-CM

## 2024-02-25 DIAGNOSIS — B379 Candidiasis, unspecified: Secondary | ICD-10-CM | POA: Diagnosis not present

## 2024-02-25 MED ORDER — AMOXICILLIN-POT CLAVULANATE 875-125 MG PO TABS
1.0000 | ORAL_TABLET | Freq: Two times a day (BID) | ORAL | 0 refills | Status: DC
  Filled 2024-02-25: qty 14, 7d supply, fill #0

## 2024-02-25 MED ORDER — FLUCONAZOLE 150 MG PO TABS
150.0000 mg | ORAL_TABLET | ORAL | 0 refills | Status: DC | PRN
  Filled 2024-02-25: qty 3, 9d supply, fill #0

## 2024-02-25 NOTE — Patient Instructions (Signed)
 " Sabrina White, thank you for joining [name], PA-C for today's virtual visit.  While this provider is not your primary care provider (PCP), if your PCP is located in our provider database this encounter information will be shared with them immediately following your visit.   A [facility] MyChart account gives you access to today's visit and all your visits, tests, and labs performed at [facility]  click here if you don't have a [facility] MyChart account or go to [email or web address]  Consent: (Patient) Sabrina White provided verbal consent for this virtual visit at the beginning of the encounter.  Current Medications:  Current Outpatient Medications:    amoxicillin -clavulanate (AUGMENTIN ) 875-125 MG tablet, Take 1 tablet by mouth 2 (two) times daily., Disp: 14 tablet, Rfl: 0   fluconazole  (DIFLUCAN ) 150 MG tablet, Take 1 tablet (150 mg total) by mouth every 3 (three) days as needed., Disp: 3 tablet, Rfl: 0   ALPRAZolam  (XANAX ) 0.5 MG tablet, Take 1 tablet (0.5 mg total) by mouth daily as needed for anxiety., Disp: 30 tablet, Rfl: 0   cetirizine  (ZYRTEC ) 10 MG tablet, Take 1 tablet (10 mg total) by mouth daily., Disp: 30 tablet, Rfl: 11   predniSONE  (STERAPRED UNI-PAK 21 TAB) 10 MG (21) TBPK tablet, Take following package directions, Disp: 21 tablet, Rfl: 0   sertraline  (ZOLOFT ) 100 MG tablet, Take 1 tablet (100 mg total) by mouth daily., Disp: 90 tablet, Rfl: 3   WEGOVY  0.25 MG/0.5ML SOAJ, Inject 0.25 mg into the skin once a week., Disp: 2 mL, Rfl: 0   Medications ordered in this encounter:  Meds ordered this encounter  Medications   amoxicillin -clavulanate (AUGMENTIN ) 875-125 MG tablet    Sig: Take 1 tablet by mouth 2 (two) times daily.    Dispense:  14 tablet    Refill:  0    Supervising Provider:   LAMPTEY, PHILIP O [[id number]]   fluconazole  (DIFLUCAN ) 150 MG tablet    Sig: Take 1 tablet (150 mg total) by mouth every 3 (three) days as needed.     Dispense:  3 tablet    Refill:  0    Supervising Provider:   LAMPTEY, PHILIP O [[id number]]     *If you need refills on other medications prior to your next appointment, please contact your pharmacy*  Follow-Up: Call back or seek an in-person evaluation if the symptoms worsen or if the condition fails to improve as anticipated.  [facility] [phone]  Other Instructions Sinus Infection, Adult A sinus infection, also called sinusitis, is inflammation of your sinuses. Sinuses are hollow spaces in the bones around your face. Your sinuses are located: Around your eyes. In the middle of your forehead. Behind your nose. In your cheekbones. Mucus normally drains out of your sinuses. When your nasal tissues become inflamed or swollen, mucus can become trapped or blocked. This allows bacteria, viruses, and fungi to grow, which leads to infection. Most infections of the sinuses are caused by a virus. A sinus infection can develop quickly. It can last for up to 4 weeks (acute) or for more than 12 weeks (chronic). A sinus infection often develops after a cold. What are the causes? This condition is caused by anything that creates swelling in the sinuses or stops mucus from draining. This includes: Allergies. Asthma. Infection from bacteria or viruses. Deformities or blockages in your nose or sinuses. Abnormal growths in the nose (nasal polyps). Pollutants, such as chemicals or irritants in the air.  Infection from fungi. This is rare. What increases the risk? You are more likely to develop this condition if you: Have a weak body defense system (immune system). Do a lot of swimming or diving. Overuse nasal sprays. Smoke. What are the signs or symptoms? The main symptoms of this condition are pain and a feeling of pressure around the affected sinuses. Other symptoms include: Stuffy nose or congestion that makes it difficult to breathe through your nose. Thick yellow or  greenish drainage from your nose. Tenderness, swelling, and warmth over the affected sinuses. A cough that may get worse at night. Decreased sense of smell and taste. Extra mucus that collects in the throat or the back of the nose (postnasal drip) causing a sore throat or bad breath. Tiredness (fatigue). Fever. How is this diagnosed? This condition is diagnosed based on: Your symptoms. Your medical history. A physical exam. Tests to find out if your condition is acute or chronic. This may include: Checking your nose for nasal polyps. Viewing your sinuses using a device that has a light (endoscope). Testing for allergies or bacteria. Imaging tests, such as an MRI or CT scan. In rare cases, a bone biopsy may be done to rule out more serious types of fungal sinus disease. How is this treated? Treatment for a sinus infection depends on the cause and whether your condition is chronic or acute. If caused by a virus, your symptoms should go away on their own within 10 days. You may be given medicines to relieve symptoms. They include: Medicines that shrink swollen nasal passages (decongestants). A spray that eases inflammation of the nostrils (topical intranasal corticosteroids). Rinses that help get rid of thick mucus in your nose (nasal saline washes). Medicines that treat allergies (antihistamines). Over-the-counter pain relievers. If caused by bacteria, your health care provider may recommend waiting to see if your symptoms improve. Most bacterial infections will get better without antibiotic medicine. You may be given antibiotics if you have: A severe infection. A weak immune system. If caused by narrow nasal passages or nasal polyps, surgery may be needed. Follow these instructions at home: Medicines Take, use, or apply over-the-counter and prescription medicines only as told by your health care provider. These may include nasal sprays. If you were prescribed an antibiotic medicine,  take it as told by your health care provider. Do not stop taking the antibiotic even if you start to feel better. Hydrate and humidify  Drink enough fluid to keep your urine pale yellow. Staying hydrated will help to thin your mucus. Use a cool mist humidifier to keep the humidity level in your home above 50%. Inhale steam for 10-15 minutes, 3-4 times a day, or as told by your health care provider. You can do this in the bathroom while a hot shower is running. Limit your exposure to cool or dry air. Rest Rest as much as possible. Sleep with your head raised (elevated). Make sure you get enough sleep each night. General instructions  Apply a warm, moist washcloth to your face 3-4 times a day or as told by your health care provider. This will help with discomfort. Use nasal saline washes as often as told by your health care provider. Wash your hands often with soap and water  to reduce your exposure to germs. If soap and water  are not available, use hand sanitizer. Do not smoke. Avoid being around people who are smoking (secondhand smoke). Keep all follow-up visits. This is important. Contact a health care provider if: You have  a fever. Your symptoms get worse. Your symptoms do not improve within 10 days. Get help right away if: You have a severe headache. You have persistent vomiting. You have severe pain or swelling around your face or eyes. You have vision problems. You develop confusion. Your neck is stiff. You have trouble breathing. These symptoms may be an emergency. Get help right away. Call 911. Do not wait to see if the symptoms will go away. Do not drive yourself to the hospital. Summary A sinus infection is soreness and inflammation of your sinuses. Sinuses are hollow spaces in the bones around your face. This condition is caused by nasal tissues that become inflamed or swollen. The swelling traps or blocks the flow of mucus. This allows bacteria, viruses, and fungi to  grow, which leads to infection. If you were prescribed an antibiotic medicine, take it as told by your health care provider. Do not stop taking the antibiotic even if you start to feel better. Keep all follow-up visits. This is important. This information is not intended to replace advice given to you by your health care provider. Make sure you discuss any questions you have with your health care provider. Document Revised: 01/14/2021 Document Reviewed: 01/14/2021 Elsevier Patient Education  2024 Elsevier [employer]   If you have been instructed to have an in-person evaluation today at a local Urgent Care facility, please use the link below. It will take you to a list of all of our available [facility], including address, phone number and hours of operation. Please do not delay care.  [facility]  If you or a family member do not have a primary care provider, use the link below to schedule a visit and establish care. When you choose a [facility] primary care physician or advanced practice provider, you gain a long-term partner in health. Find a Primary Care Provider  Learn more about [facility]'s in-office and virtual care options: [facility] - Get Care Now "

## 2024-02-25 NOTE — Progress Notes (Signed)
 " Virtual Visit Consent   Sabrina White, you are scheduled for a virtual visit with a [facility] provider today. Just as with appointments in the office, your consent must be obtained to participate. Your consent will be active for this visit and any virtual visit you may have with one of our providers in the next 365 days. If you have a MyChart account, a copy of this consent can be sent to you electronically.  As this is a virtual visit, video technology does not allow for your provider to perform a traditional examination. This may limit your provider's ability to fully assess your condition. If your provider identifies any concerns that need to be evaluated in person or the need to arrange testing (such as labs, EKG, etc.), we will make arrangements to do so. Although advances in technology are sophisticated, we cannot ensure that it will always work on either your end or our end. If the connection with a video visit is poor, the visit may have to be switched to a telephone visit. With either a video or telephone visit, we are not always able to ensure that we have a secure connection.  By engaging in this virtual visit, you consent to the provision of healthcare and authorize for your insurance to be billed (if applicable) for the services provided during this visit. Depending on your insurance coverage, you may receive a charge related to this service.  I need to obtain your verbal consent now. Are you willing to proceed with your visit today? Sabrina White has provided verbal consent on 02/25/2024 for a virtual visit (video or telephone). [name], PA-C  Date: 02/25/2024 12:22 PM   Virtual Visit via Video Note   I, [name], connected with  Sabrina White  ([id number], Oct 12, 1985) on 02/25/2024 at 12:15 PM EST by a video-enabled telemedicine application and verified that I am speaking with the correct person using two identifiers.  Location: Patient: Virtual Visit  Location Patient: Home Provider: Virtual Visit Location Provider: Home Office   I discussed the limitations of evaluation and management by telemedicine and the availability of in person appointments. The patient expressed understanding and agreed to proceed.    History of Present Illness: Sabrina White is a 39 y.o. who identifies as a female who was assigned female at birth, and is being seen today for sinus pain and congestion following flu-like symptoms.  HPI: URI  This is a new problem. The current episode started in the past 7 days (Flu-like symptoms started 02/17/24, now with more sinus pain and congestion). The problem has been gradually worsening. There has been no fever. Associated symptoms include congestion (thick, dark green and yellow, changed over last 24 hours), coughing, headaches, a plugged ear sensation and sinus pain (worsened over the last 24 hours). Pertinent negatives include no diarrhea, ear pain, nausea, rhinorrhea, sore throat, vomiting or wheezing. Treatments tried: mucinex , dayquil.     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  Resting comfortably at home.  Head is normocephalic, atraumatic.  No labored breathing.  Speech is clear and coherent with logical content.  Patient is alert and oriented at baseline.    Assessment and Plan: 1. Acute bacterial sinusitis (Primary) - amoxicillin -clavulanate (AUGMENTIN ) 875-125 MG tablet; Take 1 tablet by mouth 2 (two) times daily.  Dispense: 14 tablet; Refill: 0  2. Antibiotic-induced yeast infection - fluconazole  (DIFLUCAN ) 150 MG tablet; Take 1 tablet (150 mg total) by mouth every 3 (three) days as needed.  Dispense: 3 tablet; Refill: 0  - Worsening symptoms that have not responded to OTC medications.  - Will give Augmentin  - Continue allergy medications.  - Steam and humidifier can help - Stay well hydrated and get plenty of rest.  - Diflucan  given as prophylaxis as patient tends to get vaginal yeast infections with antibiotic use - Seek in person evaluation if no symptom improvement or if symptoms worsen  Follow Up Instructions: I discussed the assessment and treatment plan with the patient. The patient was provided an opportunity to ask questions and all were answered. The patient agreed with the plan and demonstrated an understanding of the instructions.  A copy of instructions were sent to the patient via MyChart unless otherwise noted below.    The patient was advised to call back or seek an in-person evaluation if the symptoms worsen or if the condition fails to improve as anticipated.    [name], PA-C     [1]  Allergies Allergen Reactions   Codeine Other (See Comments)    Other reaction(s): Dizziness   Nifedipine  Nausea And Vomiting    Caused severe headache.   Raspberry Other (See Comments)    Causes blisters on lips  [2]  Current Outpatient Medications:    amoxicillin -clavulanate (AUGMENTIN ) 875-125 MG tablet, Take 1 tablet by mouth 2 (two) times daily., Disp: 14 tablet, Rfl: 0   fluconazole  (DIFLUCAN ) 150 MG  tablet, Take 1 tablet (150 mg total) by mouth every 3 (three) days as needed., Disp: 3 tablet, Rfl: 0   ALPRAZolam  (XANAX ) 0.5 MG tablet, Take 1 tablet (0.5 mg total) by mouth daily as needed for anxiety., Disp: 30 tablet, Rfl: 0   cetirizine  (ZYRTEC ) 10 MG tablet, Take 1 tablet (10 mg total) by mouth daily., Disp: 30 tablet, Rfl: 11   predniSONE  (STERAPRED UNI-PAK 21 TAB) 10 MG (21) TBPK tablet, Take following package directions, Disp: 21 tablet, Rfl: 0   sertraline  (ZOLOFT ) 100 MG tablet, Take 1 tablet (100 mg total) by mouth daily., Disp: 90 tablet, Rfl: 3   WEGOVY  0.25 MG/0.5ML SOAJ, Inject 0.25 mg into the skin once a week., Disp: 2 mL, Rfl: 0  "

## 2024-03-13 ENCOUNTER — Other Ambulatory Visit: Payer: Self-pay | Admitting: Family Medicine

## 2024-03-14 NOTE — Telephone Encounter (Signed)
 11/23/2023 LOV  08/11/2021 fill date  30/1 refill

## 2024-03-22 ENCOUNTER — Ambulatory Visit: Payer: Self-pay

## 2024-03-22 NOTE — Telephone Encounter (Signed)
 FYI Only or Action Required?: FYI only for provider: appointment scheduled on 03/23/24.  Patient was last seen in primary care on 02/25/2024 by Vivienne Delon HERO, PA-C.  Called Nurse Triage reporting Nasal Congestion and Facial Pain.  Symptoms began several days ago.  Interventions attempted: OTC medications: Sudafed.  Symptoms are: gradually worsening.  Triage Disposition: See PCP When Office is Open (Within 3 Days)  Patient/caregiver understands and will follow disposition?: Yes  Summary: facial pressure, infection   Reason for Triage: Pt reports, she has had sinus infection in a span of 2 months, not feeling any better, not clearing up.  Symptoms are having headache, facial soreness, colored thick mucous, with bloody mucous at times.  Pt requesting an appt for evaluation.     Reason for Disposition  [1] Using nasal washes and pain medicine > 24 hours AND [2] sinus pain (around cheekbone or eye) persists  Answer Assessment - Initial Assessment Questions Patient states that she had recent video visit on 1/2  and was diagnosed with a sinus infection. She was prescribed Fluconazole  and  Amoxicillin -Pot Clavulanate 875-125 MG. She states that symptoms improved, but never fully went away. They have started to worsen again over the last few days. She is using Neti pot and taking Sudafed with some relief and reports discolored mucous. Office visit advised.   1. LOCATION: Where does it hurt?      Headache, face    2. ONSET: When did the sinus pain start?  (e.g., hours, days)      Had recent sinus infection and symptoms improved some, but worsened again over the last few days  3. SEVERITY: How bad is the pain?   (Scale 0-10; or none, mild, moderate or severe)     3-4/10  4. RECURRENT SYMPTOM: Have you ever had sinus problems before? If Yes, ask: When was the last time? and What happened that time?      Yes, diagnosed with sinus infection a few weeks ago  5. NASAL  CONGESTION: Is the nose blocked? If Yes, ask: Can you open it or must you breathe through your mouth?     Able to clear nose  6. NASAL DISCHARGE: Do you have discharge from your nose? If so ask, What color?     Yes  7. FEVER: Do you have a fever? If Yes, ask: What is it, how was it measured, and when did it start?      No  8. OTHER SYMPTOMS: Do you have any other symptoms? (e.g., sore throat, cough, earache, difficulty breathing)     Denies any other symptoms at this time  9. PREGNANCY: Is there any chance you are pregnant? When was your last menstrual period?     Unknown  Protocols used: Sinus Pain or Congestion-A-AH

## 2024-03-23 ENCOUNTER — Other Ambulatory Visit (HOSPITAL_COMMUNITY): Payer: Self-pay

## 2024-03-23 ENCOUNTER — Ambulatory Visit: Admitting: Family Medicine

## 2024-03-23 ENCOUNTER — Encounter: Payer: Self-pay | Admitting: Family Medicine

## 2024-03-23 VITALS — BP 102/80 | HR 81 | Temp 97.7°F | Ht 65.0 in | Wt 220.2 lb

## 2024-03-23 DIAGNOSIS — J309 Allergic rhinitis, unspecified: Secondary | ICD-10-CM

## 2024-03-23 DIAGNOSIS — J324 Chronic pansinusitis: Secondary | ICD-10-CM | POA: Diagnosis not present

## 2024-03-23 MED ORDER — FLUCONAZOLE 150 MG PO TABS
ORAL_TABLET | ORAL | 0 refills | Status: AC
  Filled 2024-03-23: qty 5, 30d supply, fill #0

## 2024-03-23 MED ORDER — DOXYCYCLINE HYCLATE 100 MG PO TABS
ORAL_TABLET | ORAL | 0 refills | Status: AC
  Filled 2024-03-23: qty 22, 21d supply, fill #0

## 2024-03-23 MED ORDER — FLUTICASONE PROPIONATE 50 MCG/ACT NA SUSP
1.0000 | Freq: Every day | NASAL | 6 refills | Status: AC
  Filled 2024-03-23: qty 16, 60d supply, fill #0

## 2024-03-23 NOTE — Progress Notes (Signed)
 "  Subjective  CC:  Chief Complaint  Patient presents with   Sinus Problem    Pt stated that she has been dealing with sinus pressure since Thanksgiving and then came back around christmas and it is still lingering     HPI: Sabrina White is a 39 y.o. female who presents to the office today to address the problems listed above in the chief complaint. Discussed the use of AI scribe software for clinical note transcription with the patient, who gave verbal consent to proceed.  History of Present Illness Sabrina White is a 39 year old female with a history of chronic sinus congestion and recurrent sinus infections who presents with persistent nasal congestion and sinus pressure.  Nasal congestion and sinus pressure - Chronic nasal congestion and sinus pressure, persistent since November - Sensation of something in the sinuses that cannot be cleared - Symptoms fluctuate, with temporary relief from medications - Increased pressure and yellow nasal discharge with blood streaks two days ago - Neti pot and Sudafed provide some relief for pressure and pain, but symptoms persist - Flonase  used intermittently without noticeable improvement - Zyrtec  used seasonally, but not very effective  Recurrent sinus infections - History of recurrent sinus infections, with a notable episode starting around Thanksgiving - E-visit on November 18th resulted in prednisone  prescription, which provided immediate relief from sinus pressure - Suspected flu on Christmas night, followed by a sinus infection - Prescribed a large antibiotic pill in January, which temporarily cleared nasal discharge, but nasal congestion persisted - Diflucan  previously used to prevent yeast infections while on antibiotics  Oropharyngeal symptoms - History of throat stones - No recent strep throat infections  Allergic symptoms and family history - Son has environmental allergies and takes Xyzal nightly   Assessment  1.  Chronic pansinusitis   2. Chronic allergic rhinitis      Plan  Assessment and Plan Assessment & Plan Chronic sinusitis Persistent nasal congestion and yellow nasal discharge with blood streaks, indicating possible bacterial infection. Previous prednisone  provided temporary relief. Current symptoms suggest a subacute infection with underlying allergies contributing to congestion. Differential includes chronic sinusitis versus allergy-related symptoms. She prefers to avoid CT scan at this time. - Prescribed doxycycline  daily for 3 weeks - Provided Diflucan  for yeast infection prophylaxis, to be used once a week or as needed. - Advised daily use of Flonase . - Advised against using Zyrtec  during antibiotic treatment due to potential for thickening mucus. - Will consider CT scan or ENT referral if symptoms persist after antibiotic course.  Allergic rhinitis Contributing to nasal congestion and clear nasal discharge. Previous use of Zyrtec  and Flonase  with limited effect. Symptoms may be exacerbated by underlying allergies, especially during winter and spring. - Advised daily use of Flonase  after completing antibiotic course. - Recommended starting Zyrtec  daily after completing antibiotic course to manage allergies through spring. - Will consider CT scan or ENT referral if symptoms persist.    Follow up: prn No orders of the defined types were placed in this encounter.  Meds ordered this encounter  Medications   doxycycline  (VIBRA -TABS) 100 MG tablet    Sig: Take 2 tablets (200 mg total) by mouth daily for 1 day, THEN 1 tablet (100 mg total) daily for 20 days.    Dispense:  22 tablet    Refill:  0   fluconazole  (DIFLUCAN ) 150 MG tablet    Sig: Take one tablet weekly for 3 weeks, then as needed    Dispense:  5 tablet    Refill:  0   fluticasone  (FLONASE ) 50 MCG/ACT nasal spray    Sig: Place 1 spray into both nostrils daily.    Dispense:  16 g    Refill:  6     I reviewed the  patients updated PMH, FH, and SocHx.  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 04/30/2021    Priority: High   Panic attacks 10/27/2019    Priority: High   Family history of breast cancer in mother 04/05/2017    Priority: High   Morbid obesity (HCC) 06/24/2015    Priority: High   PCOS (polycystic ovarian syndrome) 04/01/2015    Priority: High   Migraine 08/19/2022    Priority: Medium    Hiatal hernia 02/12/2021    Priority: Medium    History of classical cesarean section 07/27/2019    Priority: Medium    History of maternal deep vein thrombosis (DVT) 05/10/2019    Priority: Medium    Vitamin B12 deficiency 11/19/2022    Priority: Low   Iron  deficiency 11/19/2022    Priority: Low   Seasonal allergic rhinitis due to pollen 11/17/2022    Priority: Low   Rhus dermatitis 11/17/2022    Priority: Low   Vitamin D  deficiency 10/27/2019    Priority: Low   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 Family History: Patient family history includes Breast cancer in her maternal grandmother, mother, and paternal aunt; Hypertension in her father. Social History:  Patient  reports that she has never smoked. She has never used smokeless tobacco. She reports current alcohol use. She reports that she does not use drugs.  Review of Systems: Constitutional: Negative for fever malaise or anorexia Cardiovascular: negative for chest pain Respiratory: negative for SOB or persistent cough Gastrointestinal: negative for abdominal pain  Objective  Vitals: BP 102/80   Pulse 81   Temp 97.7 F (36.5 C)   Ht 5' 5 (1.651 m)   Wt 220 lb 3.2 oz (99.9 kg)   SpO2 96%   BMI 36.64 kg/m  General: no acute distress ,  A&Ox3 HEENT: PEERL, conjunctiva normal, neck is supple Sinus ttp present Congestion present Commons side effects, risks, benefits, and alternatives for medications and treatment plan prescribed today were discussed, and the patient expressed understanding of the given instructions. Patient is instructed to call or message via MyChart if he/she has any questions or concerns regarding our treatment plan. No barriers to understanding were identified. We discussed Red Flag symptoms and signs in detail. Patient expressed understanding regarding what to do in case of urgent or emergency type symptoms.  Medication list was reconciled, printed and provided to the patient in AVS. Patient instructions and summary information was reviewed with the patient as documented in the AVS. This note was prepared with assistance of Dragon voice recognition software. Occasional wrong-word or sound-a-like substitutions may have occurred due to the inherent limitations of voice recognition software     [1]  Current Meds  Medication Sig   ALPRAZolam  (XANAX ) 0.5 MG tablet TAKE 1 TABLET(0.5 MG) BY MOUTH DAILY AS NEEDED FOR ANXIETY   doxycycline  (VIBRA -TABS) 100 MG tablet Take 2 tablets (200 mg total) by mouth daily for 1 day,  THEN 1 tablet (100 mg total) daily for 20 days.   fluconazole  (DIFLUCAN ) 150 MG tablet Take one tablet weekly for 3 weeks, then as needed   fluticasone  (FLONASE ) 50 MCG/ACT nasal spray Place 1 spray into both nostrils daily.   sertraline  (ZOLOFT ) 100 MG tablet Take 1 tablet (100 mg total) by mouth daily.   WEGOVY  0.25 MG/0.5ML SOAJ Inject 0.25 mg into the skin once a week.   "

## 2024-03-23 NOTE — Telephone Encounter (Signed)
 Appt today

## 2024-03-23 NOTE — Patient Instructions (Signed)
 Please follow up if symptoms do not improve or as needed.     VISIT SUMMARY: During your visit, we discussed your persistent nasal congestion and sinus pressure, which have been ongoing since November. You have experienced increased pressure and yellow nasal discharge with blood streaks recently. We also reviewed your history of recurrent sinus infections and allergic symptoms.  YOUR PLAN: -CHRONIC SINUSITIS: Chronic sinusitis is a condition where the sinuses become inflamed and swollen for an extended period, often due to infection or allergies. We have prescribed doxycycline  to be taken daily for three weeks to address the possible bacterial infection. Additionally, you should use Diflucan  once a week or as needed to prevent yeast infections while on antibiotics. Please use Flonase  daily to help with congestion, and avoid using Zyrtec  during the antibiotic treatment as it may thicken mucus. If your symptoms persist after completing the antibiotics, we may consider a CT scan or refer you to an ENT specialist.  -ALLERGIC RHINITIS: Allergic rhinitis is an allergic reaction that causes sneezing, congestion, and a runny nose. It can contribute to your nasal congestion and clear nasal discharge. After completing your antibiotic course, you should use Flonase  daily and start taking Zyrtec  daily to manage your allergies through the spring. If your symptoms continue, we may consider a CT scan or an ENT referral.  INSTRUCTIONS: Please follow the prescribed antibiotic regimen and use Diflucan  as needed. Use Flonase  daily and avoid Zyrtec  during the antibiotic treatment. After completing the antibiotics, start taking Zyrtec  daily. If your symptoms persist, we may need to consider further evaluation with a CT scan or an ENT referral.    Contains text generated by [employer].

## 2024-11-30 ENCOUNTER — Encounter: Admitting: Family Medicine
